# Patient Record
Sex: Female | Born: 1935
Health system: Southern US, Community
[De-identification: ages and names within clinical notes are randomized; demographics above are authoritative.]

## PROBLEM LIST (undated history)

## (undated) DIAGNOSIS — I10 Essential (primary) hypertension: Secondary | ICD-10-CM

## (undated) DIAGNOSIS — J189 Pneumonia, unspecified organism: Secondary | ICD-10-CM

## (undated) DIAGNOSIS — L509 Urticaria, unspecified: Secondary | ICD-10-CM

## (undated) DIAGNOSIS — M1990 Unspecified osteoarthritis, unspecified site: Secondary | ICD-10-CM

## (undated) DIAGNOSIS — Z8669 Personal history of other diseases of the nervous system and sense organs: Secondary | ICD-10-CM

## (undated) DIAGNOSIS — K589 Irritable bowel syndrome without diarrhea: Secondary | ICD-10-CM

## (undated) DIAGNOSIS — Z8719 Personal history of other diseases of the digestive system: Secondary | ICD-10-CM

## (undated) DIAGNOSIS — K5790 Diverticulosis of intestine, part unspecified, without perforation or abscess without bleeding: Secondary | ICD-10-CM

## (undated) DIAGNOSIS — D649 Anemia, unspecified: Secondary | ICD-10-CM

## (undated) DIAGNOSIS — K2270 Barrett's esophagus without dysplasia: Secondary | ICD-10-CM

## (undated) DIAGNOSIS — F419 Anxiety disorder, unspecified: Secondary | ICD-10-CM

## (undated) DIAGNOSIS — K222 Esophageal obstruction: Secondary | ICD-10-CM

## (undated) DIAGNOSIS — K219 Gastro-esophageal reflux disease without esophagitis: Secondary | ICD-10-CM

## (undated) DIAGNOSIS — Z9889 Other specified postprocedural states: Secondary | ICD-10-CM

## (undated) DIAGNOSIS — E785 Hyperlipidemia, unspecified: Secondary | ICD-10-CM

## (undated) DIAGNOSIS — N809 Endometriosis, unspecified: Secondary | ICD-10-CM

## (undated) DIAGNOSIS — K635 Polyp of colon: Secondary | ICD-10-CM

## (undated) HISTORY — DX: Unspecified osteoarthritis, unspecified site: M19.90

## (undated) HISTORY — DX: Hyperlipidemia, unspecified: E78.5

## (undated) HISTORY — PX: CATARACT EXTRACTION W/ INTRAOCULAR LENS IMPLANT: SHX1309

## (undated) HISTORY — DX: Urticaria, unspecified: L50.9

## (undated) HISTORY — PX: TOTAL ABDOMINAL HYSTERECTOMY: SHX209

## (undated) HISTORY — DX: Esophageal obstruction: K22.2

## (undated) HISTORY — PX: TONSILLECTOMY: SUR1361

## (undated) HISTORY — PX: COLONOSCOPY: SHX174

## (undated) HISTORY — DX: Gastro-esophageal reflux disease without esophagitis: K21.9

## (undated) HISTORY — PX: TONSILLECTOMY: SHX5217

## (undated) HISTORY — PX: BREAST BIOPSY: SHX20

## (undated) HISTORY — DX: Irritable bowel syndrome, unspecified: K58.9

## (undated) HISTORY — DX: Endometriosis, unspecified: N80.9

## (undated) HISTORY — PX: PARTIAL HYSTERECTOMY: SHX80

## (undated) HISTORY — PX: KNEE SURGERY: SHX244

## (undated) HISTORY — PX: HEMORROIDECTOMY: SUR656

## (undated) HISTORY — DX: Barrett's esophagus without dysplasia: K22.70

## (undated) HISTORY — PX: ADENOIDECTOMY: SUR15

## (undated) HISTORY — DX: Other specified postprocedural states: Z98.890

## (undated) HISTORY — PX: FOOT SURGERY: SHX648

## (undated) HISTORY — DX: Diverticulosis of intestine, part unspecified, without perforation or abscess without bleeding: K57.90

## (undated) HISTORY — DX: Personal history of other diseases of the digestive system: Z87.19

## (undated) HISTORY — DX: Polyp of colon: K63.5

## (undated) HISTORY — PX: ESOPHAGOGASTRODUODENOSCOPY ENDOSCOPY: SHX5814

## (undated) HISTORY — DX: Essential (primary) hypertension: I10

---
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
## 1983-05-28 DIAGNOSIS — K635 Polyp of colon: Secondary | ICD-10-CM

## 1983-05-28 HISTORY — DX: Polyp of colon: K63.5

## 2000-06-14 ENCOUNTER — Ambulatory Visit (HOSPITAL_COMMUNITY): Admission: RE | Admit: 2000-06-14 | Discharge: 2000-06-14 | Payer: Self-pay | Admitting: Neurological Surgery

## 2000-06-14 ENCOUNTER — Encounter: Payer: Self-pay | Admitting: Neurological Surgery

## 2000-06-24 ENCOUNTER — Encounter: Admission: RE | Admit: 2000-06-24 | Discharge: 2000-07-30 | Payer: Self-pay | Admitting: Neurological Surgery

## 2000-06-30 ENCOUNTER — Other Ambulatory Visit: Admission: RE | Admit: 2000-06-30 | Discharge: 2000-06-30 | Payer: Self-pay | Admitting: Obstetrics and Gynecology

## 2003-03-21 ENCOUNTER — Encounter: Payer: Self-pay | Admitting: Neurological Surgery

## 2003-03-21 ENCOUNTER — Ambulatory Visit (HOSPITAL_COMMUNITY): Admission: RE | Admit: 2003-03-21 | Discharge: 2003-03-21 | Payer: Self-pay | Admitting: Hematology and Oncology

## 2004-01-16 ENCOUNTER — Encounter: Payer: Self-pay | Admitting: Gastroenterology

## 2004-09-06 ENCOUNTER — Encounter: Admission: RE | Admit: 2004-09-06 | Discharge: 2004-09-06 | Payer: Self-pay | Admitting: Neurological Surgery

## 2004-09-27 ENCOUNTER — Encounter: Admission: RE | Admit: 2004-09-27 | Discharge: 2004-09-27 | Payer: Self-pay | Admitting: Neurological Surgery

## 2004-09-28 ENCOUNTER — Encounter: Admission: RE | Admit: 2004-09-28 | Discharge: 2004-09-28 | Payer: Self-pay | Admitting: Neurological Surgery

## 2004-10-09 ENCOUNTER — Ambulatory Visit (HOSPITAL_COMMUNITY): Admission: RE | Admit: 2004-10-09 | Discharge: 2004-10-09 | Payer: Self-pay | Admitting: Neurological Surgery

## 2005-10-11 ENCOUNTER — Emergency Department (HOSPITAL_COMMUNITY): Admission: EM | Admit: 2005-10-11 | Discharge: 2005-10-11 | Payer: Self-pay | Admitting: Emergency Medicine

## 2005-10-11 ENCOUNTER — Encounter (INDEPENDENT_AMBULATORY_CARE_PROVIDER_SITE_OTHER): Payer: Self-pay | Admitting: *Deleted

## 2005-12-24 ENCOUNTER — Ambulatory Visit: Payer: Self-pay | Admitting: Gastroenterology

## 2005-12-27 ENCOUNTER — Ambulatory Visit: Payer: Self-pay | Admitting: Gastroenterology

## 2006-01-06 ENCOUNTER — Encounter: Payer: Self-pay | Admitting: Gastroenterology

## 2006-01-06 ENCOUNTER — Ambulatory Visit: Payer: Self-pay | Admitting: Gastroenterology

## 2006-01-31 ENCOUNTER — Ambulatory Visit: Payer: Self-pay | Admitting: Gastroenterology

## 2007-11-19 DIAGNOSIS — K2970 Gastritis, unspecified, without bleeding: Secondary | ICD-10-CM | POA: Insufficient documentation

## 2007-11-19 DIAGNOSIS — M1710 Unilateral primary osteoarthritis, unspecified knee: Secondary | ICD-10-CM | POA: Insufficient documentation

## 2007-11-19 DIAGNOSIS — K5730 Diverticulosis of large intestine without perforation or abscess without bleeding: Secondary | ICD-10-CM | POA: Insufficient documentation

## 2007-11-19 DIAGNOSIS — E785 Hyperlipidemia, unspecified: Secondary | ICD-10-CM | POA: Insufficient documentation

## 2007-11-19 DIAGNOSIS — K2990 Gastroduodenitis, unspecified, without bleeding: Secondary | ICD-10-CM

## 2007-11-19 DIAGNOSIS — M1990 Unspecified osteoarthritis, unspecified site: Secondary | ICD-10-CM | POA: Insufficient documentation

## 2007-11-19 DIAGNOSIS — K222 Esophageal obstruction: Secondary | ICD-10-CM | POA: Insufficient documentation

## 2007-11-19 DIAGNOSIS — M179 Osteoarthritis of knee, unspecified: Secondary | ICD-10-CM | POA: Insufficient documentation

## 2007-11-19 DIAGNOSIS — I10 Essential (primary) hypertension: Secondary | ICD-10-CM | POA: Insufficient documentation

## 2007-11-20 ENCOUNTER — Ambulatory Visit: Payer: Self-pay | Admitting: Gastroenterology

## 2007-11-20 DIAGNOSIS — R1319 Other dysphagia: Secondary | ICD-10-CM | POA: Insufficient documentation

## 2007-11-20 DIAGNOSIS — K219 Gastro-esophageal reflux disease without esophagitis: Secondary | ICD-10-CM | POA: Insufficient documentation

## 2007-11-25 ENCOUNTER — Ambulatory Visit: Payer: Self-pay | Admitting: Gastroenterology

## 2007-12-28 ENCOUNTER — Telehealth: Payer: Self-pay | Admitting: Gastroenterology

## 2007-12-30 ENCOUNTER — Telehealth: Payer: Self-pay | Admitting: Gastroenterology

## 2008-01-12 ENCOUNTER — Ambulatory Visit: Payer: Self-pay | Admitting: Gastroenterology

## 2008-01-13 ENCOUNTER — Encounter: Payer: Self-pay | Admitting: Gastroenterology

## 2008-01-19 ENCOUNTER — Ambulatory Visit (HOSPITAL_COMMUNITY): Admission: RE | Admit: 2008-01-19 | Discharge: 2008-01-19 | Payer: Self-pay | Admitting: Gastroenterology

## 2008-03-03 ENCOUNTER — Ambulatory Visit (HOSPITAL_COMMUNITY): Admission: RE | Admit: 2008-03-03 | Discharge: 2008-03-03 | Payer: Self-pay | Admitting: Anesthesiology

## 2008-12-06 ENCOUNTER — Encounter (INDEPENDENT_AMBULATORY_CARE_PROVIDER_SITE_OTHER): Payer: Self-pay | Admitting: *Deleted

## 2009-01-10 ENCOUNTER — Ambulatory Visit: Payer: Self-pay | Admitting: Gastroenterology

## 2009-01-10 DIAGNOSIS — K5900 Constipation, unspecified: Secondary | ICD-10-CM | POA: Insufficient documentation

## 2009-01-10 DIAGNOSIS — K2270 Barrett's esophagus without dysplasia: Secondary | ICD-10-CM | POA: Insufficient documentation

## 2009-01-10 LAB — CONVERTED CEMR LAB
ALT: 22 units/L (ref 0–35)
AST: 27 units/L (ref 0–37)
Albumin: 3.7 g/dL (ref 3.5–5.2)
Alkaline Phosphatase: 65 units/L (ref 39–117)
BUN: 15 mg/dL (ref 6–23)
Basophils Absolute: 0 10*3/uL (ref 0.0–0.1)
Basophils Relative: 0.5 % (ref 0.0–3.0)
Bilirubin, Direct: 0.1 mg/dL (ref 0.0–0.3)
CO2: 36 meq/L — ABNORMAL HIGH (ref 19–32)
Calcium: 9.1 mg/dL (ref 8.4–10.5)
Chloride: 103 meq/L (ref 96–112)
Creatinine, Ser: 0.6 mg/dL (ref 0.4–1.2)
Eosinophils Absolute: 0.2 10*3/uL (ref 0.0–0.7)
Eosinophils Relative: 4.8 % (ref 0.0–5.0)
Ferritin: 15.4 ng/mL (ref 10.0–291.0)
Folate: >20 ng/mL
GFR calc non Af Amer: 104.56 mL/min (ref >60)
Glucose, Bld: 84 mg/dL (ref 70–99)
HCT: 37.9 % (ref 36.0–46.0)
Hemoglobin: 13.1 g/dL (ref 12.0–15.0)
Iron: 88 ug/dL (ref 42–145)
Lymphocytes Relative: 36.6 % (ref 12.0–46.0)
Lymphs Abs: 1.8 10*3/uL (ref 0.7–4.0)
MCHC: 34.5 g/dL (ref 30.0–36.0)
MCV: 91 fL (ref 78.0–100.0)
Monocytes Absolute: 0.5 10*3/uL (ref 0.1–1.0)
Monocytes Relative: 10.9 % (ref 3.0–12.0)
Neutro Abs: 2.4 10*3/uL (ref 1.4–7.7)
Neutrophils Relative %: 47.2 % (ref 43.0–77.0)
Platelets: 210 10*3/uL (ref 150.0–400.0)
Potassium: 3.8 meq/L (ref 3.5–5.1)
RBC: 4.16 M/uL (ref 3.87–5.11)
RDW: 12.1 % (ref 11.5–14.6)
Saturation Ratios: 25.4 % (ref 20.0–50.0)
Sed Rate: 34 mm/hr — ABNORMAL HIGH (ref 0–22)
Sodium: 143 meq/L (ref 135–145)
TSH: 1.78 microintl units/mL (ref 0.35–5.50)
Tissue Transglutaminase Ab, IgA: 0.3 units (ref <7)
Total Bilirubin: 0.6 mg/dL (ref 0.3–1.2)
Total Protein: 7.3 g/dL (ref 6.0–8.3)
Transferrin: 247.4 mg/dL (ref 212.0–360.0)
Vitamin B-12: >1500 pg/mL — ABNORMAL HIGH (ref 211–911)
WBC: 4.9 10*3/uL (ref 4.5–10.5)

## 2009-01-23 ENCOUNTER — Ambulatory Visit: Payer: Self-pay | Admitting: Gastroenterology

## 2009-01-26 ENCOUNTER — Telehealth: Payer: Self-pay | Admitting: Gastroenterology

## 2009-02-08 ENCOUNTER — Encounter: Payer: Self-pay | Admitting: Gastroenterology

## 2009-03-01 ENCOUNTER — Encounter: Payer: Self-pay | Admitting: Gastroenterology

## 2009-11-17 ENCOUNTER — Ambulatory Visit (HOSPITAL_COMMUNITY): Admission: RE | Admit: 2009-11-17 | Discharge: 2009-11-17 | Payer: Self-pay | Admitting: Neurological Surgery

## 2010-03-06 ENCOUNTER — Encounter: Payer: Self-pay | Admitting: Gastroenterology

## 2010-04-16 ENCOUNTER — Emergency Department (HOSPITAL_COMMUNITY)
Admission: EM | Admit: 2010-04-16 | Discharge: 2010-04-16 | Payer: Self-pay | Source: Home / Self Care | Admitting: Internal Medicine

## 2010-04-16 ENCOUNTER — Encounter (INDEPENDENT_AMBULATORY_CARE_PROVIDER_SITE_OTHER): Payer: Self-pay | Admitting: *Deleted

## 2010-04-18 ENCOUNTER — Encounter: Payer: Self-pay | Admitting: Gastroenterology

## 2010-05-15 ENCOUNTER — Ambulatory Visit: Payer: Self-pay | Admitting: Gastroenterology

## 2010-05-16 ENCOUNTER — Ambulatory Visit: Payer: Self-pay | Admitting: Gastroenterology

## 2010-05-24 LAB — CONVERTED CEMR LAB
CO2: 33 meq/L — ABNORMAL HIGH (ref 19–32)
Chloride: 96 meq/L (ref 96–112)
Potassium: 4 meq/L (ref 3.5–5.1)
Sodium: 138 meq/L (ref 135–145)

## 2010-05-25 ENCOUNTER — Telehealth: Payer: Self-pay | Admitting: Gastroenterology

## 2010-05-25 DIAGNOSIS — R110 Nausea: Secondary | ICD-10-CM | POA: Insufficient documentation

## 2010-06-01 ENCOUNTER — Encounter (INDEPENDENT_AMBULATORY_CARE_PROVIDER_SITE_OTHER): Payer: Self-pay | Admitting: *Deleted

## 2010-06-04 ENCOUNTER — Ambulatory Visit (HOSPITAL_COMMUNITY)
Admission: RE | Admit: 2010-06-04 | Discharge: 2010-06-04 | Payer: Self-pay | Source: Home / Self Care | Attending: Gastroenterology | Admitting: Gastroenterology

## 2010-06-04 ENCOUNTER — Ambulatory Visit
Admission: RE | Admit: 2010-06-04 | Discharge: 2010-06-04 | Payer: Self-pay | Source: Home / Self Care | Attending: Gastroenterology | Admitting: Gastroenterology

## 2010-06-05 ENCOUNTER — Telehealth: Payer: Self-pay | Admitting: Gastroenterology

## 2010-06-13 ENCOUNTER — Ambulatory Visit
Admission: RE | Admit: 2010-06-13 | Discharge: 2010-06-13 | Payer: Self-pay | Source: Home / Self Care | Attending: Gastroenterology | Admitting: Gastroenterology

## 2010-06-13 ENCOUNTER — Other Ambulatory Visit: Payer: Self-pay | Admitting: Gastroenterology

## 2010-06-13 ENCOUNTER — Encounter: Payer: Self-pay | Admitting: Gastroenterology

## 2010-06-15 ENCOUNTER — Telehealth: Payer: Self-pay | Admitting: Gastroenterology

## 2010-06-16 ENCOUNTER — Encounter: Payer: Self-pay | Admitting: Neurological Surgery

## 2010-06-17 ENCOUNTER — Encounter: Payer: Self-pay | Admitting: Neurological Surgery

## 2010-06-19 ENCOUNTER — Encounter: Payer: Self-pay | Admitting: Gastroenterology

## 2010-06-26 NOTE — Medication Information (Signed)
Summary: Approval for Rx Kapidex/[facility]  Approval for Rx Kapidex/[facility]   Imported By: Esmeralda Links D'jimraou 01/19/2008 11:25:11  _____________________________________________________________________  External Attachment:    Type:   Image     Comment:   External Document

## 2010-06-26 NOTE — Medication Information (Signed)
Summary: P Auth  [facility]  P Auth  [facility]   Imported By: Lester Selma 03/06/2009 10:36:33  _____________________________________________________________________  External Attachment:    Type:   Image     Comment:   External Document

## 2010-06-26 NOTE — Assessment & Plan Note (Signed)
Summary: lower esophagus pain/lk   History of Present Illness Visit Type: follow up Primary GI MD: Sheryn Bison MD Faith Rogue Primary Provider: Demaris Callander Requesting Provider: n/a Chief Complaint: follow-up visit from changing medication from omeprazole to kapidex History of Present Illness:   Cheryl Hinton had endoscopy and dilation performed for possible stricture versus office. Also noted was an" inlet patch" in the upper esophagus. There is no evidence for Barrett's mucosa. After dilation she said she was" worse" but is now getting better on daily kapidex 60 mg. She continues with a globus sensation in throat and intermittent solid food dysphagia.   GI Review of Systems    Reports chest pain, dysphagia with liquids, and  dysphagia with solids.      Denies abdominal pain, acid reflux, belching, bloating, heartburn, loss of appetite, nausea, vomiting, vomiting blood, weight loss, and  weight gain.        Denies anal fissure, black tarry stools, change in bowel habit, constipation, diarrhea, diverticulosis, fecal incontinence, heme positive stool, hemorrhoids, irritable bowel syndrome, jaundice, light color stool, liver problems, rectal bleeding, and  rectal pain.     Prior Medications Reviewed Using: Patient Recall  Updated Prior Medication List: LISINOPRIL-HYDROCHLOROTHIAZIDE 20-25 MG  TABS (LISINOPRIL-HYDROCHLOROTHIAZIDE) one tablet once a day ATENOLOL 25 MG  TABS (ATENOLOL) 1/2 tablet by mouth once daily PREMARIN 0.3 MG  TABS (ESTROGENS CONJUGATED) one by mouth once daily RED YEAST RICE 600 MG  CAPS (RED YEAST RICE EXTRACT) one by mouth once daily GLUCOSAMINE 1500 COMPLEX   CAPS (GLUCOSAMINE-CHONDROIT-VIT C-MN) one by mouth once daily HYDROXYZINE HCL 10 MG  TABS (HYDROXYZINE HCL) one by mouth once daily KAPIDEX 60 MG  CPDR (DEXLANSOPRAZOLE) Take one capsule by mouth 30 minutes before breakfast K-TABS 10 MEQ  TBCR (POTASSIUM CHLORIDE) one tablet once a day  Current Allergies  (reviewed today): ! VIBRAMYCIN ! IODINE  Past Medical History:    Reviewed history from 11/20/2007 and no changes required:       Current Problems:        HYPERLIPIDEMIA (ICD-272.4)       DEGENERATIVE JOINT DISEASE (ICD-715.90)       HYPERTENSION (ICD-401.9)       DIVERTICULOSIS, COLON (ICD-562.10)       ESOPHAGEAL STRICTURE (ICD-530.3)       GASTRITIS (ICD-535.50)         Past Surgical History:    Reviewed history from 11/20/2007 and no changes required:       Appendectomy       Hysterectomy       laparotomy       hemorrhoidectomy       removal of bladder polyps       3 knee surgerys       2 foot surgerys   Family History:    Reviewed history from 11/20/2007 and no changes required:       Family History of Colon Cancer: mother age 62       Family History of Diabetes: mother       Family History of Heart Disease: father  Social History:    Reviewed history from 11/20/2007 and no changes required:       Patient has never smoked.        Alcohol Use - no       Illicit Drug Use - no       Patient gets regular exercise.       Daily Caffeine Use    Review of Systems  The patient  denies allergy/sinus, anemia, anxiety-new, arthritis/joint pain, back pain, blood in urine, breast changes/lumps, change in vision, confusion, cough, coughing up blood, depression-new, fainting, fatigue, fever, headaches-new, hearing problems, heart murmur, heart rhythm changes, itching, menstrual pain, muscle pains/cramps, night sweats, nosebleeds, pregnancy symptoms, shortness of breath, skin rash, sleeping problems, sore throat, swelling of feet/legs, swollen lymph glands, thirst - excessive , urination - excessive , urination changes/pain, urine leakage, vision changes, and voice change.     Vital Signs:  Patient Profile:   75 Years Old Female Height:     63.5 inches Weight:      172.13 pounds BMI:     30.12 Pulse rate:   80 / minute Pulse rhythm:   regular BP sitting:   120 / 60  (left  arm)  Vitals Entered By: Merri Ray CMA (January 12, 2008 10:08 AM)                  Physical Exam  General:     Well developed, well nourished, no acute distress.healthy appearing.      Impression & Recommendations:  Problem # 1:  DYSPHAGIA (DGU-440.34) Assessment: Unchanged I have ordered a high-speed camera exam of her neuromuscular swallowing and also a barium esophagogram to better define etiology of her problems. I suspect that her heterotropic patch of gastric mucosa in her upper esophagus accounts for a lot of her problems. She may need esophageal manometry depending on her barium studies report. In the interim, she is to continue kapidex daily and standard antireflux regime. Orders: Barium Swallow with CINE (BS with CINE)    Patient Instructions: 1)  Copy Sent To:Dr. Duane Lope. 2)  Please Continue current medications. 3)  Please schedule a follow-up appointment as needed.    Prescriptions: KAPIDEX 60 MG  CPDR (DEXLANSOPRAZOLE) Take one capsule by mouth 30 minutes before breakfast  #30 x 11   Entered by:   Harlow Mares CMA   Authorized by:   Mardella Layman MD FACG,FAGA   Signed by:   Harlow Mares CMA on 01/12/2008   Method used:   [facility] ...       [facility]  [address] [phone]*       [address]       [place], [place]  [address]       Ph: [phone] or [phone]       Fax: [phone]   RxID:   [id number]  ]

## 2010-06-26 NOTE — Progress Notes (Signed)
Summary: triage  Phone Note Call from Patient Call back at Home Phone [phone]   Caller: Patient Call For: patterson Reason for Call: Talk to Nurse Summary of Call: pt states that she had egd on 7-1 but she is still having pains in the bottom of her stomach Initial call taken by: Tawni Levy,  December 28, 2007 1:30 PM  Follow-up for Phone Call        Dr. Russella Dar you are DOD  since Egd with Dil pain in lower Esophagus, states that she can not even wear her pants she is staying in house coat and she has alot more pain after she finishes a meal, pt only takes omperazole 40mg  one by mouth once daily. pt questions if she can take something eles for the pain and burning. Advised ehr I would call her back after I speak with DOD. Follow-up by: Harlow Mares CMA,  December 28, 2007 1:49 PM  Additional Follow-up for Phone Call Additional follow up Details #1::        Trial of Kapidex 60mg  qam for 4 weeks in place of omeprazole ROV with Dr. Jarold Motto Additional Follow-up by: Meryl Dare MD Clementeen Graham,  December 28, 2007 2:14 PM    Additional Follow-up for Phone Call Additional follow up Details #2::    samples up front for pt kapidex 60 mg one by mouth once daily. made appt for pt on 01-12-08 9:45am. Follow-up by: Harlow Mares CMA,  December 28, 2007 2:22 PM

## 2010-06-26 NOTE — Assessment & Plan Note (Signed)
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 She is on chronic PPI therapy. She's had progressive solid food dysphagia since having a potassium tablet lodged in her esophagus several months ago. Her last endoscopic exam was done 2 years ago at which time she was dilated after being in the emergency room at the mid impaction. She has a family history of colon Carcinomas and is  due for followup colonoscopy in one years time. She has diverticulosis and has nonspecific abdominal gas and bloating and mild constipation.She denies rectal bleeding,anorexia or weight loss. She continues on potassium replacement 10 mg twice a day.   GI Review of Systems    Reports acid reflux, belching, chest pain, dysphagia with solids, and  heartburn.      Denies loss of appetite, vomiting blood, and  weight loss.      Reports constipation, diverticulosis, and  irritable bowel syndrome.     Denies anal fissure, black tarry stools, change in bowel habit, diarrhea, fecal incontinence, heme positive stool, hemorrhoids, jaundice, light color stool, liver problems, rectal bleeding, and  rectal pain.     Prior Medications Reviewed Using: Patient Recall  Updated Prior Medication List: LISINOPRIL-HYDROCHLOROTHIAZIDE 20-25 MG  TABS (LISINOPRIL-HYDROCHLOROTHIAZIDE) one tablet once a day ATENOLOL 25 MG  TABS (ATENOLOL) 1/2 tablet by mouth once daily PREMARIN 0.3 MG  TABS (ESTROGENS CONJUGATED) one by mouth once daily RED YEAST RICE 600 MG  CAPS (RED YEAST RICE EXTRACT) one by mouth once daily GLUCOSAMINE 1500 COMPLEX   CAPS (GLUCOSAMINE-CHONDROIT-VIT C-MN) one by mouth once daily HYDROXYZINE HCL 10  MG  TABS (HYDROXYZINE HCL) one by mouth once daily OMEPRAZOLE 40 MG  CPDR (OMEPRAZOLE) one tablet one a day K-TABS 10 MEQ  TBCR (POTASSIUM CHLORIDE) one tablet once a day  Current Allergies (reviewed today): ! VIBRAMYCIN ! IODINE  Past Medical History:    Reviewed history and no changes required:       Current Problems:        HYPERLIPIDEMIA (ICD-272.4)       DEGENERATIVE JOINT DISEASE (ICD-715.90)       HYPERTENSION (ICD-401.9)       DIVERTICULOSIS, COLON (ICD-562.10)       ESOPHAGEAL STRICTURE (ICD-530.3)       GASTRITIS (ICD-535.50)         Past Surgical History:    Appendectomy    Hysterectomy    laparotomy    hemorrhoidectomy    removal of bladder polyps    3 knee surgerys    2 foot surgerys   Family History:    Reviewed history and no changes required:       Family History of Colon Cancer: mother age 76       Family History of Diabetes: mother       Family History of Heart Disease: father  Social History:    Reviewed history and no changes required:       Patient has never smoked.        Alcohol Use - no       Illicit Drug Use - no       Patient gets regular exercise.       Daily Caffeine Use  Risk Factors:  Tobacco use:  never Drug use:  no Alcohol use:  no Exercise:  yes    Vital Signs:  Patient Profile:   75 Years Old Female Height:     63.5 inches Weight:      172.38 pounds BMI:     30.17 Pulse rate:   60 / minute Pulse rhythm:   regular BP sitting:   122 / 80  (left arm)  Vitals Entered By: Merri Ray CMA (November 20, 2007 11:21 AM)                  Physical Exam  General:     Well developed, well nourished, no acute distress.healthy appearing.   Head:     Normocephalic and atraumatic. Eyes:     PERRLA, no icterus.exam deferred to patient's ophthalmologist.   Mouth:     No deformity or lesions, dentition normal.She has a blue venous bleb on her lower lip area, otherwise normal exam. Neck:     Supple; no masses or  thyromegaly. Lungs:     Clear throughout to auscultation. Heart:     Regular rate and rhythm; no murmurs, rubs,  or bruits. Abdomen:     Soft, nontender and nondistended. No masses, hepatosplenomegaly or hernias noted. Normal bowel sounds. Msk:     Symmetrical with no gross deformities. Normal posture. Extremities:     No clubbing, cyanosis, edema or deformities noted. Neurologic:     Alert and  oriented x4;  grossly normal neurologically. Skin:     Intact without significant lesions or rashes. Psych:     Alert and cooperative. Normal mood and affect.    Impression & Recommendations:  Problem # 1:  DYSPHAGIA (ZOX-096.04) Assessment: Deteriorated Her is consistent with a peptic stricture resolve because exacerbated recently by chemical injury to esophagus from a potassium tablet which became stuck and calls severe dysphasia and painful swallowing for several weeks. She is taking a PPI therapy regularly and has chronic GERD. I scheduled a repeat endoscopy esophageal dilatation at her convenience. Orders: EGD SAV (EGD SAV)   Problem # 2:  GERD (ICD-530.81) Assessment: Deteriorated dictated omeprazole 40 mg a day lovastatin at the reflux maneuvers. Orders: EGD SAV (EGD SAV)   Problem # 3:  DIVERTICULOSIS, COLON (ICD-562.10) Assessment: Deteriorated high-fiber diet and fiber supplements with liberal p.o. fluid suggested.  Problem # 4:  FAMILY HX COLON CANCER (ICD-V16.0) Assessment: Unchanged followup colonoscopy in one years time as previously scheduled for a surveillance followup program.   Patient Instructions: 1)  Copy Sent To:Dr. Duane Lope. 2)  Diet should be high in fiber (fruits, vegetables, whole grains) but low in residue. Drink at least eight (8) glasses of water a day. 3)  GI reflux and Hiatal Hernia brochure given. 4)  Upper Endoscopy with Dilatation brochure given. 5)   Endoscopy Center Patient Information Guide given to patient.    ]

## 2010-06-26 NOTE — Procedures (Signed)
Summary: EGD   EGD  Procedure date:  11/25/2007  Findings:      Location: [facility]    Patient Name: Cheryl Hinton, Cheryl Hinton MRN:  Procedure Procedures: Panendoscopy (EGD) CPT: 43235.    with esophageal dilation. CPT: G9296129.  Personnel: Endoscopist: Vania Rea. Jarold Motto, MD.  Exam Location: Exam performed in Outpatient Clinic. Outpatient  Patient Consent: Procedure, Alternatives, Risks and Benefits discussed, consent obtained, from patient. Consent was obtained by the RN.  Indications Symptoms: Dysphagia.  History  Current Medications: Patient is not currently taking Coumadin.  Medical/Surgical History: Hypertension,  Pre-Exam Physical: Performed Nov 25, 2007  Cardio-pulmonary exam, Abdominal exam, Extremity exam, Mental status exam WNL.  Comments: Pt. history reviewed/updated, physical exam performed prior to initiation of sedation? yes Exam Exam Info: Maximum depth of insertion Duodenum, intended Duodenum. Patient position: on left side. Duration of exam: 10 minutes. Vocal cords visualized. Gastric retroflexion performed. Images taken. ASA Classification: II. Tolerance: excellent.  Sedation Meds: Patient assessed and found to be appropriate for moderate (conscious) sedation. Sedation was managed by the Endoscopist. Fentanyl 50 mcg. given IV. Versed given IV. Cetacaine Spray 2 sprays given aerosolized.  Monitoring: BP and pulse monitoring done. Oximetry used. Supplemental O2 given at 2 Liters.  Instrument(s): GIF 160. Serial [id number].   Findings - OTHER FINDING: in Proximal Esophagus. Comments: Small "inlet pouch" noted....  - HIATAL HERNIA: Prolapsing, 2 cms. in length. ICD9: Hernia, Hiatal: 553.3. - STRICTURE / STENOSIS: Distal Esophagus.  Constriction: partial. Lumen diameter is 14 mm. ICD9: Esophageal Stricture: 530.3.  - Dilation: Distal Esophagus. for esophageal stricture. Maloney dilator used, Diameter: 52 F, Minimal Resistance, No  Heme present on extraction. 1  total dilators used. Patient tolerance excellent. Outcome: successful.  - Normal: Fundus to Antrum. Not Seen: Tumor. Mucosal abnormality. Foreign body. Polyp. Varices.  - Normal: Duodenal Bulb to Duodenal 2nd Portion. Ulcer. Mucosal abnormality.   Assessment  Diagnoses: 530.3: Esophageal Stricture. GERD.  553.3: Hernia, Hiatal.   Events  Unplanned Intervention: No unplanned interventions were required.  Plans Medication(s): Continue current medications.  Patient Education: Patient given standard instructions for: Reflux. Stenosis / Stricture.  Disposition: After procedure patient sent to recovery. After recovery patient sent home.  Comments: Manometry needed if her dysphagia persists....  cc: Miguel Aschoff M.D.  This report was created from the original endoscopy report, which was reviewed and signed by the above listed endoscopist.

## 2010-06-26 NOTE — Procedures (Signed)
Summary: EGD   EGD  Procedure date:  01/23/2009  Findings:      Location: [facility]   ENDOSCOPY PROCEDURE REPORT  PATIENT:  Cheryl Hinton, Cheryl Hinton  MR#:  [id number] BIRTHDATE:   1936-04-19, 73 yrs. old   GENDER:   female  ENDOSCOPIST:   Vania Rea. Jarold Motto, MD, [facility] Referred by:   PROCEDURE DATE:  01/23/2009 PROCEDURE:  EGD, diagnostic ASA CLASS:   Class II INDICATIONS: GERD   MEDICATIONS:    Versed 3 mg IV TOPICAL ANESTHETIC:   Exactacain Spray  DESCRIPTION OF PROCEDURE:   After the risks benefits and alternatives of the procedure were thoroughly explained, informed consent was obtained.  The LB GIF-H180 [id number] endoscope was introduced through the mouth and advanced to the second portion of the duodenum, without limitations.  The instrument was slowly withdrawn as the mucosa was fully examined. <<PROCEDUREIMAGES>>  <<OLD IMAGES>>  The upper, middle, and distal third of the esophagus were carefully inspected and no abnormalities were noted. The z-line was well seen at the GEJ. The endoscope was pushed into the fundus which was normal including a retroflexed view. The antrum,gastric body, first and second part of the duodenum were unremarkable. NO BARRETT'S OR ESOPHAGITIS.    Retroflexed views revealed no abnormalities.    The scope was then withdrawn from the patient and the procedure completed.  COMPLICATIONS:   None  ENDOSCOPIC IMPRESSION:  1) Normal EGD RECOMMENDATIONS:  1) continue current meds  REPEAT EXAM:   No   _______________________________ Vania Rea. Jarold Motto, MD, [facility]    CC: Duane Lope, MD

## 2010-06-26 NOTE — Procedures (Signed)
Summary: Colonoscopy   Colonoscopy  Procedure date:  01/23/2009  Findings:      Location:  [facility].   COLONOSCOPY PROCEDURE REPORT  PATIENT:  Cheryl Hinton, Cheryl Hinton  MR#:  [id number] BIRTHDATE:   [birth year], 73 yrs. old   GENDER:   female  ENDOSCOPIST:   Vania Rea. Jarold Motto, MD, [place] Referred by:   PROCEDURE DATE:  01/23/2009 PROCEDURE:  Colonoscopy, diagnostic ASA CLASS:   Class II INDICATIONS: Elevated Risk Screening ++ FH OF COLON CA.  MEDICATIONS:    Fentanyl 75 mcg IV, Versed 7 mg IV  DESCRIPTION OF PROCEDURE:   After the risks benefits and alternatives of the procedure were thoroughly explained, informed consent was obtained.  Digital rectal exam was performed and revealed no abnormalities.   The LB CF-H180AL [id number] endoscope was introduced through the anus and advanced to the cecum, which was identified by both the appendix and ileocecal valve, limited by a tortuous and redundant colon.    The quality of the prep was poor, using MoviPrep.  The instrument was then slowly withdrawn as the colon was fully examined. <<PROCEDUREIMAGES>>    <<OLD IMAGES>>  FINDINGS:  No polyps or cancers were seen.  This was otherwise a normal examination of the colon. Adhesions and "tight" sigmoid area.   Retroflexed views in the rectum revealed no abnormalities.    The scope was then withdrawn from the patient and the procedure completed.  COMPLICATIONS:   None  ENDOSCOPIC IMPRESSION:  1) No polyps or cancers  2) Otherwise normal examination  CHRONIC FUNCTIONAL CONSTIPATION. RECOMMENDATIONS:  1) continue current medications  REPEAT EXAM:   No   _______________________________ Vania Rea. Jarold Motto, MD, [facility]  CC: Duane Lope, MD

## 2010-06-26 NOTE — Progress Notes (Signed)
Summary: Refill  Phone Note Call from Patient Call back at Home Phone [phone]   Call For: Dr Jarold Motto Reason for Call: Refill Medication Summary of Call: Ran out of Dexilant-[facility] [address] [facility]. Initial call taken by: Leanor Kail [facility],  January 26, 2009 10:34 AM  Follow-up for Phone Call        rx sent patietn aware Follow-up by: Harlow Mares CMA (AAMA),  January 26, 2009 10:43 AM    Prescriptions: KAPIDEX 60 MG  CPDR (DEXLANSOPRAZOLE) Take one capsule by mouth 30 minutes before breakfast  #30 x 11   Entered by:   Harlow Mares CMA (AAMA)   Authorized by:   Mardella Layman MD FACG,FAGA   Signed by:   Harlow Mares CMA (AAMA) on 01/26/2009   Method used:   [facility]        [facility]  [address] [phone][facility] ([facility]ail[facility]       [address]       [place], [place]  [id number]       Ph: [phone] or [phone]       Fax: [phone]   RxID:   [id number]

## 2010-06-26 NOTE — Progress Notes (Signed)
Summary: meds  Phone Note Call from Patient Call back at Home Phone [phone]   Caller: Patient Call For: PATTERSON Reason for Call: Talk to Nurse Details for Reason: meds Summary of Call: having some discomfort wondering what she can take Initial call taken by: Guadlupe Spanish [facility],  December 30, 2007 12:15 PM  Follow-up for Phone Call        pt never picked up Kapidex 60mg  samples and I advised her to come pick up kapidex samples and call back after she has started taking the samples. Follow-up by: Harlow Mares CMA,  December 30, 2007 1:36 PM

## 2010-06-26 NOTE — Letter (Signed)
Summary: Recall Colonoscopy Letter  [facility]  [address] [place], [place] [address]   Phone: [phone]  Fax: [phone]      December 06, 2008 MRN: [id number]   DEANIA SIGUENZA [address] [place], [place]  [address]   Dear Ms. Lodema Hong,   According to your medical record, it is time for you to schedule a Colonoscopy. The American Cancer Society recommends this procedure as a method to detect early colon cancer. Patients with a family history of colon cancer, or a personal history of colon polyps or inflammatory bowel disease are at increased risk.  This letter has beeen generated based on the recommendations made at the time of your procedure. If you feel that in your particular situation this may no longer apply, please contact our office.  Please call our office at [phone] to schedule this appointment or to update your records at your earliest convenience.  Thank you for cooperating with [place] to provide you with the very best care possible.   Sincerely,  Vania Rea. Coral Spikes, M.D.  [facility] [phone]

## 2010-06-26 NOTE — Medication Information (Signed)
Summary: Dexilant approved/[facility]  Dexilant approved/[facility]   Imported By: Sherian Rein 03/08/2010 14:56:46  _____________________________________________________________________  External Attachment:    Type:   Image     Comment:   External Document

## 2010-06-26 NOTE — Assessment & Plan Note (Signed)
Summary: REC ECL//DIARRHEA...AS.   History of Present Illness Visit Type: Follow-up Visit Primary GI MD: Sheryn Bison MD Faith Rogue Primary Provider: Hessie Diener Ross,MD Requesting Provider: n/a Chief Complaint: Set-up ECL, Barrett's and constipation History of Present Illness:   This Patient is a 75 year old Caucasian female with degenerative arthritis in her back and knees requiring hydrocodone therapy. Associated with this medication his severe chronic constipation and laxative dependency. She has a family history colon cancer in her mother her last colonoscopy 5 years ago. She also has chronic acid reflux and has known Barrett's mucosa at this time is due for endoscopy and colonoscopy followups. She takes daily kapidex is asymptomatic and denies reflux symptoms or dysphagia. She denies melena or hematochezia. Her appetite is good and her weight is stable.   GI Review of Systems      Denies abdominal pain, acid reflux, belching, bloating, chest pain, dysphagia with liquids, dysphagia with solids, heartburn, loss of appetite, nausea, vomiting, vomiting blood, weight loss, and  weight gain.      Reports constipation.     Denies anal fissure, black tarry stools, change in bowel habit, diarrhea, diverticulosis, fecal incontinence, heme positive stool, hemorrhoids, irritable bowel syndrome, jaundice, light color stool, liver problems, rectal bleeding, and  rectal pain.    Current Medications (verified): 1)  Lisinopril-Hydrochlorothiazide 20-25 Mg  Tabs (Lisinopril-Hydrochlorothiazide) .... One Tablet Once A Day 2)  Atenolol 25 Mg  Tabs (Atenolol) .... 1/2 Tablet By Mouth Once Daily 3)  Premarin 0.3 Mg  Tabs (Estrogens Conjugated) .... One By Mouth Once Daily 4)  Red Yeast Rice 600 Mg  Caps (Red Yeast Rice Extract) .... One By Mouth Once Daily 5)  Glucosamine 1500 Complex   Caps (Glucosamine-Chondroit-Vit C-Mn) .... One By Mouth Once Daily 6)  Doxepin Hcl 50 Mg Caps (Doxepin Hcl) .... Take 1  Tablet At Bedtime 7)  Kapidex 60 Mg  Cpdr (Dexlansoprazole) .... Take One Capsule By Mouth 30 Minutes Before Breakfast 8)  K-Tabs 10 Meq  Tbcr (Potassium Chloride) .... One Tablet Once A Day 9)  Hydrocodone-Acetaminophen 10-325 Mg Tabs (Hydrocodone-Acetaminophen) .... Take 1 Tablet Two Times A Day For Pain 10)  Metanx 3-35-2 Mg Tabs (L-Methylfolate-B6-B12) .... As Needed  Allergies (verified): No Known Drug Allergies  Past History:  Past medical, surgical, family and social histories (including risk factors) reviewed for relevance to current acute and chronic problems.  Past Medical History: Reviewed history from 11/20/2007 and no changes required. Current Problems:  HYPERLIPIDEMIA (ICD-272.4) DEGENERATIVE JOINT DISEASE (ICD-715.90) HYPERTENSION (ICD-401.9) DIVERTICULOSIS, COLON (ICD-562.10) ESOPHAGEAL STRICTURE (ICD-530.3) GASTRITIS (ICD-535.50)  Past Surgical History: Reviewed history from 11/20/2007 and no changes required. Appendectomy Hysterectomy laparotomy hemorrhoidectomy removal of bladder polyps 3 knee surgerys 2 foot surgerys  Family History: Reviewed history from 11/20/2007 and no changes required. Family History of Colon Cancer: mother age 26 Family History of Diabetes: mother Family History of Heart Disease: father  Social History: Reviewed history from 11/20/2007 and no changes required. Patient has never smoked.  Alcohol Use - no Illicit Drug Use - no Patient gets regular exercise. Daily Caffeine Use  Review of Systems       The patient complains of allergy/sinus, arthritis/joint pain, back pain, muscle pains/cramps, swelling of feet/legs, and voice change.  The patient denies anemia, anxiety-new, blood in urine, breast changes/lumps, change in vision, confusion, cough, coughing up blood, depression-new, fainting, fatigue, fever, headaches-new, hearing problems, heart murmur, heart rhythm changes, itching, menstrual pain, night sweats, nosebleeds,  pregnancy symptoms, shortness of breath, skin rash, sleeping  problems, sore throat, swollen lymph glands, thirst - excessive , urination - excessive , urine leakage, and vision changes.    Vital Signs:  Patient profile:   75 year old female Height:      63.5 inches Weight:      164 pounds BMI:     28.70 Pulse rate:   72 / minute Pulse rhythm:   regular BP sitting:   110 / 66  (left arm) Cuff size:   regular  Vitals Entered By: June McMurray CMA Duncan Dull) (January 10, 2009 11:02 AM)  Physical Exam  General:  Well developed, well nourished, no acute distress.healthy appearing.   Head:  Normocephalic and atraumatic. Eyes:  PERRLA, no icterus.exam deferred to patient's ophthalmologist.   Neck:  Supple; no masses or thyromegaly. Lungs:  Clear throughout to auscultation. Heart:  Regular rate and rhythm; no murmurs, rubs,  or bruits. Abdomen:  Soft, nontender and nondistended. No masses, hepatosplenomegaly or hernias noted. Normal bowel sounds. Msk:  Symmetrical with no gross deformities. Normal posture.arthritic changes.   Pulses:  Normal pulses noted. Extremities:  No clubbing, cyanosis, edema or deformities noted. Neurologic:  Alert and  oriented x4;  grossly normal neurologically. Cervical Nodes:  No significant cervical adenopathy. Inguinal Nodes:  No significant inguinal adenopathy. Psych:  Alert and cooperative. Normal mood and affect.   Impression & Recommendations:  Problem # 1:  CONSTIPATION (ICD-564.00) Assessment Deteriorated MiraLax 8 ounces twice a day to decrease to once a day as tolerated for constipation Orders: TLB-CBC Platelet - w/Differential (85025-CBCD) TLB-BMP (Basic Metabolic Panel-BMET) (80048-METABOL) TLB-Hepatic/Liver Function Pnl (80076-HEPATIC) TLB-TSH (Thyroid Stimulating Hormone) (84443-TSH) TLB-B12, Serum-Total ONLY ([address]109-U04) TLB-Ferritin (82728-FER) TLB-Folic Acid (Folate) (82746-FOL) TLB-IBC Pnl (Iron/FE;Transferrin)  (83550-IBC) TLB-Sedimentation Rate (ESR) (85652-ESR) T-Sprue Panel (Celiac Disease Aby Eval) (83516x3/[address]) T-Tissue Transglutamase Ab IgA (54098-11914)  Problem # 2:  BARRETTS ESOPHAGUS (ICD-530.85) Assessment: Unchanged continue daily kapidex, reflux maneuvers, an outpatient endoscopy with dysplasia screening Orders: TLB-CBC Platelet - w/Differential (85025-CBCD) TLB-BMP (Basic Metabolic Panel-BMET) (80048-METABOL) TLB-Hepatic/Liver Function Pnl (80076-HEPATIC) TLB-TSH (Thyroid Stimulating Hormone) (84443-TSH) TLB-B12, Serum-Total ONLY (78295-A21) TLB-Ferritin (82728-FER) TLB-Folic Acid (Folate) (82746-FOL) TLB-IBC Pnl (Iron/FE;Transferrin) (83550-IBC) TLB-Sedimentation Rate (ESR) (85652-ESR) T-Sprue Panel (Celiac Disease Aby Eval) (83516x3/[address]) T-Tissue Transglutamase Ab IgA (30865-78469)  Problem # 3:  FAMILY HX COLON CANCER (ICD-V16.0) Assessment: Unchanged Colonoscopy scheduled her convenience.  Problem # 4:  DEGENERATIVE JOINT DISEASE (ICD-715.90) Assessment: Improved Continue medications per Dr. Donovan Kail.  Problem # 5:  HYPERTENSION (ICD-401.9) Blood Pressure today is 110/66. She is to continue her antihypertensive as listed and reviewed in her chart  Patient Instructions: 1)  Copy sent to : Dr. Duane Lope 2)  Please continue current medications.  3)  Avoid foods high in acid content ( tomatoes, citrus juices, spicy foods) . Avoid eating within 3 to 4 hours of lying down or before exercising. Do not over eat; try smaller more frequent meals. Elevate head of bed four inches when sleeping.  4)  Colonoscopy and Flexible Sigmoidoscopy brochure given.  5)  Conscious Sedation brochure given.  6)  Upper Endoscopy brochure given.  7)  Diet should be high in fiber ( fruits, vegetables, whole grains) but low in residue. Drink at least eight (8) glasses of water a day.  8)  MiraLax daily  Appended Document: Orders Update    Clinical Lists  Changes  Medications: Added new medication of MOVIPREP 100 GM  SOLR (PEG-KCL-NACL-NASULF-NA ASC-C) As per prep instructions. - Signed Added new medication of MIRALAX   POWD (POLYETHYLENE GLYCOL 3350) Mix  one scoop in water and drink two times a day decrease if needed Rx of MOVIPREP 100 GM  SOLR (PEG-KCL-NACL-NASULF-NA ASC-C) As per prep instructions.;  #1 x 0;  Signed;  Entered by: Harlow Mares CMA (AAMA);  Authorized by: Mardella Layman MD [facility];  Method [facility]: [facility]  [address] [phone]*, [address], [place], [place], [place]  [address], Ph: [phone] or [phone], Fax: [phone] Orders: Added new Test order of Colon/Endo (Colon/Endo) - Signed    Prescriptions: MOVIPREP 100 GM  SOLR (PEG-KCL-NACL-NASULF-NA ASC-C) As per prep instructions.  #1 x 0   Entered by:   Harlow Mares CMA (AAMA)   Authorized by:   Mardella Layman MD FACG,FAGA   Signed by:   Harlow Mares CMA (AAMA) on 01/10/2009   Method used:   [facility]        [facility]  [address] [phone]* (retail)       [address]       [place], [place]  [address]       Ph: [phone] or [phone]       Fax: [phone]   RxID:   [id number]

## 2010-06-26 NOTE — Procedures (Signed)
Summary: Colon   Colonoscopy  Procedure date:  01/16/2004  Findings:      Location:  [facility].    Procedures Next Due Date:    Colonoscopy: 01/2009  Patient Name: Cheryl Hinton, Cheryl Hinton MRN:  Procedure Procedures: Colonoscopy CPT: [address].  Personnel: Endoscopist: Vania Rea. Jarold Motto, MD.  Exam Location: Exam performed in Outpatient Clinic. Outpatient  Patient Consent: Procedure, Alternatives, Risks and Benefits discussed, consent obtained, from patient. Consent was obtained by the RN.  Indications  Surveillance of: Adenomatous Polyp(s).  Increased Risk Screening: For family history of colorectal neoplasia, in   History  Current Medications: Patient is not currently taking Coumadin.  Pre-Exam Physical: Performed Jan 16, 2004. Cardio-pulmonary exam, Rectal exam, Abdominal exam, Extremity exam, Mental status exam WNL.  Exam Exam: Extent of exam reached: Cecum, extent intended: Cecum.  The cecum was identified by appendiceal orifice and IC valve. Patient position: on left side. Duration of exam: 20 minutes. Colon retroflexion performed. Images taken. ASA Classification: I. Tolerance: excellent.  Monitoring: Pulse and BP monitoring, Oximetry used. Supplemental O2 given. at 2 Liters.  Colon Prep Used Golytely for colon prep. Prep results: fair, adequate exam.  Sedation Meds: Patient assessed and found to be appropriate for moderate (conscious) sedation. Fentanyl 75 mcg. given IV. Versed 7 mg. given IV.  Instrument(s): CF 140L. Serial [id number].  Findings - DIVERTICULOSIS: Transverse Colon to Sigmoid Colon. Not bleeding. ICD9: Diverticulosis, Colon: 562.10.  - NORMAL EXAM: Cecum to Rectum. Not Seen: Polyps. AVM's. Colitis. Tumors. Crohn's. Comments: Very redundant and floppy colon...hard exam.   Assessment Normal examination.  Diagnoses: 562.10: Diverticulosis, Colon.   Events  Unplanned Interventions: No intervention was required.   Plans Medication Plan: Continue current medications.  Patient Education: Patient given standard instructions for: Diverticulosis. Patient instructed to get routine colonoscopy every 5 years.  Disposition: After procedure patient sent to recovery. After recovery patient sent home.  Scheduling/Referral: Follow-Up prn.   This report was created from the original endoscopy report, which was reviewed and signed by the above listed endoscopist.   CC: Al Decant. Janey Greaser, MD

## 2010-06-26 NOTE — Procedures (Signed)
Summary: EGD   EGD  Procedure date:  01/06/2006  Findings:      Location: [facility]    Procedures Next Due Date:    EGD: 12/2007  Patient Name: Cheryl Hinton, Cheryl Hinton MRN:  Procedure Procedures: Panendoscopy (EGD) CPT: 43235.    with biopsy(s)/brushing(s). CPT: D1846139.    with esophageal dilation. CPT: G9296129.  Personnel: Endoscopist: Vania Rea. Jarold Motto, MD.  Exam Location: Exam performed in Outpatient Clinic. Outpatient  Patient Consent: Procedure, Alternatives, Risks and Benefits discussed, consent obtained, from patient. Consent was obtained by the RN.  Indications Symptoms: Dysphagia.  History  Current Medications: Patient is not currently taking Coumadin.  Comments: Hx. of Schatzski's ring dilated 2 years ago... Pre-Exam Physical: Performed Jan 06, 2006  Cardio-pulmonary exam, Abdominal exam, Extremity exam, Mental status exam WNL.  Comments: Pt. history reviewed/updated, physical exam performed prior to initiation of sedation?yes Exam Exam Info: Maximum depth of insertion Duodenum, intended Duodenum. Patient position: on left side. Duration of exam: 10 minutes. Vocal cords visualized. Gastric retroflexion performed. Images taken. ASA Classification: II. Tolerance: excellent.  Sedation Meds: Patient assessed and found to be appropriate for moderate (conscious) sedation. Fentanyl 50 mcg. given IV. Versed 6 mg. given IV. Cetacaine Spray 2 sprays given aerosolized.  Monitoring: BP and pulse monitoring done. Oximetry used. Supplemental O2 given at 2 Liters.  Instrument(s): GIF 160. Serial [id number].   Findings - Normal: Proximal Esophagus to Distal Esophagus. Not Seen: Tumor. Barrett's esophagus. Mucosal abnormality. Foreign body. Varices.  - STRICTURE / STENOSIS: Distal Esophagus.  Lumen diameter is 15 mm. Biopsy of Stricture/Steno  taken. ICD9: Esophageal Stricture: 530.3. Comment: R/O Barrett's mucosa..  - Dilation: Distal Esophagus.  Maloney dilator used, Diameter: 58 F, Minimal Resistance, No Heme present on extraction. 1  total dilators used. Patient tolerance excellent. Outcome: successful.  - MUCOSAL ABNORMALITY: Cardia to Antrum. Nodularity present. Red spots present. Granular mucosa. RUT done, results pending. ICD9: Gastritis, Unspecified: 535.50.  - Normal: Antrum to Duodenal 2nd Portion. Not Seen: Ulcer. Mucosal abnormality. AVM's. Foreign body. Polyp.   Assessment  Diagnoses: 535.50: Gastritis, Unspecified. R/O H.pylori.  530.3: Esophageal Stricture. R/O Barrett's mucosa.   Events  Unplanned Intervention: No unplanned interventions were required.  Plans Medication(s): Await pathology. Continue current medications.  Patient Education: Patient given standard instructions for: Stenosis / Stricture.  Disposition: After procedure patient sent to recovery. After recovery patient sent home.  Scheduling: Await pathology to schedule patient. Follow-up prn.  Comments: F/U per path results.Marland Kitchenand clinical course and response to dilation...  This report was created from the original endoscopy report, which was reviewed and signed by the above listed endoscopist.     CC: Al Decant. Janey Greaser, MD

## 2010-06-28 NOTE — Miscellaneous (Signed)
Summary: Nexium   Clinical Lists Changes  Medications: Changed medication from DEXILANT 60 MG CPDR (DEXLANSOPRAZOLE) take one by mouth once daily...MUST HAVE OFFICE VISIT BEFORE ANY MORE REFILLS to NEXIUM 40 MG CPDR (ESOMEPRAZOLE MAGNESIUM) take one by mouth once daily - Signed Rx of NEXIUM 40 MG CPDR (ESOMEPRAZOLE MAGNESIUM) take one by mouth once daily;  #30 x 12;  Signed;  Entered by: Harlow Mares CMA (AAMA);  Authorized by: Mardella Layman MD [facility];  Method used: [facility]  [address] [phone]*, [address], [place], [place], [place]  [address], Ph: [phone] or [phone], Fax: [phone]    Prescriptions: NEXIUM 40 MG CPDR (ESOMEPRAZOLE MAGNESIUM) take one by mouth once daily  #30 x 12   Entered by:   Harlow Mares CMA (AAMA)   Authorized by:   Mardella Layman MD [facility]   Signed by:   Harlow Mares CMA (AAMA) on 06/13/2010   Method used:   [facility]ronically to        [facility]  [address] [phone]* (retail)       [address]       [place], [place]  [address]       Ph: [phone] or [phone]       Fax: [phone]   RxID:   [id number]

## 2010-06-28 NOTE — Letter (Signed)
Summary: Patient Notice-Endo Biopsy Results  [facility]  [address] [place], [place] [address]   Phone: [phone]  Fax: [phone]        June 19, 2010 MRN: [id number]    Cheryl Hinton [address] [place], [place]  [address]    Dear Cheryl Hinton,  I am pleased to inform you that the biopsies taken during your recent endoscopic examination did not show any evidence of cancer upon pathologic examination.  Additional information/recommendations:  __No further action is needed at this time.  Please follow-up with      your primary care physician for your other healthcare needs.  __ Please call [phone] to schedule a return visit to review      your condition.  x__ Continue with the treatment plan as outlined on the day of your      exam.Biopsies for Helicobacter and celiac disease were negative.  __ You should have a repeat endoscopic examination for this problem              in _ months/years.   Please call us if you are having persistent problems or have questions about your condition that have not been fully answered at this time.  Sincerely,  Mardella Layman MD [facility]  This letter has been electronically signed by your physician.  Appended Document: Patient Notice-Endo Biopsy Results Letter mailed

## 2010-06-28 NOTE — Progress Notes (Signed)
Summary: Triage  Phone Note Call from Patient Call back at Home Phone [phone]   Caller: Patient Call For: Dr. Jarold Motto Reason for Call: Talk to Nurse Summary of Call: Wants to sch'd a EGD. Wants to sch'd directly since she was just in on 05-15-10 Initial call taken by: Karna Christmas,  May 25, 2010 4:49 PM  Follow-up for Phone Call        ..../Spoke with patient who stated she wants to schedule the EGD for nausea. Patient was seen on 05/15/10 and at that time Dr Jarold Motto stated her EGD and Colonoscopy were up to date. When I told patient this she stated now the nausea is back. Patient reported after she eats she feels full and then get nauseated. Informed patient Dr Jarold Motto is off until tomorrow and I will let her know then; patient stated understanding. Graciella Freer RN  May 29, 2010 8:53 AM   Additional Follow-up for Phone Call Additional follow up Details #1::        Schedule ultrasound and endo... Additional Follow-up by: Mardella Layman MD Clementeen Graham,  May 30, 2010 12:55 PM  New Problems: NAUSEA [phone])   Additional Follow-up for Phone Call Additional follow up Details #2::    Dr Jarold Motto is U/S of Abdomen sufficient? Thanks, Graciella Freer, RN 05/31/10, 0815am. Graciella Freer RN  May 31, 2010 8:20 AM    Additional Follow-up for Phone Call Additional follow up Details #3:: Details for Additional Follow-up Action Taken: Tried to call patient to schedule appointments and she is out for a while per her husband.Graciella Freer RN  May 31, 2010 10:58 AM  Additional Follow-up by: Mardella Layman MD Clementeen Graham,  May 31, 2010 12:16 PM  New Problems: NAUSEA [phone])  Appended Document: Triage Spoke with patient and r/s her ABD U/S to 06/04/10 @ 1100am- orders sent. Her Pre Visit is scheduled for 06/04/10 @ 2pm and her EGD is scheduled for 06/13/10 @ 1330pm.

## 2010-06-28 NOTE — Progress Notes (Signed)
Summary: Results  Phone Note Call from Patient Call back at Home Phone [phone]   Caller: Patient Call For: Dr. Jarold Motto Reason for Call: Talk to Nurse Summary of Call: Pt is wanting the results of her ultrasound Initial call taken by: [place] Johnson,  June 05, 2010 1:28 PM  Follow-up for Phone Call        Tried to phone patient but no answer and no answering machine. Will try later. Graciella Freer RN  June 05, 2010 2:23 PM   Spoke with patient to inform her per Dr Jarold Motto, her U/S was normal and he will see her on her f/u on 06/13/10. Patient stated " I must have a stricture then." She is eating light meals and stated she has a hx of strictures. Dr Jarold Motto, [name] and you can sign after reading. Graciella Freer RN  June 06, 2010 8:50 AM   Additional Follow-up for Phone Call Additional follow up Details #1::        OK Additional Follow-up by: Mardella Layman MD Clementeen Graham,  June 06, 2010 10:46 AM

## 2010-06-28 NOTE — Letter (Signed)
Summary: EGD Instructions  [facility]  [address] [place], [place] [address]   Phone: [phone]  Fax: [phone]       Cheryl Hinton    [birth year]    MRN: [id number]       Procedure Day Dorna Bloom: Wednesday ,06-13-10      Arrival Time:  12:30 pm     Procedure Time: 1:30 pm     Location of Procedure:                    _x  _  Endoscopy Center (4th Floor)    PREPARATION FOR ENDOSCOPY   On Wednesday 06-13-10, THE DAY OF THE PROCEDURE:  1.   No solid foods, milk or milk products are allowed after midnight the night before your procedure.  2.   Do not drink anything colored red or purple.  Avoid juices with pulp.  No orange juice.  3.  You may drink clear liquids until 11:30 a.m. , which is 2 hours before your procedure.                                                                                                CLEAR LIQUIDS INCLUDE: Water Jello Ice Popsicles Tea (sugar ok, no milk/cream) Powdered fruit flavored drinks Coffee (sugar ok, no milk/cream) Gatorade Juice: apple, white grape, white cranberry  Lemonade Clear bullion, consomm, broth Carbonated beverages (any kind) Strained chicken noodle soup Hard Candy   MEDICATION INSTRUCTIONS  Unless otherwise instructed, you should take regular prescription medications with a small sip of water as early as possible the morning of your procedure             OTHER INSTRUCTIONS  You will need a responsible adult at least 73 years of age to accompany you and drive you home.   This person must remain in the waiting room during your procedure.  Wear loose fitting clothing that is easily removed.  Leave jewelry and other valuables at home.  However, you may wish to bring a book to read or an iPod/MP3 player to listen to music as you wait for your procedure to start.  Remove all body piercing jewelry and leave at home.  Total time from sign-in until discharge is approximately 2-3  hours.  You should go home directly after your procedure and rest.  You can resume normal activities the day after your procedure.  The day of your procedure you should not:   Drive   Make legal decisions   Operate machinery   Drink alcohol   Return to work  You will receive specific instructions about eating, activities and medications before you leave.    The above instructions have been reviewed and explained to me by   Karl Bales RN  June 04, 2010 2:27 PM    I fully understand and can verbalize these instructions _____________________________ Date _________

## 2010-06-28 NOTE — Procedures (Addendum)
Summary: Upper Endoscopy  Patient: Cheryl Hinton Note: All result statuses are Final unless otherwise noted.  Tests: (1) Upper Endoscopy (EGD)   EGD Upper Endoscopy       DONE     [facility]     [address]     [place], [place]  [address]           ENDOSCOPY PROCEDURE REPORT           PATIENT:  Cheryl Hinton, Cheryl Hinton  MR#:  [id number]     BIRTHDATE:  August 28, 1935, 75 yrs. old  GENDER:  female           ENDOSCOPIST:  Vania Rea. Jarold Motto, MD, [facility]     Referred by:  Catha Gosselin, M.D.           PROCEDURE DATE:  06/13/2010     PROCEDURE:  EGD with biopsy, [id number]     ASA CLASS:  Class II     INDICATIONS:  GERD, dysphagia           MEDICATIONS:   Fentanyl 50 mcg IV, Versed 5 mg IV     TOPICAL ANESTHETIC:  Exactacain Spray           DESCRIPTION OF PROCEDURE:   After the risks benefits and     alternatives of the procedure were thoroughly explained, informed     consent was obtained.  The LB GIF-H180 [id number] endoscope was     introduced through the mouth and advanced to the second portion of     the duodenum, without limitations.  The instrument was slowly     withdrawn as the mucosa was fully examined.     <<PROCEDUREIMAGES>>           A stricture was found at the gastroesophageal junction. dilated     #54f maloney dilator.  The upper, middle, and distal third of the     esophagus were carefully inspected and no abnormalities were     noted. The z-line was well seen at the GEJ. The endoscope was     pushed into the fundus which was normal including a retroflexed     view. The antrum,gastric body, first and second part of the     duodenum were unremarkable. BIOPSIES FOR H.PYLORI DONE.  Normal     duodenal folds were noted. SI BX. DONE.    Retroflexed views     revealed no abnormalities.    The scope was then withdrawn from     the patient and the procedure completed.           COMPLICATIONS:  None           ENDOSCOPIC IMPRESSION:     1) Stricture at the gastroesophageal  junction     2) Normal EGD     3) Normal duodenal folds     GERD,R/O H.PYLORI.     RECOMMENDATIONS:     1) Await biopsy results     NEXIUM DAILY.           REPEAT EXAM:  No           ______________________________     Vania Rea. Jarold Motto, MD, [facility]           CC:  Catha Gosselin, M.D.           n.     eSIGNED:   Vania Rea. Patterson at 06/13/2010 02:25 PM           Bennetta Laos, [id number]  Note: An exclamation mark (!) indicates a result that was not dispersed into the flowsheet. Document Creation Date: 06/13/2010 2:26 PM _______________________________________________________________________  (1) Order result status: Final Collection or observation date-time: 06/13/2010 14:17 Requested date-time:  Receipt date-time:  Reported date-time:  Referring Physician:   Ordering Physician: Sheryn Bison [phone]) Specimen Source:  Source: Launa Grill Order Number: [id number] Lab site:

## 2010-06-28 NOTE — Miscellaneous (Signed)
Summary: [facility] previsit  Clinical Lists Changes  Observations: Added new observation of ALLERGY REV: Done (06/04/2010 13:59)

## 2010-06-28 NOTE — Assessment & Plan Note (Signed)
Summary: ER FOLLOW UP FROM 8M AGO/NAUSEA & DIARRHEA/YF   History of Present Illness Visit Type: Follow-up Visit Primary GI MD: Sheryn Bison MD Faith Rogue Primary Provider: Demaris Callander Requesting Provider: n/a Chief Complaint: [facility], occassional nausea, diarrhea has subsided History of Present Illness:   75 year old Caucasian female who has been a chronic patient of mine for many years but diverticulosis coli, chronic constipation, chronic acid reflux. She recently was in the emergency room on November 21 with acute abdominal pain and nausea and vomiting. Multiple labs and x-rays have been reviewed from that emergency room visit including CT scan of the abdomen and pelvis that was unremarkable. Apparently, she had a reaction to tramadol similar to previous reactions.  She currently is asymptomatic taking Dexilant 60 mg a day, MiraLax daily, and p.r.n. hydrocodone for back pain. Her hospitalization she was hypokalemic, and I cannot find any followup serum potassium level. She does take regular diuretics for her cardiovascular issues.   GI Review of Systems    Reports nausea.      Denies abdominal pain, acid reflux, belching, bloating, chest pain, dysphagia with liquids, dysphagia with solids, heartburn, loss of appetite, vomiting, vomiting blood, weight loss, and  weight gain.        Denies anal fissure, black tarry stools, change in bowel habit, constipation, diarrhea, diverticulosis, fecal incontinence, heme positive stool, hemorrhoids, irritable bowel syndrome, jaundice, light color stool, liver problems, rectal bleeding, and  rectal pain.    Current Medications (verified): 1)  Lisinopril-Hydrochlorothiazide 20-25 Mg  Tabs (Lisinopril-Hydrochlorothiazide) .... One Tablet Once A Day 2)  Atenolol 25 Mg  Tabs (Atenolol) .... 1/2 Tablet By Mouth Once Daily 3)  Premarin 0.3 Mg  Tabs (Estrogens Conjugated) .... One By Mouth Once Daily 4)  Red Yeast Rice 600 Mg  Caps (Red Yeast Rice  Extract) .... One By Mouth Once Daily 5)  Glucosamine 1500 Complex   Caps (Glucosamine-Chondroit-Vit C-Mn) .... One By Mouth Once Daily 6)  Doxepin Hcl 50 Mg Caps (Doxepin Hcl) .... Take 1 Tablet At Bedtime 7)  Dexilant 60 Mg Cpdr (Dexlansoprazole) .... Take One By Mouth Once Daily...must Have Office Visit Before Any More Refills 8)  K-Tabs 10 Meq  Tbcr (Potassium Chloride) .... One Tablet Once A Day 9)  Hydrocodone-Acetaminophen 10-325 Mg Tabs (Hydrocodone-Acetaminophen) .... Take 1 Tablet Two Times A Day For Pain 10)  Miralax   Powd (Polyethylene Glycol 3350) .... Mix One Scoop in Water and Drink Two Times A Day Decrease If Needed 11)  Fish Oil 1000 Mg Caps (Omega-3 Fatty Acids) .... Three Times A Day 12)  Lyrica 50 Mg Caps (Pregabalin) .... Once Daily  Allergies (verified): 1)  ! Nsaids  Past History:  Past medical, surgical, family and social histories (including risk factors) reviewed for relevance to current acute and chronic problems.  Past Medical History: Reviewed history from 11/20/2007 and no changes required. Current Problems:  HYPERLIPIDEMIA (ICD-272.4) DEGENERATIVE JOINT DISEASE (ICD-715.90) HYPERTENSION (ICD-401.9) DIVERTICULOSIS, COLON (ICD-562.10) ESOPHAGEAL STRICTURE (ICD-530.3) GASTRITIS (ICD-535.50)  Past Surgical History: Reviewed history from 11/20/2007 and no changes required. Appendectomy Hysterectomy laparotomy hemorrhoidectomy removal of bladder polyps 3 knee surgerys 2 foot surgerys  Family History: Reviewed history from 11/20/2007 and no changes required. Family History of Colon Cancer: mother age 65 Family History of Diabetes: mother Family History of Heart Disease: father  Social History: Reviewed history from 11/20/2007 and no changes required. Patient has never smoked.  Alcohol Use - no Illicit Drug Use - no Patient gets regular exercise. Daily  Caffeine Use  Review of Systems       The patient complains of arthritis/joint pain and  back pain.  The patient denies allergy/sinus, anemia, anxiety-new, blood in urine, breast changes/lumps, change in vision, confusion, cough, coughing up blood, depression-new, fainting, fatigue, fever, headaches-new, hearing problems, heart murmur, heart rhythm changes, itching, menstrual pain, muscle pains/cramps, night sweats, nosebleeds, pregnancy symptoms, shortness of breath, skin rash, sleeping problems, sore throat, swelling of feet/legs, swollen lymph glands, thirst - excessive, urination - excessive, urination changes/pain, urine leakage, vision changes, and voice change.    Vital Signs:  Patient profile:   75 year old female Height:      63.5 inches Weight:      169 pounds BMI:     29.57 Pulse rate:   60 / minute Pulse rhythm:   regular BP sitting:   134 / 70  (right arm) Cuff size:   regular  Vitals Entered By: June McMurray CMA Duncan Dull) (May 15, 2010 2:17 PM)  Physical Exam  General:  Well developed, well nourished, no acute distress.healthy appearing.   Head:  Normocephalic and atraumatic. Eyes:  exam deferred to patient's ophthalmologist.   Lungs:  Clear throughout to auscultation. Heart:  Regular rate and rhythm; no murmurs, rubs,  or bruits. Abdomen:  Soft, nontender and nondistended. No masses, hepatosplenomegaly or hernias noted. Normal bowel sounds. Extremities:  No clubbing, cyanosis, edema or deformities noted. Neurologic:  Alert and  oriented x4;  grossly normal neurologically. Psych:  Alert and cooperative. Normal mood and affect.   Impression & Recommendations:  Problem # 1:  CONSTIPATION (ICD-564.00) Assessment Improved Continue MiraLax daily as needed. She is up-to-date on her colonoscopy exams. Repeat serum potassium level ordered to exclude hypokalemia. Obviously, a lot of her constipation is related to regular hydrocodone use.  Problem # 2:  BARRETTS ESOPHAGUS (ICD-530.85) Assessment: Unchanged Continue antireflux regime with daily Dexilant 60 mg  before breakfast. She is up-to-date on her endoscopic exams. She also has had previous negative ultrasound of her gallbladder, and denies the current hepatobiliary complaints. Liver function tests also have been normal.  Problem # 3:  DYSPHAGIA (ICD-787.29) Assessment: Improved  Problem # 4:  FAMILY HX COLON CANCER (ICD-V16.0) Assessment: Unchanged Followup colonoscopy as per clinical protocol every 5 years.  Patient Instructions: 1)  Please schedule a follow-up appointment as needed.  2)  We have requested your latest labwork from Dr Catha Gosselin. If we get those back and we think you need more potassium, we will give you a call. 3)  Copy sent to : Duane Lope, MD and Catha Gosselin, MD 4)  The medication list was reviewed and reconciled.  All changed / newly prescribed medications were explained.  A complete medication list was provided to the patient / caregiver. 5)  electrolyte panel suggested 6)  Avoid foods high in acid content ( tomatoes, citrus juices, spicy foods) . Avoid eating within 3 to 4 hours of lying down or before exercising. Do not over eat; try smaller more frequent meals. Elevate head of bed four inches when sleeping.  7)  Diet should be high in fiber ( fruits, vegetables, whole grains) but low in residue. Drink at least eight (8) glasses of water a day.

## 2010-06-28 NOTE — Progress Notes (Signed)
Summary: Biopsy results  Phone Note Call from Patient Call back at Home Phone [phone]   Caller: Patient Call For: Dr. Jarold Motto Reason for Call: Lab or Test Results Summary of Call: Calling about her biopsy results and the Nexium is too expensive wants to continue the Dexilant Initial call taken by: Karna Christmas,  June 15, 2010 12:20 PM  Follow-up for Phone Call        bx results are not back yet. changed ppi to Dexilant. Follow-up by: Harlow Mares CMA (AAMA),  June 15, 2010 1:18 PM    New/Updated Medications: DEXILANT 60 MG CPDR (DEXLANSOPRAZOLE) take one by mouth once daily Prescriptions: DEXILANT 60 MG CPDR (DEXLANSOPRAZOLE) take one by mouth once daily  #30 x 6   Entered by:   Harlow Mares CMA (AAMA)   Authorized by:   Mardella Layman MD [facility]   Signed by:   Harlow Mares CMA (AAMA) on 06/15/2010   Method used:   Electronically to        [facility]  [address] [phone]* (retail)       [address]       [place], [place]  [id number]       Ph: [phone] or [phone]       Fax: [phone]   RxID:   [id number]

## 2010-06-29 NOTE — Medication Information (Signed)
Summary: Approved for Dexilant/[facility]  Approved for Dexilant/[facility]   Imported By: Sherian Rein 03/08/2009 14:39:31  _____________________________________________________________________  External Attachment:    Type:   Image     Comment:   External Document

## 2010-08-07 LAB — COMPREHENSIVE METABOLIC PANEL
ALT: 20 U/L (ref 0–35)
AST: 20 U/L (ref 0–37)
Albumin: 3.6 g/dL (ref 3.5–5.2)
Alkaline Phosphatase: 54 U/L (ref 39–117)
BUN: 6 mg/dL (ref 6–23)
CO2: 26 mEq/L (ref 19–32)
Calcium: 8.6 mg/dL (ref 8.4–10.5)
Chloride: 100 mEq/L (ref 96–112)
Creatinine, Ser: 0.61 mg/dL (ref 0.4–1.2)
GFR calc Af Amer: >60 mL/min (ref >60)
GFR calc non Af Amer: >60 mL/min (ref >60)
Glucose, Bld: 75 mg/dL (ref 70–99)
Potassium: 3 mEq/L — ABNORMAL LOW (ref 3.5–5.1)
Sodium: 135 mEq/L (ref 135–145)
Total Bilirubin: 0.8 mg/dL (ref 0.3–1.2)
Total Protein: 6.7 g/dL (ref 6.0–8.3)

## 2010-08-07 LAB — CBC
HCT: 37.2 % (ref 36.0–46.0)
Hemoglobin: 12.7 g/dL (ref 12.0–15.0)
MCH: 30.8 pg (ref 26.0–34.0)
MCHC: 34.1 g/dL (ref 30.0–36.0)
MCV: 90.3 fL (ref 78.0–100.0)
Platelets: 226 10*3/uL (ref 150–400)
RBC: 4.12 MIL/uL (ref 3.87–5.11)
RDW: 12.6 % (ref 11.5–15.5)
WBC: 6 10*3/uL (ref 4.0–10.5)

## 2010-08-07 LAB — URINALYSIS, ROUTINE W REFLEX MICROSCOPIC
Bilirubin Urine: NEGATIVE
Glucose, UA: NEGATIVE mg/dL
Hgb urine dipstick: NEGATIVE
Ketones, ur: >80 mg/dL — AB
Nitrite: NEGATIVE
Protein, ur: NEGATIVE mg/dL
Specific Gravity, Urine: 1.015 (ref 1.005–1.030)
Urobilinogen, UA: 0.2 mg/dL (ref 0.0–1.0)
pH: 6 (ref 5.0–8.0)

## 2010-08-07 LAB — DIFFERENTIAL
Basophils Absolute: 0.1 10*3/uL (ref 0.0–0.1)
Basophils Relative: 1 % (ref 0–1)
Eosinophils Absolute: 0.1 10*3/uL (ref 0.0–0.7)
Eosinophils Relative: 1 % (ref 0–5)
Lymphocytes Relative: 29 % (ref 12–46)
Lymphs Abs: 1.7 10*3/uL (ref 0.7–4.0)
Monocytes Absolute: 0.7 10*3/uL (ref 0.1–1.0)
Monocytes Relative: 11 % (ref 3–12)
Neutro Abs: 3.5 10*3/uL (ref 1.7–7.7)
Neutrophils Relative %: 58 % (ref 43–77)

## 2010-08-07 LAB — LIPASE, BLOOD: Lipase: 25 U/L (ref 11–59)

## 2010-10-12 NOTE — Assessment & Plan Note (Signed)
[facility]                           GASTROENTEROLOGY OFFICE NOTE   JERUSHA, REISING                    MRN:          [id number]  DATE:12/24/2005                            DOB:          06-Jul-1935    HISTORY OF PRESENT ILLNESS:  Ms. Mckiver is a 75 year old white female who  has recurrent Schatzki's' ring in her distal esophagus, last dilated 5 years  ago.  She now presents with intermittent solitude and progressive dysphagia.  She has no acid reflux symptoms, whatsoever.  She was in [facility]  Emergency Room on Oct 14, 2005, with acute diffuse abdominal pain and had  leukocytosis with a rather marked shift.  She had abdominal and pelvic CT  scan, of course, performed in the emergency room which was unremarkable as  was nuclear medicine whole body scan which was performed for reasons which  are unclear.  She was told that she had two viruses and was discharged  home after intravenous fluids.  She is now asymptomatic except for chronic  abdominal gas, bloating and constipation requiring twice a day fiber  supplementation.  Her last colonoscopy was in August 2005 and showed  diverticulosis but otherwise was unremarkable.  She does have a family  history of colon carcinoma in her mother.   CURRENT MEDICATIONS:  1.  Lispro 25/20 a day.  2.  Potassium replacement.  3.  Atenolol 12.5 mg daily.  4.  Premarin 0.3 mg daily.  5.  A variety of multivitamins and herbal substances and hydroxyzine 10 mg a      day.  6.  She uses p.r.n. Donnatal.   ALLERGIES:  She denies drug allergies.   PHYSICAL EXAMINATION:  GENERAL:  She is a healthy-appearing white female who  appeared her stated age.  I could not appreciate stigmata of chronic liver  disease.  VITAL SIGNS:  She is 5 feet 4 inches and weighs 170 pounds.  Blood pressure  is 118/76.  Pulse was 64 and regular.  CHEST:  Her chest was clear.  HEART:  There were no murmurs, gallops or rubs noted.  She appeared to be in  a regular rhythm.  ABDOMEN:  I did not appreciate hepatosplenomegaly, abdominal masses or  tenderness.  Bowel sounds are normal.  MENTAL STATUS: Clear.  RECTAL:  Exam was deferred.   ASSESSMENT:  1.  Recurrent Schatzki's ring accountable for dysphagia.  2.  Rule out cholelithiasis per recent emergency room visit.  3.  Chronic diverticulosis coli with chronic abdominal gas and bloating and      constipation.  4.  Well controlled essential hypertension.   RECOMMENDATIONS:  1.  Outpatient endoscopy and dilation.  2.  Check up of abdominal ultrasound exam.  3.  Continue other medications as per Dr. Janey Greaser.                                   Vania Rea. Jarold Motto, MD, [facility], [place]   DRP/MedQ  DD:  12/24/2005  DT:  12/24/2005  Job #:  [id number]  cc:   Al Decant. Janey Greaser, MD

## 2010-10-12 NOTE — Procedures (Signed)
[facility]                            ABDOMINAL ULTRASOUND REPORT   ROSS, HEFFERAN                    MRN:          [id number]  DATE:12/30/2005                            DOB:          Apr 02, 1936    SESSION NUMBER:  [id number].   <READING PHYSICIAN/>  Vania Rea. Jarold Motto, MD, [facility], FACP.   PROCEDURE:  Multiplanar abdominal ultrasound imaging was performed in the  upright, supine, right and left lateral decubitus positions.   RESULTS:  Abdominal aorta 2 cm.  The IVC is patent.   The pancreas appears normal throughout the head, body and tail without  evidence of ductal dilatation, pancreatic masses, or peripancreatic  inflammation.   Gallbladder is well distended, wall thickness 2.9 mm, with no  pericholecystic fluid or intraluminal echogenic foci to suggest gallstone  disease.   The common bile duct measures 3.9 mm in maximal diameter without evidence of  intraluminal foci.   The liver appears normal without evidence of parenchymal lesion, ductal  dilatation or vascular abnormality.   Kidneys are normal in appearance.  Right 9.2 cm, left 10.5 cm.   Spleen is normal in size, measuring 9.5 cm without parenchymal lesion.   ASSESSMENT:  This is a normal upper abdominal ultrasound exam without  evidence of cholelithiasis or biliary ductal dilatation.  The pancreas and  liver are well visualized and appear normal.                                   Vania Rea. Jarold Motto, MD, [facility], [place]   DRP/MedQ  DD:  12/30/2005  DT:  12/30/2005  Job #:  [id number]

## 2010-10-24 ENCOUNTER — Telehealth: Payer: Self-pay | Admitting: Gastroenterology

## 2010-10-24 MED ORDER — PANTOPRAZOLE SODIUM 40 MG PO TBEC: 40.0000 mg | DELAYED_RELEASE_TABLET | Freq: Every day | ORAL | Status: DC

## 2010-10-24 NOTE — Telephone Encounter (Signed)
rx sent

## 2011-01-25 ENCOUNTER — Other Ambulatory Visit: Payer: Self-pay | Admitting: Gastroenterology

## 2011-02-25 LAB — CREATININE, SERUM
Creatinine, Ser: 0.62
GFR calc Af Amer: >60
GFR calc non Af Amer: >60

## 2011-04-30 ENCOUNTER — Other Ambulatory Visit: Payer: Self-pay | Admitting: Gastroenterology

## 2011-04-30 NOTE — Telephone Encounter (Signed)
[facility] system down I will call back tomorrow.

## 2011-05-01 MED ORDER — PANTOPRAZOLE SODIUM 40 MG PO TBEC: 40.0000 mg | DELAYED_RELEASE_TABLET | Freq: Every day | ORAL | Status: DC

## 2011-05-01 NOTE — Telephone Encounter (Signed)
Per [facility] protonix is approved, husband aware

## 2012-01-13 ENCOUNTER — Ambulatory Visit (INDEPENDENT_AMBULATORY_CARE_PROVIDER_SITE_OTHER): Payer: Medicare Other | Admitting: Cardiovascular Disease

## 2012-01-13 ENCOUNTER — Encounter: Payer: Self-pay | Admitting: Cardiovascular Disease

## 2012-01-13 VITALS — BP 132/80 | HR 64 | Ht 63.5 in | Wt 176.4 lb

## 2012-01-13 DIAGNOSIS — R0600 Dyspnea, unspecified: Secondary | ICD-10-CM

## 2012-01-13 DIAGNOSIS — R0602 Shortness of breath: Secondary | ICD-10-CM | POA: Insufficient documentation

## 2012-01-13 DIAGNOSIS — R0989 Other specified symptoms and signs involving the circulatory and respiratory systems: Secondary | ICD-10-CM

## 2012-01-13 DIAGNOSIS — R0609 Other forms of dyspnea: Secondary | ICD-10-CM

## 2012-01-13 NOTE — Assessment & Plan Note (Signed)
Cheryl Hinton presents today for further evaluation of her shortness breath. She's been short of breath for the past several months. She denies any sputum production.  She was she sleeps on 2 pillows but it sounds like this is more for her orthopedic upper and not PND. She denies any leg swelling. She denies any cough.  We'll get an echocardiogram for further assessment of her left ventricular function and valve function. We'll also get a Lexiscan myoivew  to rule out ischemia. I will see  her again in 3 months for followup office visit.

## 2012-01-13 NOTE — Patient Instructions (Addendum)
Your physician has requested that you have a lexiscan myoview.  Please follow instruction sheet, as given.  Your physician has requested that you have an echocardiogram. Echocardiography is a painless test that uses sound waves to create images of your heart. It provides your doctor with information about the size and shape of your heart and how well your heart's chambers and valves are working. This procedure takes approximately one hour. There are no restrictions for this procedure.  Your physician recommends that you schedule a follow-up appointment in: 3 months

## 2012-01-13 NOTE — Progress Notes (Signed)
Cheryl Hinton Date of Birth  1935/07/03       [facility]    [place] [address]  [address] [place], [place]  [address]   [place], [place]  [address] [phone]     [phone]   Fax  [phone]    Fax [phone]  Problem List: 1. History of chest pain 2. Hyperlipidemia 3. Hypertension 4. History of vertigo 5. Gastroesophageal reflux disease  History of Present Illness:  Cheryl Hinton is a 76 yo who presents for further evaluation of her dyspnea.  The episodes are not necessarily associated with exertion. Several times throughout the day she does need to take a deep breath.  She also has significant shortness breath with any sort of exertion such as doing her normal chores.  She swims 1 3/4 hours a day 2 days a week.  She sleeps on 2 pillows at night but this is hard for her to lay flat.  She denies any significant leg swelling.     She denies any fevers or chills. She denies any cough or sputum production.  Current Outpatient Prescriptions on File Prior to Visit  Medication Sig Dispense Refill  . atenolol (TENORMIN) 50 MG tablet Take 25 mg by mouth daily.       . Calcium Carbonate-Vit D-Min (CALCIUM 1200 PO) Take by mouth daily.      Marland Kitchen doxepin (SINEQUAN) 50 MG capsule Take 50 mg by mouth at bedtime.       Marland Kitchen estradiol (ESTRACE) 0.5 MG tablet Take 0.5 mg by mouth daily.       Marland Kitchen lisinopril-hydrochlorothiazide (PRINZIDE,ZESTORETIC) 20-25 MG per tablet Take 1 tablet by mouth daily.       . pantoprazole (PROTONIX) 40 MG tablet Take 1 tablet (40 mg total) by mouth daily.  30 tablet  6  . potassium chloride (K-DUR,KLOR-CON) 10 MEQ tablet Take 10 mEq by mouth daily.       . pregabalin (LYRICA) 50 MG capsule Take 50 mg by mouth daily.      . sertraline (ZOLOFT) 100 MG tablet Take 100 mg by mouth daily.         Allergies  Allergen Reactions  . Nsaids     Past Medical History  Diagnosis Date  . Hypertension   .  Hyperlipidemia     Past Surgical History  Procedure Date  . Tonsillectomy   . Appendectomy   . Knee surgery   . Foot surgery   . Breast biopsy   . Hemorroidectomy   . Bladder removal     History  Smoking status  . Former Smoker  Smokeless tobacco  . Not on file    History  Alcohol Use: Not on file    History reviewed. No pertinent family history.  Reviw of Systems:  Reviewed in the HPI.  All other systems are negative.  Physical Exam: Blood pressure 132/80, pulse 64, height 5' 3.5" (1.613 m), weight 176 lb 6.4 oz (80.015 kg), SpO2 96.00%. General: Well developed, well nourished, in no acute distress.  Head: Normocephalic, atraumatic, sclera non-icteric, mucus membranes are moist,   Neck: Supple. Carotids are 2 + without bruits. No JVD  Lungs: Clear bilaterally to auscultation.  Heart: regular rate.  normal  S1 S2. No murmurs, gallops or rubs.  Abdomen: Soft, non-tender, non-distended with normal bowel sounds. No hepatomegaly. No rebound/guarding. No masses.  Msk:  Strength and tone are normal  Extremities: No clubbing or cyanosis. No  edema.  Distal pedal pulses are 2+ and equal bilaterally.  Neuro: Alert and oriented X 3. Moves all extremities spontaneously.  Psych:  Responds to questions appropriately with a normal affect.  ECG: 01/13/2012-normal sinus rhythm at 64 beats a minute. There are no  ST or T wave changes.  Assessment / Plan:

## 2012-01-15 ENCOUNTER — Ambulatory Visit (HOSPITAL_COMMUNITY): Payer: Medicare Other | Attending: Cardiology

## 2012-01-15 DIAGNOSIS — Z87891 Personal history of nicotine dependence: Secondary | ICD-10-CM | POA: Insufficient documentation

## 2012-01-15 DIAGNOSIS — I10 Essential (primary) hypertension: Secondary | ICD-10-CM | POA: Insufficient documentation

## 2012-01-15 DIAGNOSIS — E785 Hyperlipidemia, unspecified: Secondary | ICD-10-CM | POA: Insufficient documentation

## 2012-01-15 DIAGNOSIS — I059 Rheumatic mitral valve disease, unspecified: Secondary | ICD-10-CM | POA: Insufficient documentation

## 2012-01-15 DIAGNOSIS — R0989 Other specified symptoms and signs involving the circulatory and respiratory systems: Secondary | ICD-10-CM | POA: Insufficient documentation

## 2012-01-15 DIAGNOSIS — R0600 Dyspnea, unspecified: Secondary | ICD-10-CM

## 2012-01-15 DIAGNOSIS — R0609 Other forms of dyspnea: Secondary | ICD-10-CM | POA: Insufficient documentation

## 2012-01-15 NOTE — Progress Notes (Signed)
Echocardiogram performed.  

## 2012-01-22 ENCOUNTER — Ambulatory Visit (HOSPITAL_COMMUNITY): Payer: Medicare Other | Attending: Cardiology | Admitting: Radiology

## 2012-01-22 VITALS — BP 136/69 | HR 63 | Ht 63.0 in | Wt 175.0 lb

## 2012-01-22 DIAGNOSIS — R0609 Other forms of dyspnea: Secondary | ICD-10-CM | POA: Insufficient documentation

## 2012-01-22 DIAGNOSIS — R0789 Other chest pain: Secondary | ICD-10-CM | POA: Insufficient documentation

## 2012-01-22 DIAGNOSIS — R55 Syncope and collapse: Secondary | ICD-10-CM | POA: Insufficient documentation

## 2012-01-22 DIAGNOSIS — R079 Chest pain, unspecified: Secondary | ICD-10-CM

## 2012-01-22 DIAGNOSIS — R0602 Shortness of breath: Secondary | ICD-10-CM

## 2012-01-22 DIAGNOSIS — R002 Palpitations: Secondary | ICD-10-CM | POA: Insufficient documentation

## 2012-01-22 DIAGNOSIS — R0600 Dyspnea, unspecified: Secondary | ICD-10-CM

## 2012-01-22 DIAGNOSIS — E785 Hyperlipidemia, unspecified: Secondary | ICD-10-CM

## 2012-01-22 DIAGNOSIS — Z87891 Personal history of nicotine dependence: Secondary | ICD-10-CM | POA: Insufficient documentation

## 2012-01-22 DIAGNOSIS — R0989 Other specified symptoms and signs involving the circulatory and respiratory systems: Secondary | ICD-10-CM | POA: Insufficient documentation

## 2012-01-22 DIAGNOSIS — I10 Essential (primary) hypertension: Secondary | ICD-10-CM | POA: Insufficient documentation

## 2012-01-22 MED ORDER — REGADENOSON 0.4 MG/5ML IV SOLN
0.4000 mg | Freq: Once | INTRAVENOUS | Status: AC
  Administered 2012-01-22: 0.4 mg via INTRAVENOUS

## 2012-01-22 MED ORDER — TECHNETIUM TC 99M TETROFOSMIN IV KIT
33.0000 | PACK | Freq: Once | INTRAVENOUS | Status: AC | PRN
  Administered 2012-01-22: 33 via INTRAVENOUS

## 2012-01-22 MED ORDER — TECHNETIUM TC 99M TETROFOSMIN IV KIT
11.0000 | PACK | Freq: Once | INTRAVENOUS | Status: AC | PRN
  Administered 2012-01-22: 11 via INTRAVENOUS

## 2012-01-22 NOTE — Progress Notes (Signed)
[facility] SITE 3 NUCLEAR MED [address] [place] [address] [phone]  Cardiology Nuclear Med Study  Cheryl Hinton is a 76 y.o. female     MRN : [id number]     DOB: 1935/10/18  Procedure Date: 01/22/2012  Nuclear Med Background Indication for Stress Test:  Evaluation for Ischemia History:  01/15/12 Echo:EF=60% Cardiac Risk Factors: Family History - CAD, History of Smoking, Hypertension and Lipids  Symptoms:  Chest Tightness with Exertion (last date of chest discomfort was yesterday), Diaphoresis, Dizziness, DOE, Fatigue, Nausea, Near Syncope, Palpitations, Rapid HR and SOB   Nuclear Pre-Procedure Caffeine/Decaff Intake:  None NPO After: 12:00am   Lungs:  Clear. O2 Sat: 98% on room air. IV 0.9% NS with Angio Cath:  20g  IV Site: R Antecubital  IV Started by:  Stanton Kidney, EMT-P  Chest Size (in):  40 Cup Size: B  Height: 5\' 3"  (1.6 m)  Weight:  175 lb (79.379 kg)  BMI:  Body mass index is 31.00 kg/(m^2). Tech Comments:  Atenolol held >36 hours    Nuclear Med Study 1 or 2 day study: 1 day  Stress Test Type:  Eugenie Birks  Reading MD: Olga Millers, MD  Order Authorizing Provider:  Kristeen Miss, MD  Resting Radionuclide: Technetium 77m Tetrofosmin  Resting Radionuclide Dose: 11.0 mCi   Stress Radionuclide:  Technetium 49m Tetrofosmin  Stress Radionuclide Dose: 33.0 mCi           Stress Protocol Rest HR: 63 Stress HR: 105  Rest BP: 136/69 Stress BP: 138/75  Exercise Time (min): n/a METS: n/a   Predicted Max HR: 146 bpm % Max HR: 71.92 bpm Rate Pressure Product: 46962   Dose of Adenosine (mg):  n/a Dose of Lexiscan: 0.4 mg  Dose of Atropine (mg): n/a Dose of Dobutamine: n/a mcg/kg/min (at max HR)  Stress Test Technologist: Smiley Houseman, CMA-N  Nuclear Technologist:  Domenic Polite, CNMT     Rest Procedure:  Myocardial perfusion imaging was performed at rest 45 minutes following the intravenous administration of Technetium 59m  Tetrofosmin.  Rest ECG: Nonspecific ST-T wave changes.  Stress Procedure:  The patient received IV Lexiscan 0.4 mg over 15-seconds.  Technetium 69m Tetrofosmin injected at 30-seconds.  There were no significant changes with Lexiscan.  She c/o nausea and back pain and was given IV aminophylline 75 mg with relief.  Quantitative spect images were obtained after a 45 minute delay.  Stress ECG: No significant ST segment change suggestive of ischemia.  QPS Raw Data Images:  Acquisition technically good; normal left ventricular size. Stress Images:  There is decreased uptake in the anterior wall. Rest Images:  There is decreased uptake in the anterior wall. Subtraction (SDS):  No evidence of ischemia. Transient Ischemic Dilatation (Normal <1.22):  1.01 Lung/Heart Ratio (Normal <0.45):  0.37  Quantitative Gated Spect Images QGS EDV:  69 ml QGS ESV:  19 ml  Impression Exercise Capacity:  Lexiscan with no exercise. BP Response:  Normal blood pressure response. Clinical Symptoms:  No chest pain or dyspnea. ECG Impression:  No significant ST segment change suggestive of ischemia. Comparison with Prior Nuclear Study: No previous nuclear study performed  Overall Impression:  Normal stress nuclear study with a small, mild, fixed anterior defect consistent with soft tissue attenuation; no ischemia.  LV Ejection Fraction: 73%.  LV Wall Motion:  NL LV Function; NL Wall Motion   Olga Millers

## 2012-03-02 ENCOUNTER — Other Ambulatory Visit: Payer: Self-pay | Admitting: Gastroenterology

## 2012-03-30 ENCOUNTER — Telehealth: Payer: Self-pay | Admitting: Cardiovascular Disease

## 2012-03-30 NOTE — Telephone Encounter (Signed)
Pt told to wait till app in two weeks, pt does not have dx of bruit, pt agreed to wait till seen.

## 2012-03-30 NOTE — Telephone Encounter (Signed)
Pt would like to have a carotid prior to appt

## 2012-04-14 ENCOUNTER — Ambulatory Visit (INDEPENDENT_AMBULATORY_CARE_PROVIDER_SITE_OTHER): Payer: Medicare Other | Admitting: Cardiovascular Disease

## 2012-04-14 ENCOUNTER — Encounter: Payer: Self-pay | Admitting: Cardiovascular Disease

## 2012-04-14 VITALS — BP 118/76 | HR 74 | Ht 63.0 in | Wt 177.0 lb

## 2012-04-14 DIAGNOSIS — R0602 Shortness of breath: Secondary | ICD-10-CM

## 2012-04-14 NOTE — Progress Notes (Signed)
Cheryl Hinton Date of Birth  05-May-1936       [facility]    [place] [address]  [address] [place], [place]  [address]   [place], [place]  [address] [phone]     [phone]   Fax  [phone]    Fax [phone]  Problem List: 1. History of chest pain 2. Hyperlipidemia 3. Hypertension 4. History of vertigo 5. Gastroesophageal reflux disease   History of Present Illness:  Cheryl Hinton is a 76 yo who presents for further evaluation of her dyspnea.  The episodes are not necessarily associated with exertion. Several times throughout the day she does need to take a deep breath.  She also has significant shortness breath with any sort of exertion such as doing her normal chores.  She swims 1 3/4 hours a day 2 days a week.  She sleeps on 2 pillows at night but this is hard for her to lay flat.  She denies any significant leg swelling.     She denies any fevers or chills. She denies any cough or sputum production.  She had an echo that revealed normal LV function.  A stress myoview revealed no ischemia and normal LV function.  She has started walking at her church. She's not had any problems.   Current Outpatient Prescriptions on File Prior to Visit  Medication Sig Dispense Refill  . ALPRAZolam (XANAX) 0.5 MG tablet Take 0.5 mg by mouth at bedtime as needed.       Marland Kitchen atenolol (TENORMIN) 50 MG tablet Take 25 mg by mouth daily.       . Calcium Carbonate-Vit D-Min (CALCIUM 1200 PO) Take by mouth daily.      . Cholecalciferol (VITAMIN D-3 PO) Take by mouth daily.      Marland Kitchen doxepin (SINEQUAN) 50 MG capsule Take 50 mg by mouth at bedtime.       Marland Kitchen estradiol (ESTRACE) 0.5 MG tablet Take 0.5 mg by mouth daily.       . fish oil-omega-3 fatty acids 1000 MG capsule Take 1 g by mouth daily.      Marland Kitchen HYDROcodone-acetaminophen (NORCO) 10-325 MG per tablet Take 2 tablets by mouth every 4 (four) hours as needed.       Marland Kitchen  lisinopril-hydrochlorothiazide (PRINZIDE,ZESTORETIC) 20-25 MG per tablet Take 1 tablet by mouth daily.       . Multiple Vitamins-Minerals (EYE VITAMINS PO) Take by mouth daily.      . pantoprazole (PROTONIX) 40 MG tablet TAKE 1 TABLET (40 MG TOTAL) BY MOUTH DAILY.  30 tablet  1  . potassium chloride (K-DUR,KLOR-CON) 10 MEQ tablet Take 10 mEq by mouth daily.       . pregabalin (LYRICA) 50 MG capsule Take 50 mg by mouth daily.      . Red Yeast Rice Extract (RED YEAST RICE PO) Take by mouth 3 (three) times daily.      . sertraline (ZOLOFT) 100 MG tablet Take 100 mg by mouth daily.         Allergies  Allergen Reactions  . Nsaids     Past Medical History  Diagnosis Date  . Hypertension   . Hyperlipidemia     Past Surgical History  Procedure Date  . Tonsillectomy   . Appendectomy   . Knee surgery   . Foot surgery   . Breast biopsy   . Hemorroidectomy   . Bladder removal     History  Smoking  status  . Former Smoker  Smokeless tobacco  . Not on file    History  Alcohol Use: Not on file    No family history on file.  Reviw of Systems:  Reviewed in the HPI.  All other systems are negative.  Physical Exam: Blood pressure 118/76, pulse 74, height 5\' 3"  (1.6 m), weight 177 lb (80.287 kg). General: Well developed, well nourished, in no acute distress.  Head: Normocephalic, atraumatic, sclera non-icteric, mucus membranes are moist,   Neck: Supple. Carotids are 2 + without bruits. No JVD  Lungs: Clear bilaterally to auscultation.  Heart: regular rate.  normal  S1 S2. No murmurs, gallops or rubs.  Abdomen: Soft, non-tender, non-distended with normal bowel sounds. No hepatomegaly. No rebound/guarding. No masses.  Msk:  Strength and tone are normal  Extremities: No clubbing or cyanosis. No edema.  Distal pedal pulses are 2+ and equal bilaterally.  Neuro: Alert and oriented X 3. Moves all extremities spontaneously.  Psych:  Responds to questions appropriately with a  normal affect.  ECG: 01/13/2012-normal sinus rhythm at 64 beats a minute. There are no  ST or T wave changes.  Assessment / Plan:

## 2012-04-14 NOTE — Assessment & Plan Note (Signed)
Cheryl Hinton is doing well. Her echocardiogram and her stress Myoview study were both within normal limits. I do not think that her dyspnea is related to a cardiac problem. I have encouraged her to walk on right basis. We will see her on an as-needed basis.

## 2012-04-14 NOTE — Patient Instructions (Addendum)
Your physician recommends that you schedule a follow-up appointment in: as needed basis  

## 2012-06-27 ENCOUNTER — Other Ambulatory Visit: Payer: Self-pay | Admitting: Gastroenterology

## 2012-06-29 NOTE — Telephone Encounter (Signed)
PLEASE MAKE FOLLOW UP APPOINTMENT FOR FURTHER REFILLS

## 2012-08-24 ENCOUNTER — Other Ambulatory Visit: Payer: Self-pay | Admitting: Gastroenterology

## 2012-10-03 ENCOUNTER — Other Ambulatory Visit: Payer: Self-pay | Admitting: Gastroenterology

## 2012-10-05 ENCOUNTER — Other Ambulatory Visit: Payer: Self-pay | Admitting: Gastroenterology

## 2012-12-13 ENCOUNTER — Other Ambulatory Visit: Payer: Self-pay | Admitting: Gastroenterology

## 2012-12-16 ENCOUNTER — Telehealth: Payer: Self-pay | Admitting: Gastroenterology

## 2012-12-16 MED ORDER — PANTOPRAZOLE SODIUM 40 MG PO TBEC: 40.0000 mg | DELAYED_RELEASE_TABLET | Freq: Every day | ORAL | Status: DC

## 2012-12-16 NOTE — Telephone Encounter (Signed)
No voice mail to leave message on, just rang and rang.

## 2013-01-05 ENCOUNTER — Ambulatory Visit (INDEPENDENT_AMBULATORY_CARE_PROVIDER_SITE_OTHER): Payer: Medicare PPO | Admitting: Gastroenterology

## 2013-01-05 ENCOUNTER — Encounter: Payer: Self-pay | Admitting: Gastroenterology

## 2013-01-05 VITALS — BP 128/78 | HR 63 | Ht 63.0 in | Wt 178.2 lb

## 2013-01-05 DIAGNOSIS — Z86010 Personal history of colonic polyps: Secondary | ICD-10-CM

## 2013-01-05 DIAGNOSIS — K219 Gastro-esophageal reflux disease without esophagitis: Secondary | ICD-10-CM

## 2013-01-05 MED ORDER — PANTOPRAZOLE SODIUM 40 MG PO TBEC: 40.0000 mg | DELAYED_RELEASE_TABLET | Freq: Every day | ORAL | Status: DC

## 2013-01-05 NOTE — Progress Notes (Signed)
This is a very pleasant 77 year old Caucasian female with chronic GERD doing well on daily Protonix 40 mg.  She currently is completely asymptomatic and denies upper or lower GI complaints or any hepatobiliary problems.  Her mother had colon cancer at age 19, the patient had a negative colonoscopy 4 years ago.  Previous endoscopies have not shown any evidence of Barrett's mucosa.  She has some 30 years status post hemorrhoid surgery.  Current Medications, Allergies, Past Medical History, Past Surgical History, Family History and Social History were reviewed in [employer] record.  ROS: All systems were reviewed and are negative unless otherwise stated in the HPI.          Physical Exam: Blood pressure 120/78, pulse 63 and regular and weight 178 the BMI of 31.58.  Cannot appreciate stigmata of chronic liver disease.  Her abdomen shows no organomegaly, masses or tenderness.  Bowel sounds are normal.  Mental status is normal.    Assessment and Plan: Chronic GERD doing well on daily PPI therapy without any history of dysphagia.  I've renewed her prescription reviewed antireflux maneuvers.  We will do IFOB stool cards.  She probably should have colonoscopy in 1 year's time although she is reluctant to do so.  She is continue other medications and followup with Dr. Catha Gosselin as previously planned.   Please copy her primary care physician, referring physician, and pertinent subspecialists.

## 2013-01-05 NOTE — Patient Instructions (Signed)
  Your physician has requested that you go to the basement for the following lab work before leaving today: IFOB's  Refill for Protonix was sent to your pharmacy  _____________________________________________                                               We are excited to introduce MyChart, a new best-in-class service that provides you online access to important information in your electronic medical record. We want to make it easier for you to view your health information - all in one secure location - when and where you need it. We expect MyChart will enhance the quality of care and service we provide.  When you register for MyChart, you can:    View your test results.    Request appointments and receive appointment reminders via email.    Request medication renewals.    View your medical history, allergies, medications and immunizations.    Communicate with your physician's office through a password-protected site.    Conveniently print information such as your medication lists.  To find out if MyChart is right for you, please talk to a member of our clinical staff today. We will gladly answer your questions about this free health and wellness tool.  If you are age 50 or older and want a member of your family to have access to your record, you must provide written consent by completing a proxy form available at our office. Please speak to our clinical staff about guidelines regarding accounts for patients younger than age 41.  As you activate your MyChart account and need any technical assistance, please call the MyChart technical support line at [phone]) or email your question to [email or web address]. If you email your question(s), please include your name, a return phone number and the best time to reach you.  If you have non-urgent health-related questions, you can send a message to our office through MyChart at [email or web address]. If you have a  medical emergency, call 911.  Thank you for using MyChart as your new health and wellness resource!   MyChart licensed from [employer],  [id number]. Patents Pending.

## 2014-08-15 DIAGNOSIS — H2513 Age-related nuclear cataract, bilateral: Secondary | ICD-10-CM | POA: Diagnosis not present

## 2014-10-31 DIAGNOSIS — L237 Allergic contact dermatitis due to plants, except food: Secondary | ICD-10-CM | POA: Diagnosis not present

## 2014-11-08 DIAGNOSIS — M4726 Other spondylosis with radiculopathy, lumbar region: Secondary | ICD-10-CM | POA: Diagnosis not present

## 2014-11-08 DIAGNOSIS — Z79891 Long term (current) use of opiate analgesic: Secondary | ICD-10-CM | POA: Diagnosis not present

## 2014-11-08 DIAGNOSIS — G894 Chronic pain syndrome: Secondary | ICD-10-CM | POA: Diagnosis not present

## 2014-11-08 DIAGNOSIS — M170 Bilateral primary osteoarthritis of knee: Secondary | ICD-10-CM | POA: Diagnosis not present

## 2014-12-19 DIAGNOSIS — M4807 Spinal stenosis, lumbosacral region: Secondary | ICD-10-CM | POA: Diagnosis not present

## 2015-01-03 DIAGNOSIS — G894 Chronic pain syndrome: Secondary | ICD-10-CM | POA: Diagnosis not present

## 2015-01-03 DIAGNOSIS — Z79891 Long term (current) use of opiate analgesic: Secondary | ICD-10-CM | POA: Diagnosis not present

## 2015-01-03 DIAGNOSIS — M47812 Spondylosis without myelopathy or radiculopathy, cervical region: Secondary | ICD-10-CM | POA: Diagnosis not present

## 2015-01-03 DIAGNOSIS — M4726 Other spondylosis with radiculopathy, lumbar region: Secondary | ICD-10-CM | POA: Diagnosis not present

## 2015-02-07 DIAGNOSIS — Z1231 Encounter for screening mammogram for malignant neoplasm of breast: Secondary | ICD-10-CM | POA: Diagnosis not present

## 2015-04-04 DIAGNOSIS — Z79891 Long term (current) use of opiate analgesic: Secondary | ICD-10-CM | POA: Diagnosis not present

## 2015-04-04 DIAGNOSIS — M47812 Spondylosis without myelopathy or radiculopathy, cervical region: Secondary | ICD-10-CM | POA: Diagnosis not present

## 2015-04-04 DIAGNOSIS — G894 Chronic pain syndrome: Secondary | ICD-10-CM | POA: Diagnosis not present

## 2015-04-04 DIAGNOSIS — M4726 Other spondylosis with radiculopathy, lumbar region: Secondary | ICD-10-CM | POA: Diagnosis not present

## 2015-12-22 ENCOUNTER — Ambulatory Visit: Payer: Self-pay | Admitting: Podiatry

## 2015-12-29 ENCOUNTER — Ambulatory Visit: Payer: Self-pay | Admitting: Podiatry

## 2016-01-10 ENCOUNTER — Ambulatory Visit (INDEPENDENT_AMBULATORY_CARE_PROVIDER_SITE_OTHER): Payer: Medicare Other | Admitting: Podiatry

## 2016-01-10 VITALS — BP 134/76 | HR 60

## 2016-01-10 DIAGNOSIS — B351 Tinea unguium: Secondary | ICD-10-CM | POA: Diagnosis not present

## 2016-01-10 DIAGNOSIS — M79675 Pain in left toe(s): Secondary | ICD-10-CM | POA: Diagnosis not present

## 2016-01-10 DIAGNOSIS — M79674 Pain in right toe(s): Secondary | ICD-10-CM | POA: Diagnosis not present

## 2016-01-10 DIAGNOSIS — G629 Polyneuropathy, unspecified: Secondary | ICD-10-CM | POA: Diagnosis not present

## 2016-01-10 NOTE — Progress Notes (Signed)
   Subjective:    Patient ID: Abbott PaoShirley J Rindfleisch, female    DOB: 09/22/1935, 80 y.o.   MRN: [id number]  HPI 80 year old Female presents the office today for concerns of thick, painful, elongated toenails that she cannot trim herself. She states the nails are very painful particularly with pressure in shoe gear. Denies any redness or drainage. She also has neuropathy for which she is on Lyrica and vitamin B. No other complaints at this time. Denies any open sores.   Review of Systems  Musculoskeletal: Positive for back pain and gait problem.  All other systems reviewed and are negative.      Objective:   Physical Exam General: AAO x3, NAD  Dermatological: Nails are hypertrophic, dystrophic, brittle, discolored, elongated 10. No surrounding redness or drainage. Tenderness nails 1-5 bilaterally. No open lesions or pre-ulcerative lesions identified at this time.  Vascular: Dorsalis Pedis artery and Posterior Tibial artery pedal pulses are 2/4 bilateral with immedate capillary fill time. Pedal hair growth present. There is no pain with calf compression, swelling, warmth, erythema.   Neruologic: Sensation decreased with Dorann OuSimms Weinstein monofilament.   Musculoskeletal: No gross boney pedal deformities bilateral. No pain, crepitus, or limitation noted with foot and ankle range of motion bilateral. Muscular strength 5/5 in all groups tested bilateral.  Gait: Unassisted, Nonantalgic.      Assessment & Plan:  80 year old female with symptomatic onychomycosis -Treatment options discussed including all alternatives, risks, and complications -Etiology of symptoms were discussed -Nails debrided 10 without complications or bleeding. -Daily foot inspection -Follow-up in 3 months or sooner if any problems arise. In the meantime, encouraged to call the office with any questions, concerns, change in symptoms.   Ovid CurdMatthew Wagoner, DPM

## 2016-04-09 ENCOUNTER — Ambulatory Visit: Payer: Medicare Other | Admitting: Sports Medicine

## 2016-04-27 ENCOUNTER — Emergency Department (HOSPITAL_COMMUNITY)
Admission: EM | Admit: 2016-04-27 | Discharge: 2016-04-28 | Disposition: A | Discharge status: Home / Self Care | Payer: Medicare Other | Attending: Emergency Medicine | Admitting: Emergency Medicine

## 2016-04-27 ENCOUNTER — Emergency Department (HOSPITAL_COMMUNITY): Payer: Medicare Other

## 2016-04-27 ENCOUNTER — Encounter (HOSPITAL_COMMUNITY): Payer: Self-pay | Admitting: Emergency Medicine

## 2016-04-27 DIAGNOSIS — Z79899 Other long term (current) drug therapy: Secondary | ICD-10-CM | POA: Diagnosis not present

## 2016-04-27 DIAGNOSIS — Z87891 Personal history of nicotine dependence: Secondary | ICD-10-CM | POA: Diagnosis not present

## 2016-04-27 DIAGNOSIS — R51 Headache: Secondary | ICD-10-CM | POA: Insufficient documentation

## 2016-04-27 DIAGNOSIS — I160 Hypertensive urgency: Secondary | ICD-10-CM

## 2016-04-27 DIAGNOSIS — R519 Headache, unspecified: Secondary | ICD-10-CM

## 2016-04-27 LAB — URINALYSIS, ROUTINE W REFLEX MICROSCOPIC
Bilirubin Urine: NEGATIVE
Glucose, UA: NEGATIVE mg/dL
Hgb urine dipstick: NEGATIVE
Ketones, ur: 15 mg/dL — AB
Leukocytes, UA: NEGATIVE
Nitrite: NEGATIVE
Protein, ur: NEGATIVE mg/dL
Specific Gravity, Urine: 1.015 (ref 1.005–1.030)
pH: 8 (ref 5.0–8.0)

## 2016-04-27 LAB — URINE MICROSCOPIC-ADD ON
Bacteria, UA: NONE SEEN
RBC / HPF: NONE SEEN RBC/hpf (ref 0–5)
Squamous Epithelial / LPF: NONE SEEN
WBC, UA: NONE SEEN WBC/hpf (ref 0–5)

## 2016-04-27 LAB — CBC WITH DIFFERENTIAL/PLATELET
Basophils Absolute: 0.1 10*3/uL (ref 0.0–0.1)
Basophils Relative: 1 %
Eosinophils Absolute: 0.3 10*3/uL (ref 0.0–0.7)
Eosinophils Relative: 5 %
HCT: 37.7 % (ref 36.0–46.0)
Hemoglobin: 12.5 g/dL (ref 12.0–15.0)
Lymphocytes Relative: 29 %
Lymphs Abs: 1.8 10*3/uL (ref 0.7–4.0)
MCH: 30 pg (ref 26.0–34.0)
MCHC: 33.2 g/dL (ref 30.0–36.0)
MCV: 90.6 fL (ref 78.0–100.0)
Monocytes Absolute: 0.6 10*3/uL (ref 0.1–1.0)
Monocytes Relative: 9 %
Neutro Abs: 3.6 10*3/uL (ref 1.7–7.7)
Neutrophils Relative %: 56 %
Platelets: 252 10*3/uL (ref 150–400)
RBC: 4.16 MIL/uL (ref 3.87–5.11)
RDW: 14.1 % (ref 11.5–15.5)
WBC: 6.3 10*3/uL (ref 4.0–10.5)

## 2016-04-27 LAB — COMPREHENSIVE METABOLIC PANEL
ALT: 21 U/L (ref 14–54)
AST: 26 U/L (ref 15–41)
Albumin: 3.9 g/dL (ref 3.5–5.0)
Alkaline Phosphatase: 77 U/L (ref 38–126)
Anion gap: 10 (ref 5–15)
BUN: 14 mg/dL (ref 6–20)
CO2: 30 mmol/L (ref 22–32)
Calcium: 8.9 mg/dL (ref 8.9–10.3)
Chloride: 100 mmol/L — ABNORMAL LOW (ref 101–111)
Creatinine, Ser: 0.51 mg/dL (ref 0.44–1.00)
GFR calc Af Amer: >60 mL/min (ref >60)
GFR calc non Af Amer: >60 mL/min (ref >60)
Glucose, Bld: 87 mg/dL (ref 65–99)
Potassium: 3.1 mmol/L — ABNORMAL LOW (ref 3.5–5.1)
Sodium: 140 mmol/L (ref 135–145)
Total Bilirubin: 0.8 mg/dL (ref 0.3–1.2)
Total Protein: 7 g/dL (ref 6.5–8.1)

## 2016-04-27 MED ORDER — FENTANYL CITRATE (PF) 100 MCG/2ML IJ SOLN
25.0000 ug | Freq: Once | INTRAMUSCULAR | Status: AC
  Administered 2016-04-27: 25 ug via INTRAVENOUS
  Filled 2016-04-27: qty 2

## 2016-04-27 MED ORDER — ATENOLOL 50 MG PO TABS: 50.0000 mg | ORAL_TABLET | Freq: Every day | ORAL | 0 refills | Status: DC

## 2016-04-27 MED ORDER — HALOPERIDOL LACTATE 5 MG/ML IJ SOLN
1.0000 mg | Freq: Once | INTRAMUSCULAR | Status: AC
  Administered 2016-04-27: 1 mg via INTRAVENOUS
  Filled 2016-04-27: qty 1

## 2016-04-27 MED ORDER — HYDROCODONE-ACETAMINOPHEN 5-325 MG PO TABS
1.0000 | ORAL_TABLET | Freq: Once | ORAL | Status: AC
  Administered 2016-04-27: 1 via ORAL
  Filled 2016-04-27: qty 1

## 2016-04-27 NOTE — Discharge Instructions (Signed)
As discussed, your evaluation today has been largely reassuring.  But, it is important that you monitor your condition carefully, and do not hesitate to return to the ED if you develop new, or concerning changes in your condition. ? ?Otherwise, please follow-up with your physician for appropriate ongoing care. ? ?

## 2016-04-27 NOTE — ED Notes (Signed)
Bed: WA04 Expected date:  Expected time:  Means of arrival:  Comments: 80 yo- fall, urinary frequency, hyperglycemia

## 2016-04-27 NOTE — ED Triage Notes (Signed)
Pt complaint of intermittent frontal headache and light sensitivity for 10 days. Pt hypertensive. Pt complaint with blood pressure medication. Neurological exam unremarkable.

## 2016-04-27 NOTE — ED Provider Notes (Signed)
[facility]-EMERGENCY DEPT Provider Note   CSN: [id number] Arrival date & time: 04/27/16  1711     History   Chief Complaint Chief Complaint  Patient presents with  . Headache    HPI Cheryl Hinton is a 80 y.o. female.  HPI   Patient presents with concern of headache. Headache is diffuse, sore, has been intermittently present and severe over the past few days. Does not seem as though there is a clear precipitant, and the patient has mild relief with home narcotics, taken for back pain. No vision changes, no new weakness in either upper extremity, no confusion, no disorientation, no nausea, no vomiting. Sedation the patient went to her primary care physician due to the headache. She was found to have blood pressure substantially elevated, systolic, 230. Patient received clonidine, was sent here for evaluation.   Past Medical History:  Diagnosis Date  . Arthritis   . Barrett's esophagus   . Colon polyps 1985  . Diverticulosis   . Endometriosis   . GERD (gastroesophageal reflux disease)   . Hyperlipidemia   . Hypertension   . IBS (irritable bowel syndrome)   . Status post dilation of esophageal narrowing     Patient Active Problem List   Diagnosis Date Noted  . Shortness of breath 01/13/2012  . NAUSEA 05/25/2010  . BARRETTS ESOPHAGUS 01/10/2009  . CONSTIPATION 01/10/2009  . GERD 11/20/2007  . DYSPHAGIA 11/20/2007  . HYPERLIPIDEMIA 11/19/2007  . HYPERTENSION 11/19/2007  . ESOPHAGEAL STRICTURE 11/19/2007  . GASTRITIS 11/19/2007  . DIVERTICULOSIS, COLON 11/19/2007  . DEGENERATIVE JOINT DISEASE 11/19/2007    Past Surgical History:  Procedure Laterality Date  . APPENDECTOMY  1956  . BREAST BIOPSY Left   . FOOT SURGERY Bilateral    tumor removed  . HEMORROIDECTOMY    . KNEE SURGERY Bilateral [phone]   x 3  . PARTIAL HYSTERECTOMY    . TONSILLECTOMY    . TOTAL ABDOMINAL HYSTERECTOMY     for endometriosis    OB History    No data available        Home Medications    Prior to Admission medications   Medication Sig Start Date End Date Taking? Authorizing Provider  ALPRAZolam Prudy Feeler) 0.5 MG tablet Take 0.5 mg by mouth at bedtime as needed.  11/27/11   Historical Provider, MD  atenolol (TENORMIN) 25 MG tablet Take 25 mg by mouth daily. 02/29/16   Historical Provider, MD  atenolol (TENORMIN) 50 MG tablet Take 25 mg by mouth daily.  12/03/11   Historical Provider, MD  Calcium Carbonate-Vit D-Min (CALCIUM 1200 PO) Take by mouth daily.    Historical Provider, MD  Cholecalciferol (VITAMIN D-3 PO) Take by mouth daily.    Historical Provider, MD  doxepin (SINEQUAN) 50 MG capsule Take 50 mg by mouth at bedtime.  12/04/11   Historical Provider, MD  estradiol (ESTRACE) 0.5 MG tablet Take 0.5 mg by mouth daily.  12/22/11   Historical Provider, MD  fish oil-omega-3 fatty acids 1000 MG capsule Take 1 g by mouth daily.    Historical Provider, MD  HYDROcodone-acetaminophen (NORCO) 10-325 MG per tablet Take 2 tablets by mouth every 4 (four) hours as needed.  01/08/12   Historical Provider, MD  lisinopril-hydrochlorothiazide (PRINZIDE,ZESTORETIC) 20-25 MG per tablet Take 1 tablet by mouth daily.  12/03/11   Historical Provider, MD  Multiple Vitamins-Minerals (EYE VITAMINS PO) Take by mouth daily.    Historical Provider, MD  pantoprazole (PROTONIX) 40 MG tablet Take 1 tablet (40 mg total)  by mouth daily. 01/05/13   Mardella Laymanavid R Patterson, MD  potassium chloride (K-DUR,KLOR-CON) 10 MEQ tablet Take 10 mEq by mouth daily.  12/03/11   Historical Provider, MD  pregabalin (LYRICA) 50 MG capsule Take 50 mg by mouth daily.    Historical Provider, MD  Red Yeast Rice Extract (RED YEAST RICE PO) Take by mouth 3 (three) times daily.    Historical Provider, MD  sertraline (ZOLOFT) 100 MG tablet Take 100 mg by mouth daily.  12/04/11   Historical Provider, MD    Family History Family History  Problem Relation Age of Onset  . Heart disease Father   . Colon cancer Mother   .  Diabetes Mother     Social History Social History  Substance Use Topics  . Smoking status: Former Smoker    Types: Cigarettes    Quit date: 05/28/1967  . Smokeless tobacco: Never Used  . Alcohol use No     Allergies   Nsaids   Review of Systems Review of Systems  Constitutional:       Per HPI, otherwise negative  HENT:       Per HPI, otherwise negative  Respiratory:       Per HPI, otherwise negative  Cardiovascular:       Per HPI, otherwise negative  Gastrointestinal: Negative for vomiting.  Endocrine:       Negative aside from HPI  Genitourinary:       Neg aside from HPI   Musculoskeletal:       Per HPI, otherwise negative  Skin: Negative.   Neurological: Positive for headaches. Negative for syncope.     Physical Exam Updated Vital Signs BP 178/96 (BP Location: Right Arm)   Pulse 70   Temp 97.7 F (36.5 C) (Oral)   Resp 16   SpO2 94%   Physical Exam  Constitutional: She is oriented to person, place, and time. She appears well-developed and well-nourished. No distress.  HENT:  Head: Normocephalic and atraumatic.  Eyes: Conjunctivae and EOM are normal.  Cardiovascular: Normal rate and regular rhythm.   Pulmonary/Chest: Effort normal and breath sounds normal. No stridor. No respiratory distress.  Abdominal: She exhibits no distension.  Musculoskeletal: She exhibits no edema.  Neurological: She is alert and oriented to person, place, and time. She displays atrophy. She displays no tremor. No cranial nerve deficit or sensory deficit. She exhibits normal muscle tone. She displays no seizure activity. Coordination normal.  Skin: Skin is warm and dry.  Psychiatric: She has a normal mood and affect.  Nursing note and vitals reviewed.    ED Treatments / Results  Labs (all labs ordered are listed, but only abnormal results are displayed) Labs Reviewed  COMPREHENSIVE METABOLIC PANEL - Abnormal; Notable for the following:       Result Value   Potassium 3.1 (*)     Chloride 100 (*)    All other components within normal limits  CBC WITH DIFFERENTIAL/PLATELET  URINALYSIS, ROUTINE W REFLEX MICROSCOPIC (NOT AT [facility])    EKG  EKG Interpretation None       Radiology Ct Head Wo Contrast  Result Date: 04/27/2016 CLINICAL DATA:  Hypertensive urgency. Intermittent frontal headaches and light sensitivity. EXAM: CT HEAD WITHOUT CONTRAST TECHNIQUE: Contiguous axial images were obtained from the base of the skull through the vertex without intravenous contrast. COMPARISON:  None. FINDINGS: Brain: No evidence of parenchymal hemorrhage or extra-axial fluid collection. No mass lesion, mass effect, or midline shift. No CT evidence of acute infarction. Cerebral  volume is age appropriate. No ventriculomegaly. Vascular: No hyperdense vessel or unexpected calcification. Skull: No evidence of calvarial fracture. Sinuses/Orbits: The visualized paranasal sinuses are essentially clear. Other:  The mastoid air cells are unopacified. IMPRESSION: Negative head CT.  No evidence of acute intracranial abnormality. Electronically Signed   By: Delbert PhenixJason A Poff M.D.   On: 04/27/2016 19:19    Procedures Procedures (including critical care time)  Medications Ordered in ED Medications  HYDROcodone-acetaminophen (NORCO/VICODIN) 5-325 MG per tablet 1 tablet (1 tablet Oral Given 04/27/16 1845)     Initial Impression / Assessment and Plan / ED Course  I have reviewed the triage vital signs and the nursing notes.  Pertinent labs & imaging results that were available during my care of the patient were reviewed by me and considered in my medical decision making (see chart for details).  Clinical Course     11:43 PM Now, after multiple medications were provided, patient's blood pressure remains lower than on arrival, headache improved.  We discussed all findings again, reassuring CT scan, labs, vitals. Patient will follow up with primary care, be discharged with increased  antihypertensive medication.  Absent neurologic deficits, distress, and with improved blood pressure, patient propria for outpatient follow-up.  Final Clinical Impressions(s) / ED Diagnoses   Final diagnoses:  Bad headache  Hypertensive urgency     Gerhard Munchobert Lockwood, MD 04/27/16 2344

## 2016-11-04 ENCOUNTER — Telehealth: Payer: Self-pay | Admitting: Gastroenterology

## 2016-11-04 NOTE — Telephone Encounter (Signed)
Dr. Russella DarStark indicated last week that Dr. Jarold MottoPatterson patients can go to any MD without permission.

## 2016-11-04 NOTE — Telephone Encounter (Signed)
Left message for patient to call back and schedule appointment.

## 2016-12-13 ENCOUNTER — Encounter (INDEPENDENT_AMBULATORY_CARE_PROVIDER_SITE_OTHER): Payer: Self-pay

## 2016-12-13 ENCOUNTER — Encounter: Payer: Self-pay | Admitting: Gastroenterology

## 2016-12-13 ENCOUNTER — Ambulatory Visit (INDEPENDENT_AMBULATORY_CARE_PROVIDER_SITE_OTHER): Payer: Medicare Other | Admitting: Gastroenterology

## 2016-12-13 VITALS — BP 100/74 | HR 72 | Ht 63.0 in | Wt 158.1 lb

## 2016-12-13 DIAGNOSIS — K219 Gastro-esophageal reflux disease without esophagitis: Secondary | ICD-10-CM

## 2016-12-13 DIAGNOSIS — Z8719 Personal history of other diseases of the digestive system: Secondary | ICD-10-CM

## 2016-12-13 DIAGNOSIS — R1310 Dysphagia, unspecified: Secondary | ICD-10-CM

## 2016-12-13 MED ORDER — ESOMEPRAZOLE MAGNESIUM 20 MG PO CPDR: 20.0000 mg | DELAYED_RELEASE_CAPSULE | Freq: Every morning | ORAL | Status: DC

## 2016-12-13 NOTE — Progress Notes (Signed)
History of Present Illness: This is a 81 year old female for the evaluation of difficulty swallowing. She was previously followed by Dr. Jarold MottoPatterson. She has a history of esophageal strictures with dilations performed in the past. She relates difficulties intermittently swallowing solid foods for the past several months. She relates one episode when she was eating crackers and she had dysphagia and then several days of odynophagia that followed. EGD in 2007 showed Barrett's esophagus on biopsies of an esophageal stricture however Barrett's mucosa was not described on this EGD or her other EGDs. She notes frequent morning nausea and reflux symptoms and states she stopped her PPI several months ago due to reports of dementia and renal failure. She is quite concerned she has Barrett's esophagus although endoscopic evidence of Barrett's was not noted on any endoscopy. Barrett's was noted by the pathologist on biopsy of an esophageal stricture in 2007. Barium esophagram in 2009 below. Denies weight loss, abdominal pain, constipation, diarrhea, change in stool caliber, melena, hematochezia, vomiting, chest pain.  EGD: EGJ stricture, dilated Colonoscopy 12/2008: normal   Barium esophagram 12/2007 IMPRESSION: Esophageal dysmotility of a moderate degree in the lower half of the esophagus. Small right lateral pharyngeal diverticulum   Allergies  Allergen Reactions  . Nsaids    Outpatient Medications Prior to Visit  Medication Sig Dispense Refill  . ALPRAZolam (XANAX) 0.5 MG tablet Take 0.5 mg by mouth at bedtime as needed.     Marland Kitchen. atenolol (TENORMIN) 50 MG tablet Take 1 tablet (50 mg total) by mouth daily. 30 tablet 0  . Calcium Carbonate-Vit D-Min (CALCIUM 1200 PO) Take by mouth daily.    . cyclobenzaprine (FLEXERIL) 10 MG tablet Take 10 mg by mouth daily as needed for muscle spasms.    . fish oil-omega-3 fatty acids 1000 MG capsule Take 1 g by mouth daily.    Marland Kitchen. lisinopril-hydrochlorothiazide  (PRINZIDE,ZESTORETIC) 20-25 MG per tablet Take 1 tablet by mouth daily.     . Multiple Vitamins-Minerals (EYE VITAMINS PO) Take by mouth daily.    . OXYCODONE-ACETAMINOPHEN PO Take 1 tablet by mouth every 4 (four) hours as needed (pain).    . potassium chloride (K-DUR,KLOR-CON) 10 MEQ tablet Take 10 mEq by mouth daily.     . pregabalin (LYRICA) 50 MG capsule Take 50 mg by mouth daily.    . Red Yeast Rice Extract (RED YEAST RICE PO) Take by mouth 3 (three) times daily.    . sertraline (ZOLOFT) 100 MG tablet Take 100 mg by mouth daily.     . pantoprazole (PROTONIX) 40 MG tablet Take 1 tablet (40 mg total) by mouth daily. (Patient not taking: Reported on 12/13/2016) 30 tablet 11   No facility-administered medications prior to visit.    Past Medical History:  Diagnosis Date  . Arthritis   . Barrett's esophagus   . Colon polyps 1985  . Diverticulosis   . Endometriosis   . Esophageal stricture   . GERD (gastroesophageal reflux disease)   . Hyperlipidemia   . Hypertension   . IBS (irritable bowel syndrome)   . Status post dilation of esophageal narrowing    Past Surgical History:  Procedure Laterality Date  . APPENDECTOMY  1956  . BREAST BIOPSY Left   . FOOT SURGERY Bilateral    tumor removed  . HEMORROIDECTOMY    . KNEE SURGERY Bilateral [phone],1994   x 3  . PARTIAL HYSTERECTOMY    . TONSILLECTOMY    . TOTAL ABDOMINAL HYSTERECTOMY  for endometriosis   Social History   Social History  . Marital status: Widowed    Spouse name: N/A  . Number of children: 1  . Years of education: N/A   Occupational History  . RETIRED Retired   Social History Main Topics  . Smoking status: Former Smoker    Types: Cigarettes    Quit date: 05/28/1967  . Smokeless tobacco: Never Used  . Alcohol use No  . Drug use: No  . Sexual activity: Not Asked   Other Topics Concern  . None   Social History Narrative  . None   Family History  Problem Relation Age of Onset  . Heart disease  Father   . Heart failure Sister   . Colon cancer Mother   . Diabetes Mother        Review of Systems: Pertinent positive and negative review of systems were noted in the above HPI section. All other review of systems were otherwise negative.   Physical Exam: General: Well developed, well nourished, no acute distress Head: Normocephalic and atraumatic Eyes:  sclerae anicteric, EOMI Ears: Normal auditory acuity Mouth: No deformity or lesions Neck: Supple, no masses or thyromegaly Lungs: Clear throughout to auscultation Heart: Regular rate and rhythm; no murmurs, rubs or bruits Abdomen: Soft, non tender and non distended. No masses, hepatosplenomegaly or hernias noted. Normal Bowel sounds Musculoskeletal: Symmetrical with no gross deformities  Skin: No lesions on visible extremities Pulses:  Normal pulses noted Extremities: No clubbing, cyanosis, edema or deformities noted Neurological: Alert oriented x 4, grossly nonfocal Cervical Nodes:  No significant cervical adenopathy Inguinal Nodes: No significant inguinal adenopathy Psychological:  Alert and cooperative. Normal mood and affect  Assessment and Recommendations:  1. Dysphagia. GERD. Suspected recurrent stricture. Barrett's mucosa noted on biopsy of esophageal stricture in 2007. Small pharyngeal diverticulum noted in 2009. Patient is advised to begin Nexium OTC 20 mg every morning. She is advised to follow antireflux measures. Schedule barium esophagram. Schedule EGD with possible dilation. The risks (including bleeding, perforation, infection, missed lesions, medication reactions and possible hospitalization or surgery if complications occur), benefits, and alternatives to endoscopy with possible biopsy and possible dilation were discussed with the patient and they consent to proceed.

## 2016-12-13 NOTE — Patient Instructions (Signed)
You have been scheduled for a Bar[facility] Esophogram [facility] (1st floor of the hospital) on 12-25-16 at 9:30am. Please arrive 15 minutes prior to your appointment for registration. Make certain not to have anything to eat or drink 3 hours prior to your test. If you need to reschedule for any reason, please contact radiology at [phone] to do so. __________________________________________________________________ A barium swallow is an examination that concentrates on views of the esophagus. This tends to be a double contrast exam (barium and two liquids which, when combined, create a gas to distend the wall of the oesophagus) or single contrast (non-ionic iodine based). The study is usually tailored to your symptoms so a good history is essential. Attention is paid during the study to the form, structure and configuration of the esophagus, looking for functional disorders (such as aspiration, dysphagia, achalasia, motility and reflux) EXAMINATION You may be asked to change into a gown, depending on the type of swallow being performed. A radiologist and radiographer will perform the procedure. The radiologist will advise you of the type of contrast selected for your procedure and direct you during the exam. You will be asked to stand, sit or lie in several different positions and to hold a small amount of fluid in your mouth before being asked to swallow while the imaging is performed .In some instances you may be asked to swallow barium coated marshmallows to assess the motility of a solid food bolus. The exam can be recorded as a digital or video fluoroscopy procedure. POST PROCEDURE It will take 1-2 days for the barium to pass through your system. To facilitate this, it is important, unless otherwise directed, to increase your fluids for the next 24-48hrs and to resume your normal diet.  This test typically takes about 30 minutes to  perform. ________________________________________________________________________   Bonita QuinYou have been scheduled for an endoscopy. Please follow written instructions given to you at your visit today. If you use inhalers (even only as needed), please bring them with you on the day of your procedure. Your physician has requested that you go to www.startemmi.com and enter the access code given to you at your visit today. This web site gives a general overview about your procedure. However, you should still follow specific instructions given to you by our office regarding your preparation for the procedure.  Start taking the Nexium over the counter 20 mg every morning.  Patient advised to avoid spicy, acidic, citrus, chocolate, mints, fruit and fruit juices.  Limit the intake of caffeine, alcohol and Soda.  Don't exercise too soon after eating.  Don't lie down within 3-4 hours of eating.  Elevate the head of your bed.   Normal BMI (Body Mass Index- based on height and weight) is between 23 and 30. Your BMI today is Body mass index is 28.01 kg/m. Marland Kitchen. Please consider follow up  regarding your BMI with your Primary Care Provider.  Thank you for choosing me and [facility].  Venita LickMalcolm T. Pleas KochStark, Jr., MD., Clementeen GrahamFACG

## 2016-12-17 ENCOUNTER — Ambulatory Visit (AMBULATORY_SURGERY_CENTER): Payer: Medicare Other | Admitting: Gastroenterology

## 2016-12-17 ENCOUNTER — Encounter: Payer: Self-pay | Admitting: Gastroenterology

## 2016-12-17 VITALS — BP 159/74 | HR 57 | Temp 97.7°F | Resp 13 | Ht 63.0 in | Wt 158.0 lb

## 2016-12-17 DIAGNOSIS — R1310 Dysphagia, unspecified: Secondary | ICD-10-CM

## 2016-12-17 DIAGNOSIS — K219 Gastro-esophageal reflux disease without esophagitis: Secondary | ICD-10-CM

## 2016-12-17 MED ORDER — SODIUM CHLORIDE 0.9 % IV SOLN: 500.0000 mL | INTRAVENOUS | Status: DC

## 2016-12-17 NOTE — Progress Notes (Signed)
Report given to PACU, vss 

## 2016-12-17 NOTE — Op Note (Signed)
[facility] Patient Name: Cheryl Hinton Procedure Date: 12/17/2016 9:49 AM MRN: [id number] Endoscopist: Meryl Dare , MD Age: 81 Referring MD:  Date of Birth: 05-01-1936 Gender: Female Account #: [id number] Procedure:                Upper GI endoscopy Indications:              Dysphagia, Gastro-esophageal reflux disease Medicines:                Monitored Anesthesia Care Procedure:                Pre-Anesthesia Assessment:                           - Prior to the procedure, a History and Physical                            was performed, and patient medications and                            allergies were reviewed. The patient's tolerance of                            previous anesthesia was also reviewed. The risks                            and benefits of the procedure and the sedation                            options and risks were discussed with the patient.                            All questions were answered, and informed consent                            was obtained. Prior Anticoagulants: The patient has                            taken no previous anticoagulant or antiplatelet                            agents. ASA Grade Assessment: II - A patient with                            mild systemic disease. After reviewing the risks                            and benefits, the patient was deemed in                            satisfactory condition to undergo the procedure.                           After obtaining informed consent, the endoscope was  passed under direct vision. Throughout the                            procedure, the patient's blood pressure, pulse, and                            oxygen saturations were monitored continuously. The                            Endoscope was introduced through the mouth, and                            advanced to the second part of duodenum. The upper                            GI  endoscopy was accomplished without difficulty.                            The patient tolerated the procedure well. Scope In: Scope Out: Findings:                 No endoscopic abnormality was evident in the                            esophagus to explain the patient's complaint of                            dysphagia. It was decided, however, to proceed with                            dilation of the entire esophagus. A guidewire was                            placed and the scope was withdrawn. Dilation was                            performed with a Savary dilator with no resistance                            at 15 mm. No heme.                           The exam of the esophagus was otherwise normal.                           A few localized, small non-bleeding erosions were                            found in the gastric antrum. There were no stigmata                            of recent bleeding. Biopsies were taken with a cold  forceps for histology.                           Diffuse mildly erythematous mucosa without bleeding                            was found in the gastric body. Biopsies were taken                            with a cold forceps for histology.                           The exam of the stomach was otherwise normal.                           The duodenal bulb and second portion of the                            duodenum were normal. Complications:            No immediate complications. Estimated Blood Loss:     Estimated blood loss was minimal. Impression:               - No endoscopic esophageal abnormality to explain                            patient's dysphagia. Esophagus dilated. Dilated.                           - Non-bleeding erosive gastropathy. Biopsied.                           - Erythematous mucosa in the gastric body. Biopsied.                           - Normal duodenal bulb and second portion of the                             duodenum. Recommendation:           - Clear liquid diet for 2 hours, then advance as                            tolerated to soft diet today. Resume prior diet                            tomorrow.                           - Follow antireflux measures.                           - Continue present medications including Nexium OTC                            20 mg po qam long term.                           -  Await pathology results.                           - Written discharge instructions were provided to                            the patient.                           - The signs and symptoms of potential delayed                            complications were discussed with the patient.                           - Patient has a contact number available for                            emergencies. The signs and symptoms of potential                            delayed complications were discussed with the                            patient. Return to normal activities tomorrow.                            Written discharge instructions were provided to the                            patient. Meryl Dare, MD 12/17/2016 10:11:41 AM This report has been signed electronically.

## 2016-12-17 NOTE — Progress Notes (Signed)
Called to room to assist during endoscopic procedure.  Patient ID and intended procedure confirmed with present staff. Received instructions for my participation in the procedure from the performing physician.  

## 2016-12-17 NOTE — Patient Instructions (Signed)
**  Handouts given on Gastritis and Post Esophageal Dilation Diet**   YOU HAD AN ENDOSCOPIC PROCEDURE TODAY: Refer to the procedure report and other information in the discharge instructions given to you for any specific questions about what was found during the examination. If this information does not answer your questions, please call [place] office at [phone] to clarify.   YOU SHOULD EXPECT: Some feelings of bloating in the abdomen. Passage of more gas than usual. Walking can help get rid of the air that was put into your GI tract during the procedure and reduce the bloating. If you had a lower endoscopy (such as a colonoscopy or flexible sigmoidoscopy) you may notice spotting of blood in your stool or on the toilet paper. Some abdominal soreness may be present for a day or two, also.  DIET: Your first meal following the procedure should be a light meal and then it is ok to progress to your normal diet. A half-sandwich or bowl of soup is an example of a good first meal. Heavy or fried foods are harder to digest and may make you feel nauseous or bloated. Drink plenty of fluids but you should avoid alcoholic beverages for 24 hours. If you had a esophageal dilation, please see attached instructions for diet.    ACTIVITY: Your care partner should take you home directly after the procedure. You should plan to take it easy, moving slowly for the rest of the day. You can resume normal activity the day after the procedure however YOU SHOULD NOT DRIVE, use power tools, machinery or perform tasks that involve climbing or major physical exertion for 24 hours (because of the sedation medicines used during the test).   SYMPTOMS TO REPORT IMMEDIATELY: A gastroenterologist can be reached at any hour. Please call [phone]  for any of the following symptoms:    Following upper endoscopy (EGD, EUS, ERCP, esophageal dilation) Vomiting of blood or coffee ground material  New, significant abdominal pain  New,  significant chest pain or pain under the shoulder blades  Painful or persistently difficult swallowing  New shortness of breath  Black, tarry-looking or red, bloody stools  FOLLOW UP:  If any biopsies were taken you will be contacted by phone or by letter within the next 1-3 weeks. Call [phone]  if you have not heard about the biopsies in 3 weeks.  Please also call with any specific questions about appointments or follow up tests.

## 2016-12-18 ENCOUNTER — Telehealth: Payer: Self-pay

## 2016-12-18 NOTE — Telephone Encounter (Signed)
  Follow up Call-  Call back number 12/17/2016  Post procedure Call Back phone  # [phone]  Permission to leave phone message Yes  Some recent data might be hidden     Patient questions:  Do you have a fever, pain , or abdominal swelling? No. Pain Score  0 *  Have you tolerated food without any problems? Yes.    Have you been able to return to your normal activities? Yes.    Do you have any questions about your discharge instructions: Diet   No. Medications  No. Follow up visit  No.  Do you have questions or concerns about your Care? No.  Actions: * If pain score is 4 or above: No action needed, pain <4.

## 2016-12-23 ENCOUNTER — Encounter: Payer: Self-pay | Admitting: Gastroenterology

## 2016-12-25 ENCOUNTER — Ambulatory Visit (HOSPITAL_COMMUNITY)
Admission: RE | Admit: 2016-12-25 | Discharge: 2016-12-25 | Disposition: A | Discharge status: Home / Self Care | Payer: Medicare Other | Source: Ambulatory Visit | Attending: Gastroenterology | Admitting: Gastroenterology

## 2016-12-25 DIAGNOSIS — R1310 Dysphagia, unspecified: Secondary | ICD-10-CM | POA: Diagnosis not present

## 2016-12-25 DIAGNOSIS — Q387 Congenital pharyngeal pouch: Secondary | ICD-10-CM | POA: Diagnosis not present

## 2017-01-13 ENCOUNTER — Ambulatory Visit (HOSPITAL_COMMUNITY)
Admission: RE | Admit: 2017-01-13 | Discharge: 2017-01-13 | Disposition: A | Discharge status: Home / Self Care | Payer: Medicare Other | Source: Ambulatory Visit | Attending: Gastroenterology | Admitting: Gastroenterology

## 2017-01-13 ENCOUNTER — Encounter (HOSPITAL_COMMUNITY)
Admission: RE | Disposition: A | Discharge status: Home / Self Care | Payer: Self-pay | Source: Ambulatory Visit | Attending: Gastroenterology

## 2017-01-13 ENCOUNTER — Encounter (HOSPITAL_COMMUNITY): Payer: Self-pay

## 2017-01-13 DIAGNOSIS — R1310 Dysphagia, unspecified: Secondary | ICD-10-CM | POA: Diagnosis not present

## 2017-01-13 HISTORY — PX: ESOPHAGEAL MANOMETRY: SHX5429

## 2017-01-13 SURGERY — MANOMETRY, ESOPHAGUS

## 2017-01-13 MED ORDER — LIDOCAINE VISCOUS 2 % MT SOLN
OROMUCOSAL | Status: AC
  Filled 2017-01-13: qty 15

## 2017-01-13 SURGICAL SUPPLY — 2 items
FACESHIELD LNG OPTICON STERILE (SAFETY) IMPLANT
GLOVE BIO SURGEON STRL SZ8 (GLOVE) ×4 IMPLANT

## 2017-01-13 NOTE — Progress Notes (Signed)
Esophageal Manometry done per protocol.  Patient tolerated well/without complication.  Dr. Lavon Paganini to be notified today.  Omelia Blackwater, RN

## 2017-01-14 ENCOUNTER — Encounter (HOSPITAL_COMMUNITY): Payer: Self-pay | Admitting: Gastroenterology

## 2017-01-15 ENCOUNTER — Telehealth: Payer: Self-pay

## 2017-01-15 NOTE — Telephone Encounter (Signed)
-----   Message from Meryl Dare, MD sent at 01/15/2017  1:57 PM EDT ----- Dr. Lavon Paganini read Cheryl Hinton esophageal manometry. She has hyper contractile esophagus. Not sure if narcotics and Lyrica are playing any role but can have some effect on esophageal contractility. It may help if she can be tapered off those meds.

## 2017-01-15 NOTE — Telephone Encounter (Signed)
Patient notified of the results and recommendations. She does not think that she can taper off of either of them or coming off Lyrica or Pain meds will be possible.

## 2017-06-27 ENCOUNTER — Other Ambulatory Visit: Payer: Self-pay | Admitting: Family Medicine

## 2017-06-27 DIAGNOSIS — I779 Disorder of arteries and arterioles, unspecified: Secondary | ICD-10-CM

## 2017-07-03 ENCOUNTER — Ambulatory Visit
Admission: RE | Admit: 2017-07-03 | Discharge: 2017-07-03 | Disposition: A | Discharge status: Home / Self Care | Payer: Medicare Other | Source: Ambulatory Visit | Attending: Family Medicine | Admitting: Family Medicine

## 2017-07-03 DIAGNOSIS — I779 Disorder of arteries and arterioles, unspecified: Secondary | ICD-10-CM

## 2017-10-02 ENCOUNTER — Ambulatory Visit: Payer: Medicare Other | Admitting: Internal Medicine

## 2017-10-02 ENCOUNTER — Encounter: Payer: Self-pay | Admitting: Internal Medicine

## 2017-10-02 VITALS — BP 130/78 | HR 74 | Ht 63.5 in | Wt 167.8 lb

## 2017-10-02 DIAGNOSIS — R05 Cough: Secondary | ICD-10-CM

## 2017-10-02 DIAGNOSIS — R058 Other specified cough: Secondary | ICD-10-CM | POA: Insufficient documentation

## 2017-10-02 DIAGNOSIS — I10 Essential (primary) hypertension: Secondary | ICD-10-CM

## 2017-10-02 MED ORDER — PANTOPRAZOLE SODIUM 40 MG PO TBEC: 40.0000 mg | DELAYED_RELEASE_TABLET | Freq: Every day | ORAL | 2 refills | Status: DC

## 2017-10-02 MED ORDER — FAMOTIDINE 20 MG PO TABS: ORAL_TABLET | ORAL | 11 refills | Status: DC

## 2017-10-02 MED ORDER — TELMISARTAN-HCTZ 80-25 MG PO TABS: 1.0000 | ORAL_TABLET | Freq: Every day | ORAL | 2 refills | Status: DC

## 2017-10-02 MED ORDER — AMOXICILLIN-POT CLAVULANATE 875-125 MG PO TABS: 1.0000 | ORAL_TABLET | Freq: Two times a day (BID) | ORAL | 0 refills | Status: AC

## 2017-10-02 NOTE — Progress Notes (Signed)
Subjective:     Patient ID: Cheryl Hinton, female   DOB: [birth year],    MRN: [id number]  HPI  5 yow medical transcriptionist quit smoking in 1968 with tendency to severe sinus/lower respiratory infections dating back to childhood and extending thru adulthood and up several times a year and in between no chronic resp problems or need for inhalers and since 2000 a little better in terms of frequency but sill severe 'bronchitis" at least once a year and much worse Jan 2019 cough/ throat drainage with yellow mucus worse first thing in am so referred to pulmonary clinic 10/02/2017 by Dr   Catha Gosselin     10/02/2017 1st [facility] office visit/ [facility]   Chief Complaint  Patient presents with  . Pulmonary Consult    Referred by Dr. Catha Gosselin. Pt c/o cough since Jan 2019.  Cough is usually non prod and bothers her at night when she lies down. She is clearing her throat often and voice gets hoarse.    cough is worse at hs despite propping up on sev pillows  and freq awakening since Jan 2019 but actual mucus production is less   first thing in am but intermittently slty purulent / feels like she's choking at times esp noct assoc with subjective wheeze and sob at rest  Wit hoareness and sorethroat and no better with saba/ zmax/ tessalon  Has seen Russella Dar for dysphagia >> dx es dysfunction (see Dg Es 12/25/16) but not currently on gerd rx   Cannot tolerate prednisone   No obvious day to day or daytime variability or assoc  mucus plugs or hemoptysis or cp or chest tightness, subjective wheeze or overt sinus or hb symptoms. No unusual exposure hx or h/o childhood pna/ asthma (though note freq "sinus and bronchitis" hx as child) or knowledge of premature birth.  Also denies any obvious fluctuation of symptoms with weather or environmental changes or other aggravating or alleviating factors except as outlined above   Current Allergies, Complete Past Medical History, Past Surgical History, Family  History, and Social History were reviewed in [employer] record.  ROS  The following are not active complaints unless bolded Hoarseness, sore throat, dysphagia, dental problems, itching, sneezing,  nasal congestion or discharge of excess mucus or purulent secretions, ear ache,   fever, chills, sweats, unintended wt loss or wt gain, classically pleuritic or exertional cp,  orthopnea pnd or leg swelling, presyncope, palpitations, abdominal pain, anorexia, nausea, vomiting, diarrhea  or change in bowel habits or change in bladder habits, change in stools or change in urine, dysuria, hematuria,  rash, arthralgias, visual complaints, headache, numbness, weakness or ataxia or problems with walking or coordination,  change in mood/affect or memory.        Current Meds  Medication Sig  . ALPRAZolam (XANAX) 0.5 MG tablet Take 0.5 mg by mouth at bedtime as needed.   Marland Kitchen atenolol (TENORMIN) 50 MG tablet Take 1 tablet (50 mg total) by mouth daily.  . Calcium Carbonate-Vit D-Min (CALCIUM 1200 PO) Take by mouth daily.  . cyclobenzaprine (FLEXERIL) 10 MG tablet Take 10 mg by mouth daily as needed for muscle spasms.  . Dexlansoprazole (DEXILANT PO) Take 1 tablet by mouth daily as needed. Pt unsure of strength  . fish oil-omega-3 fatty acids 1000 MG capsule Take 1 g by mouth daily.  Marland Kitchen lisinopril-hydrochlorothiazide (PRINZIDE,ZESTORETIC) 20-25 MG per tablet Take 1 tablet by mouth daily.   . Multiple Vitamins-Minerals (EYE VITAMINS PO) Take by mouth  daily.  . oxyCODONE-acetaminophen (PERCOCET) 10-325 MG tablet TAKE 1 TABLET BY MOUTH EVERY FOUR HOURS AS NEEDED FOR PAIN  . potassium chloride (K-DUR,KLOR-CON) 10 MEQ tablet Take 10 mEq by mouth daily.   . pregabalin (LYRICA) 50 MG capsule Take 50 mg by mouth daily.  . Red Yeast Rice Extract (RED YEAST RICE PO) Take by mouth 3 (three) times daily.  . sertraline (ZOLOFT) 100 MG tablet Take 100 mg by mouth daily.      Review of Systems      Objective:   Physical Exam    hoarse amb wf freq throat clearing  Wt Readings from Last 3 Encounters:  10/02/17 167 lb 12.8 oz (76.1 kg)  12/17/16 158 lb (71.7 kg)  12/13/16 158 lb 2 oz (71.7 kg)     Vital signs reviewed - Note on arrival 02 sats  100% on RA       HEENT: nl dentition, turbinates bilaterally, and oropharynx. Nl external ear canals without cough reflex   NECK :  without JVD/Nodes/TM/ nl carotid upstrokes bilaterally   LUNGS: no acc muscle use,  Nl contour chest which is clear to A and P bilaterally without cough on insp or exp maneuvers   CV:  RRR  no s3 or murmur or increase in P2, and no edema   ABD:  soft and nontender with nl inspiratory excursion in the supine position. No bruits or organomegaly appreciated, bowel sounds nl  MS:  Nl gait/ ext warm without deformities, calf tenderness, cyanosis or clubbing No obvious joint restrictions   SKIN: warm and dry without lesions    NEURO:  alert, approp, nl sensorium with  no motor or cerebellar deficits apparent.      Assessment:

## 2017-10-02 NOTE — Patient Instructions (Addendum)
Stop lisinopril and start micardis 80-25 mg one daily - call me if any problem controlling your blood pressure   Augmentin 875 mg take one pill twice daily  X 10 days - take at breakfast and supper with large glass of water.  It would help reduce the usual side effects (diarrhea and yeast infections) if you ate cultured yogurt at lunch.   For cough >  Delsym 2 tsp every 12 hours as needed   Pantoprazole (protonix) 40 mg   Take  30-60 min before first meal of the day and Pepcid (famotidine)  20 mg one @  bedtime until return to office - this is the best way to tell whether stomach acid is contributing to your problem.    GERD (REFLUX)  is an extremely common cause of respiratory symptoms just like yours , many times with no obvious heartburn at all.    It can be treated with medication, but also with lifestyle changes including elevation of the head of your bed (ideally with 6 inch  bed blocks),  Smoking cessation, avoidance of late meals, excessive alcohol, and avoid fatty foods, chocolate, peppermint, colas, red wine, and acidic juices such as orange juice.  NO MINT OR MENTHOL PRODUCTS SO NO COUGH DROPS   USE SUGARLESS CANDY INSTEAD (Jolley ranchers or Stover's or Life Savers) or even ice chips will also do - the key is to swallow to prevent all throat clearing. NO OIL BASED VITAMINS - use powdered substitutes.   If not better, call Cheryl Hinton at [phone] for CT Sinus   Please schedule a follow up office visit in 4 weeks, sooner if needed

## 2017-10-03 ENCOUNTER — Encounter: Payer: Self-pay | Admitting: Internal Medicine

## 2017-10-03 NOTE — Assessment & Plan Note (Addendum)
Trial off acei 10/02/2017 and on max gerd rx and 10 days augmentin    The most common causes of chronic cough in immunocompetent adults include the following: upper airway cough syndrome (UACS), previously referred to as postnasal drip syndrome (PNDS), which is caused by variety of rhinosinus conditions; (2) asthma; (3) GERD; (4) chronic bronchitis from cigarette smoking or other inhaled environmental irritants; (5) nonasthmatic eosinophilic bronchitis; and (6) bronchiectasis.   These conditions, singly or in combination, have accounted for up to 94% of the causes of chronic cough in prospective studies.   Other conditions have constituted no >6% of the causes in prospective studies These have included bronchogenic carcinoma, chronic interstitial pneumonia, sarcoidosis, left ventricular failure, ACEI-induced cough, and aspiration from a condition associated with pharyngeal dysfunction.    Chronic cough is often simultaneously caused by more than one condition. A single cause has been found from 38 to 82% of the time, multiple causes from 18 to 62%. Multiply caused cough has been the result of three diseases up to 42% of the time.       Most likely this is Upper airway cough syndrome (previously labeled PNDS),  is so named because it's frequently impossible to sort out how much is  CR/sinusitis with freq throat clearing (which can be related to primary GERD)   vs  causing  secondary (" extra esophageal")  GERD from wide swings in gastric pressure that occur with throat clearing, often  promoting self use of mint and menthol lozenges that reduce the lower esophageal sphincter tone and exacerbate the problem further in a cyclical fashion.   These are the same pts (now being labeled as having "irritable larynx syndrome" by some cough centers) who not infrequently have a history of having failed to tolerate ace inhibitors,  dry powder inhalers or biphosphonates or report having atypical/extraesophageal  reflux symptoms that don't respond to standard doses of PPI  and are easily confused as having aecopd or asthma flares by even experienced allergists/ pulmonologists (myself included).   rec start with trial off acei/ on max gerd rx and 10 days of augmentin then sinus ct if not improving    Total time devoted to counseling  > 50 % of initial 60 min office visit:  review case with pt/ discussion of options/alternatives/ personally creating written customized instructions  in presence of pt  then going over those specific  Instructions directly with the pt including how to use all of the meds but in particular covering each new medication in detail and the difference between the maintenance= "automatic" meds and the prns using an action plan format for the latter (If this problem/symptom => do that organization reading Left to right).  Please see AVS from this visit for a full list of these instructions which I personally wrote for this pt and  are unique to this visit.

## 2017-10-03 NOTE — Assessment & Plan Note (Signed)
In the best review of chronic cough to date ( NEJM 2016 375 [phone]) ,  ACEi are now felt to cause cough in up to  20% of pts which is a 4 fold increase from previous reports and does not include the variety of non-specific complaints we see in pulmonary clinic in pts on ACEi but previously attributed to another dx like  Copd/asthma and  include PNDS, throat and chest congestion, "bronchitis", unexplained dyspnea and noct "strangling" sensations, and hoarseness, but also  atypical /refractory GERD symptoms like dysphagia and "bad heartburn"   The only way I know  to prove this is not an "ACEi Case" is a trial off ACEi x a minimum of 6 weeks then regroup.   Try micardis 80-25 one daily

## 2017-10-31 ENCOUNTER — Ambulatory Visit: Payer: Medicare Other | Admitting: Internal Medicine

## 2017-10-31 ENCOUNTER — Encounter: Payer: Self-pay | Admitting: Internal Medicine

## 2017-10-31 VITALS — BP 130/78 | HR 65 | Ht 63.5 in | Wt 164.6 lb

## 2017-10-31 DIAGNOSIS — R058 Other specified cough: Secondary | ICD-10-CM

## 2017-10-31 DIAGNOSIS — R05 Cough: Secondary | ICD-10-CM | POA: Diagnosis not present

## 2017-10-31 DIAGNOSIS — I10 Essential (primary) hypertension: Secondary | ICD-10-CM | POA: Diagnosis not present

## 2017-10-31 NOTE — Patient Instructions (Signed)
If you are satisfied with your treatment plan,  let your doctor know and he/she can either refill your medications or you can return here when your prescription runs out.     If in any way you are not 100% satisfied,  please tell us.  If 100% better, tell your friends!  Pulmonary follow up is as needed   

## 2017-10-31 NOTE — Progress Notes (Signed)
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 with tendency to severe sinus/lower respiratory infections dating back to childhood and extending thru adulthood and up several times a year and in between no chronic resp problems or need for inhalers and since 2000 a little better in terms of frequency but sill severe 'bronchitis" at least once a year and much worse Jan 2019 cough/ throat drainage with yellow mucus worse first thing in am so referred to pulmonary clinic 10/02/2017 by Dr   Catha GosselinKevin Little    History of Present Illness  10/02/2017 1st [facility] office visit/ [facility]   Chief Complaint  Patient presents with  . Pulmonary Consult    Referred by Dr. Catha GosselinKevin Little. Pt c/o cough since Jan 2019.  Cough is usually non prod and bothers her at night when she lies down. She is clearing her throat often and voice gets hoarse.    cough is worse at hs despite propping up on sev pillows  and freq awakening since Jan 2019 but actual mucus production is less   first thing in am but intermittently slty purulent / feels like she's choking at times esp noct assoc with subjective wheeze and sob at rest  With hoareness and sorethroat and no better with saba/ zmax/ tessalon Has seen Russella DarStark for dysphagia >> dx es dysfunction (see [facility] 12/25/16) but not currently on gerd rx  Cannot tolerate prednisone rec Stop lisinopril and start micardis 80-25 mg one daily - call me if any problem controlling your blood pressure  Augmentin 875 mg take one pill twice daily  X 10 days - take at breakfast and supper with large glass of water.  It would help reduce the usual side effects (diarrhea and yeast infections) if you ate cultured yogurt at lunch.  For cough >  Delsym 2 tsp every 12 hours as needed  Pantoprazole (protonix) 40 mg   Take  30-60 min before first meal of the day and Pepcid (famotidine)   20 mg one @  bedtime until return to office - this is the best way to tell whether stomach acid is contributing to your problem.   GERD diet        10/31/2017  f/u ov/[facility] re:  uacs improved Chief Complaint  Patient presents with  . Follow-up    Cough has resolved. Her breathing is doing well.   Dyspnea:  Only when [place] to answer the phone/ steps x years  Cough:none Sleep: min noct wheeze whereas prev was waking her up    Still sense food sticking about half way down chest > has f/u planned outside of [place] as not happy with last encounter    No obvious day to day or daytime variability or assoc excess/ purulent sputum or mucus plugs or hemoptysis or cp or chest tightness, subjective wheeze or overt sinus or hb symptoms. No unusual exposure hx or h/o childhood pna/ asthma or knowledge of premature birth.  Sleeping  Better now  without nocturnal  or early am exacerbation  of respiratory  c/o's or need for noct saba. Also denies any obvious fluctuation of symptoms with weather or environmental changes or other aggravating or alleviating factors except as outlined above   Current Allergies, Complete Past Medical History, Past Surgical History, Family History, and Social History were reviewed in [facility] electronic medical  record.  ROS  The following are not active complaints unless bolded Hoarseness, sore throat, dysphagia, dental problems, itching, sneezing,  nasal congestion or discharge of excess mucus or purulent secretions, ear ache,   fever, chills, sweats, unintended wt loss or wt gain, classically pleuritic or exertional cp,  orthopnea pnd or arm/hand swelling  or leg swelling, presyncope, palpitations, abdominal pain, anorexia, nausea, vomiting, diarrhea  or change in bowel habits or change in bladder habits, change in stools or change in urine, dysuria, hematuria,  rash, arthralgias, visual complaints, headache, numbness, weakness or ataxia or problems with walking or  coordination,  change in mood or  memory.        Current Meds  Medication Sig  . ALPRAZolam (XANAX) 0.5 MG tablet Take 0.5 mg by mouth at bedtime as needed.   Marland Kitchen atenolol (TENORMIN) 50 MG tablet Take 1 tablet (50 mg total) by mouth daily.  . Calcium Carbonate-Vit D-Min (CALCIUM 1200 PO) Take by mouth daily.  . cyclobenzaprine (FLEXERIL) 10 MG tablet Take 10 mg by mouth daily as needed for muscle spasms.  . famotidine (PEPCID) 20 MG tablet One at bedtime  . Multiple Vitamins-Minerals (EYE VITAMINS PO) Take by mouth daily.  Marland Kitchen oxyCODONE-acetaminophen (PERCOCET) 10-325 MG tablet TAKE 1 TABLET BY MOUTH EVERY FOUR HOURS AS NEEDED FOR PAIN  . pantoprazole (PROTONIX) 40 MG tablet Take 1 tablet (40 mg total) by mouth daily. Take 30-60 min before first meal of the day  . potassium chloride (K-DUR,KLOR-CON) 10 MEQ tablet Take 10 mEq by mouth daily.   . pregabalin (LYRICA) 50 MG capsule Take 50 mg by mouth daily.  . Red Yeast Rice Extract (RED YEAST RICE PO) Take by mouth 3 (three) times daily.  . sertraline (ZOLOFT) 100 MG tablet Take 100 mg by mouth daily.   Marland Kitchen   telmisartan-hydrochlorothiazide (MICARDIS HCT) 80-25 MG tablet Take 1 tablet by mouth daily.            Objective:   Physical Exam  slt hoarse amb wf nad   10/31/2017          164   10/02/17 167 lb 12.8 oz (76.1 kg)  12/17/16 158 lb (71.7 kg)  12/13/16 158 lb 2 oz (71.7 kg)    . HEENT: nl dentition, turbinates bilaterally, and oropharynx. Nl external ear canals without cough reflex   NECK :  without JVD/Nodes/TM/ nl carotid upstrokes bilaterally   LUNGS: no acc muscle use,  Nl contour chest which is clear to A and P bilaterally without cough on insp or exp maneuvers   CV:  RRR  no s3 or murmur or increase in P2, and no edema   ABD:  soft and nontender with nl inspiratory excursion in the supine position. No bruits or organomegaly appreciated, bowel sounds nl  MS:  Nl gait/ ext warm without deformities, calf tenderness,  cyanosis or clubbing No obvious joint restrictions   SKIN: warm and dry without lesions    NEURO:  alert, approp, nl sensorium with  no motor or cerebellar deficits apparent.           Assessment:

## 2017-11-02 ENCOUNTER — Other Ambulatory Visit: Payer: Self-pay | Admitting: Internal Medicine

## 2017-11-04 ENCOUNTER — Encounter: Payer: Self-pay | Admitting: Internal Medicine

## 2017-11-04 NOTE — Assessment & Plan Note (Signed)
Trial off acei 10/02/2017 and on max gerd rx and 10 days augmentin > resolved as of 10/31/2017   She has f/u for dypshagia already planned and in meantime should continue max rx for gerd and avoid acei indefinitely

## 2017-11-04 NOTE — Assessment & Plan Note (Addendum)
Trial off acei 10/02/2017 for cough> resolved 10/31/2017   Although even in retrospect it may not be clear the ACEi contributed to the pt's symptoms,  Pt improved off them and adding them back at this point or in the future would risk confusion in interpretation of non-specific respiratory symptoms to which this patient is prone  ie  Better not to muddy the waters here.   Adequate control on present rx, reviewed in detail with pt > no change in rx needed ,Follow up per Primary Care planned    Each maintenance medication was reviewed in detail including most importantly the difference between maintenance and as needed and under what circumstances the prns are to be used.  Please see AVS for specific  Instructions which are unique to this visit and I personally typed out  which were reviewed in detail in writing with the patient and a copy provided.

## 2017-11-10 ENCOUNTER — Other Ambulatory Visit: Payer: Self-pay | Admitting: Internal Medicine

## 2018-02-09 ENCOUNTER — Encounter (HOSPITAL_COMMUNITY): Payer: Self-pay | Admitting: Emergency Medicine

## 2018-02-09 ENCOUNTER — Emergency Department (HOSPITAL_COMMUNITY)
Admission: EM | Admit: 2018-02-09 | Discharge: 2018-02-09 | Disposition: A | Discharge status: Home / Self Care | Payer: Medicare Other | Attending: Emergency Medicine | Admitting: Emergency Medicine

## 2018-02-09 DIAGNOSIS — Z5321 Procedure and treatment not carried out due to patient leaving prior to being seen by health care provider: Secondary | ICD-10-CM | POA: Insufficient documentation

## 2018-02-09 DIAGNOSIS — F22 Delusional disorders: Secondary | ICD-10-CM | POA: Insufficient documentation

## 2018-02-09 NOTE — ED Notes (Signed)
No answer from lobby  

## 2018-02-09 NOTE — ED Notes (Signed)
Called with no answer from lobby  °
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
## 2018-02-09 NOTE — ED Notes (Signed)
Bed: [facility] Expected date:  Expected time:  Means of arrival:  Comments: 

## 2019-06-06 ENCOUNTER — Emergency Department (HOSPITAL_COMMUNITY): Payer: Medicare PPO

## 2019-06-06 ENCOUNTER — Other Ambulatory Visit: Payer: Self-pay

## 2019-06-06 ENCOUNTER — Emergency Department (HOSPITAL_COMMUNITY)
Admission: EM | Admit: 2019-06-06 | Discharge: 2019-06-06 | Disposition: A | Discharge status: Home / Self Care | Payer: Medicare PPO | Attending: Emergency Medicine | Admitting: Emergency Medicine

## 2019-06-06 DIAGNOSIS — Z87891 Personal history of nicotine dependence: Secondary | ICD-10-CM | POA: Diagnosis not present

## 2019-06-06 DIAGNOSIS — Z79899 Other long term (current) drug therapy: Secondary | ICD-10-CM | POA: Insufficient documentation

## 2019-06-06 DIAGNOSIS — I10 Essential (primary) hypertension: Secondary | ICD-10-CM

## 2019-06-06 DIAGNOSIS — R42 Dizziness and giddiness: Secondary | ICD-10-CM | POA: Diagnosis present

## 2019-06-06 LAB — CBC
HCT: 37.9 % (ref 36.0–46.0)
Hemoglobin: 12.4 g/dL (ref 12.0–15.0)
MCH: 31 pg (ref 26.0–34.0)
MCHC: 32.7 g/dL (ref 30.0–36.0)
MCV: 94.8 fL (ref 80.0–100.0)
Platelets: 230 10*3/uL (ref 150–400)
RBC: 4 MIL/uL (ref 3.87–5.11)
RDW: 13.3 % (ref 11.5–15.5)
WBC: 5.7 10*3/uL (ref 4.0–10.5)
nRBC: 0 % (ref 0.0–0.2)

## 2019-06-06 LAB — URINALYSIS, ROUTINE W REFLEX MICROSCOPIC
Bilirubin Urine: NEGATIVE
Glucose, UA: NEGATIVE mg/dL
Hgb urine dipstick: NEGATIVE
Ketones, ur: 20 mg/dL — AB
Leukocytes,Ua: NEGATIVE
Nitrite: NEGATIVE
Protein, ur: NEGATIVE mg/dL
Specific Gravity, Urine: 1.008 (ref 1.005–1.030)
pH: 8 (ref 5.0–8.0)

## 2019-06-06 LAB — BASIC METABOLIC PANEL
Anion gap: 7 (ref 5–15)
BUN: 6 mg/dL — ABNORMAL LOW (ref 8–23)
CO2: 35 mmol/L — ABNORMAL HIGH (ref 22–32)
Calcium: 9.2 mg/dL (ref 8.9–10.3)
Chloride: 100 mmol/L (ref 98–111)
Creatinine, Ser: 0.54 mg/dL (ref 0.44–1.00)
GFR calc Af Amer: >60 mL/min (ref >60)
GFR calc non Af Amer: >60 mL/min (ref >60)
Glucose, Bld: 106 mg/dL — ABNORMAL HIGH (ref 70–99)
Potassium: 3.1 mmol/L — ABNORMAL LOW (ref 3.5–5.1)
Sodium: 142 mmol/L (ref 135–145)

## 2019-06-06 LAB — CBG MONITORING, ED: Glucose-Capillary: 88 mg/dL (ref 70–99)

## 2019-06-06 MED ORDER — ATENOLOL 50 MG PO TABS: 50.0000 mg | ORAL_TABLET | Freq: Every day | ORAL | Status: DC

## 2019-06-06 MED ORDER — SERTRALINE HCL 100 MG PO TABS
100.0000 mg | ORAL_TABLET | Freq: Every day | ORAL | Status: DC
  Filled 2019-06-06: qty 1

## 2019-06-06 MED ORDER — AMLODIPINE BESYLATE 5 MG PO TABS: 5.0000 mg | ORAL_TABLET | Freq: Every day | ORAL | 0 refills | Status: DC

## 2019-06-06 MED ORDER — POTASSIUM CHLORIDE CRYS ER 20 MEQ PO TBCR
40.0000 meq | EXTENDED_RELEASE_TABLET | Freq: Once | ORAL | Status: AC
  Administered 2019-06-06: 40 meq via ORAL
  Filled 2019-06-06: qty 2

## 2019-06-06 MED ORDER — OXYCODONE-ACETAMINOPHEN 5-325 MG PO TABS
1.0000 | ORAL_TABLET | Freq: Once | ORAL | Status: AC
  Administered 2019-06-06: 1 via ORAL
  Filled 2019-06-06: qty 1

## 2019-06-06 MED ORDER — PANTOPRAZOLE SODIUM 40 MG PO TBEC: 40.0000 mg | DELAYED_RELEASE_TABLET | Freq: Every day | ORAL | Status: DC

## 2019-06-06 MED ORDER — HYDROCHLOROTHIAZIDE 25 MG PO TABS: 25.0000 mg | ORAL_TABLET | Freq: Every day | ORAL | Status: DC

## 2019-06-06 MED ORDER — IRBESARTAN 300 MG PO TABS: 300.0000 mg | ORAL_TABLET | Freq: Every day | ORAL | Status: DC

## 2019-06-06 MED ORDER — FAMOTIDINE 20 MG PO TABS: 20.0000 mg | ORAL_TABLET | Freq: Every day | ORAL | Status: DC

## 2019-06-06 MED ORDER — PREGABALIN 25 MG PO CAPS: 50.0000 mg | ORAL_CAPSULE | Freq: Every day | ORAL | Status: DC

## 2019-06-06 MED ORDER — TELMISARTAN-HCTZ 80-25 MG PO TABS: 1.0000 | ORAL_TABLET | Freq: Every day | ORAL | Status: DC

## 2019-06-06 MED ORDER — ALPRAZOLAM 0.25 MG PO TABS: 0.5000 mg | ORAL_TABLET | Freq: Every evening | ORAL | Status: DC | PRN

## 2019-06-06 MED ORDER — SODIUM CHLORIDE 0.9% FLUSH
3.0000 mL | Freq: Once | INTRAVENOUS | Status: AC
  Administered 2019-06-06: 19:00:00 3 mL via INTRAVENOUS

## 2019-06-06 NOTE — ED Notes (Signed)
Pt returned from CT °

## 2019-06-06 NOTE — Discharge Instructions (Signed)
Testing today was overall reassuring.  Please follow-up with your primary care provider regarding your blood pressure, as this may require better control. Be sure to stay well-hydrated by drinking plenty of water. Return to the emergency department for any confusion, neurologic deficits, passing out, chest pain, shortness of breath, abdominal pain, or any other other major concerns.
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
## 2019-06-06 NOTE — ED Provider Notes (Signed)
[facility] EMERGENCY DEPARTMENT Provider Note   CSN: [id number] Arrival date & time: 06/06/19  1729     History Chief Complaint  Patient presents with   Headache    Cheryl Hinton is a 84 y.o. female.  HPI     Cheryl Hinton is a 84 y.o. female, with a history of GERD, hyperlipidemia, HTN, presenting to the ED with headache for last 2 to 3 days.  Pain is throbbing, across the forehead, mostly constant.  She states she has had similar headaches in the past when her blood pressure is high.  Headache accompanied by lightheadedness and "wooziness."  Intermittent, bilateral lower extremity edema over the last couple days, improved overnight.  She notes 6 episodes of urination last night, which is more than usual for her. Last night, she noted some fuzziness to her vision bilaterally.  This prompted her to go to her doctor's office today.  While there, her blood pressure was noted to be 220/120.  She was given 0.2 mg clonidine and her symptoms resolved. She has no complaints upon arrival in the ED. She voices compliance with her medications.  Denies fever/chills, N/V/D, chest pain, shortness of breath, abdominal pain, dysuria, hematuria, back/flank pain, syncope, cough, orthopnea, neurologic deficits, or any other complaints.  Past Medical History:  Diagnosis Date   Arthritis    Barrett's esophagus    Colon polyps 1985   Diverticulosis    Endometriosis    Esophageal stricture    GERD (gastroesophageal reflux disease)    Hyperlipidemia    Hypertension    IBS (irritable bowel syndrome)    Status post dilation of esophageal narrowing     Patient Active Problem List   Diagnosis Date Noted   Upper airway cough syndrome 10/02/2017   Shortness of breath 01/13/2012   NAUSEA 05/25/2010   BARRETTS ESOPHAGUS 01/10/2009   CONSTIPATION 01/10/2009   GERD 11/20/2007   DYSPHAGIA 11/20/2007   HYPERLIPIDEMIA 11/19/2007   Essential hypertension  11/19/2007   ESOPHAGEAL STRICTURE 11/19/2007   GASTRITIS 11/19/2007   DIVERTICULOSIS, COLON 11/19/2007   DEGENERATIVE JOINT DISEASE 11/19/2007    Past Surgical History:  Procedure Laterality Date   APPENDECTOMY  1956   BREAST BIOPSY Left    ESOPHAGEAL MANOMETRY N/A 01/13/2017   Procedure: ESOPHAGEAL MANOMETRY (EM);  Surgeon: Napoleon Form, MD;  Location: [facility];  Service: Endoscopy;  Laterality: N/A;   FOOT SURGERY Bilateral    tumor removed   HEMORROIDECTOMY     KNEE SURGERY Bilateral [phone]   x 3   PARTIAL HYSTERECTOMY     TONSILLECTOMY     TOTAL ABDOMINAL HYSTERECTOMY     for endometriosis     OB History   No obstetric history on file.     Family History  Problem Relation Age of Onset   Heart disease Father    Heart failure Sister    Colon cancer Mother    Diabetes Mother     Social History   Tobacco Use   Smoking status: Former Smoker    Packs/day: 0.50    Years: 10.00    Pack years: 5.00    Types: Cigarettes    Quit date: 05/27/1966    Years since quitting: 53.0   Smokeless tobacco: Never Used  Substance Use Topics   Alcohol use: No   Drug use: No    Home Medications Prior to Admission medications   Medication Sig Start Date End Date Taking? Authorizing Provider  ALPRAZolam Prudy Feeler) 0.5 MG  tablet Take 0.5 mg by mouth at bedtime as needed.  11/27/11   [provider]  amLODipine (NORVASC) 5 MG tablet Take 1 tablet (5 mg total) by mouth daily. 06/06/19   Alvira Monday, MD  atenolol (TENORMIN) 50 MG tablet Take 1 tablet (50 mg total) by mouth daily. 04/27/16   Gerhard Munch, MD  Calcium Carbonate-Vit D-Min (CALCIUM 1200 PO) Take by mouth daily.    [provider]  cyclobenzaprine (FLEXERIL) 10 MG tablet Take 10 mg by mouth daily as needed for muscle spasms.    [provider]  famotidine (PEPCID) 20 MG tablet One at bedtime 10/02/17   Nyoka Cowden, MD  Multiple Vitamins-Minerals (EYE  VITAMINS PO) Take by mouth daily.    [provider]  oxyCODONE-acetaminophen (PERCOCET) 10-325 MG tablet TAKE 1 TABLET BY MOUTH EVERY FOUR HOURS AS NEEDED FOR PAIN 09/06/16   [provider]  pantoprazole (PROTONIX) 40 MG tablet TAKE 1 TABLET BY MOUTH ONCE DAILY TAKE  30  TO  60  MINUTES  BEFORE  FIRST  MEAL  OF  THE  DAY 11/10/17   Nyoka Cowden, MD  potassium chloride (K-DUR,KLOR-CON) 10 MEQ tablet Take 10 mEq by mouth daily.  12/03/11   [provider]  pregabalin (LYRICA) 50 MG capsule Take 50 mg by mouth daily.    [provider]  Red Yeast Rice Extract (RED YEAST RICE PO) Take by mouth 3 (three) times daily.    [provider]  sertraline (ZOLOFT) 100 MG tablet Take 100 mg by mouth daily.  12/04/11   [provider]  telmisartan-hydrochlorothiazide (MICARDIS HCT) 80-25 MG tablet TAKE 1 TABLET BY MOUTH ONCE DAILY 11/03/17   Nyoka Cowden, MD    Allergies    Nsaids  Review of Systems   Review of Systems  Constitutional: Negative for chills, diaphoresis and fever.  Respiratory: Negative for cough and shortness of breath.   Cardiovascular: Positive for leg swelling. Negative for chest pain.  Gastrointestinal: Negative for abdominal pain, diarrhea, nausea and vomiting.  Genitourinary: Positive for frequency. Negative for dysuria and hematuria.  Musculoskeletal: Negative for back pain and neck pain.  Neurological: Positive for light-headedness and headaches. Negative for syncope, weakness and numbness.  Psychiatric/Behavioral: Negative for confusion.  All other systems reviewed and are negative.   Physical Exam Updated Vital Signs BP (!) 168/72 (BP Location: Right Arm)    Pulse 67    Temp 98.2 F (36.8 C) (Oral)    Resp 19    SpO2 96%   Physical Exam Vitals and nursing note reviewed.  Constitutional:      General: She is not in acute distress.    Appearance: She is well-developed. She is not diaphoretic.  HENT:     Head:  Normocephalic and atraumatic.     Mouth/Throat:     Mouth: Mucous membranes are moist.     Pharynx: Oropharynx is clear.  Eyes:     Extraocular Movements: Extraocular movements intact.     Conjunctiva/sclera: Conjunctivae normal.     Pupils: Pupils are equal, round, and reactive to light.  Cardiovascular:     Rate and Rhythm: Normal rate and regular rhythm.     Pulses: Normal pulses.          Radial pulses are 2+ on the right side and 2+ on the left side.       Posterior tibial pulses are 2+ on the right side and 2+ on the left side.  Heart sounds: Normal heart sounds.     Comments: Tactile temperature in the extremities appropriate and equal bilaterally. Pulmonary:     Effort: Pulmonary effort is normal. No respiratory distress.     Breath sounds: Normal breath sounds.  Abdominal:     Palpations: Abdomen is soft.     Tenderness: There is no abdominal tenderness. There is no guarding.  Musculoskeletal:     Cervical back: Neck supple.     Right lower leg: No edema.     Left lower leg: No edema.  Lymphadenopathy:     Cervical: No cervical adenopathy.  Skin:    General: Skin is warm and dry.  Neurological:     Mental Status: She is alert and oriented to person, place, and time.     Comments: No noted acute cognitive deficit. Sensation grossly intact to light touch in the extremities.   Grip strengths equal bilaterally.   Strength 5/5 in all extremities.  Coordination intact.  Cranial nerves III-XII grossly intact.  Handles oral secretions without noted difficulty.  No noted phonation or speech deficit. No facial droop.   Psychiatric:        Mood and Affect: Mood and affect normal.        Speech: Speech normal.        Behavior: Behavior normal.     ED Results / Procedures / Treatments   Labs (all labs ordered are listed, but only abnormal results are displayed) Labs Reviewed  BASIC METABOLIC PANEL - Abnormal; Notable for the following components:      Result Value    Potassium 3.1 (*)    CO2 35 (*)    Glucose, Bld 106 (*)    BUN 6 (*)    All other components within normal limits  URINALYSIS, ROUTINE W REFLEX MICROSCOPIC - Abnormal; Notable for the following components:   Ketones, ur 20 (*)    All other components within normal limits  CBC  CBG MONITORING, ED    EKG EKG Interpretation  Date/Time:  Sunday June 06 2019 18:12:05 EST Ventricular Rate:  79 PR Interval:  178 QRS Duration: 80 QT Interval:  418 QTC Calculation: 479 R Axis:   -4 Text Interpretation: Normal sinus rhythm Nonspecific ST abnormality Abnormal ECG No significant change since last tracing Confirmed by Alvira Monday ([id number]) on 06/06/2019 7:25:45 PM   Radiology CT Head Wo Contrast  Result Date: 06/06/2019 CLINICAL DATA:  Dizziness EXAM: CT HEAD WITHOUT CONTRAST TECHNIQUE: Contiguous axial images were obtained from the base of the skull through the vertex without intravenous contrast. COMPARISON:  04/27/2016 FINDINGS: Brain: No acute intracranial abnormality. Specifically, no hemorrhage, hydrocephalus, mass lesion, acute infarction, or significant intracranial injury. Vascular: No hyperdense vessel or unexpected calcification. Skull: No acute calvarial abnormality. Sinuses/Orbits: Visualized paranasal sinuses and mastoids clear. Orbital soft tissues unremarkable. Other: None IMPRESSION: Normal study. Electronically Signed   By: Charlett Nose M.D.   On: 06/06/2019 20:24   DG Chest Portable 1 View  Result Date: 06/06/2019 CLINICAL DATA:  Lower extremity edema EXAM: PORTABLE CHEST 1 VIEW COMPARISON:  08/14/2017 FINDINGS: The heart size and mediastinal contours are within normal limits. No new consolidation or edema. No pleural effusion or pneumothorax. The visualized skeletal structures are unremarkable. IMPRESSION: No acute process in the chest. Electronically Signed   By: Guadlupe Spanish M.D.   On: 06/06/2019 20:58    Procedures Procedures (including critical care  time)  Medications Ordered in ED Medications  oxyCODONE-acetaminophen (PERCOCET/ROXICET) 5-325 MG per tablet 1  tablet (has no administration in time range)  sodium chloride flush (NS) 0.9 % injection 3 mL (3 mLs Intravenous Push 06/06/19 1908)  potassium chloride SA (KLOR-CON) CR tablet 40 mEq (40 mEq Oral Given 06/06/19 2019)    ED Course  I have reviewed the triage vital signs and the nursing notes.  Pertinent labs & imaging results that were available during my care of the patient were reviewed by me and considered in my medical decision making (see chart for details).    MDM Rules/Calculators/A&P                      Patient presents with headache and hypertension.  Symptoms resolved prior to my interaction with the patient and did not recur.  Blood pressure responded favorably to medication given prior to ED arrival. Mild hypokalemia was noted and addressed. No acute abnormalities on imaging studies. Ketonuria may be due to patient's recent poor oral intake. Recommend close PCP follow-up.  Ambulated prior to discharge. The patient was given instructions for home care as well as return precautions. Patient voices understanding of these instructions, accepts the plan, and is comfortable with discharge.  Findings and plan of care discussed with Gareth Morgan, MD. Dr. Billy Fischer personally evaluated and examined this patient.  Vitals:   06/06/19 1732 06/06/19 1935  BP: (!) 159/78 (!) 168/72  Pulse: 74 67  Resp: 15 19  Temp: 98.2 F (36.8 C)   TempSrc: Oral   SpO2: 96% 96%     Final Clinical Impression(s) / ED Diagnoses Final diagnoses:  Hypertension, unspecified type    Rx / DC Orders ED Discharge Orders         Ordered    amLODipine (NORVASC) 5 MG tablet  Daily     06/06/19 2102           Layla Maw 06/06/19 2117    Gareth Morgan, MD 06/09/19 [phone]

## 2019-06-09 ENCOUNTER — Encounter: Payer: Self-pay | Admitting: Cardiovascular Disease

## 2019-06-09 NOTE — Progress Notes (Signed)
Cardiology Office Note:    Date:  06/10/2019   ID:  Cheryl Hinton, DOB [birth year], MRN [id number]  PCP:  Hulan Fess, MD  Cardiologist:   Electrophysiologist:  None   Referring MD: Hulan Fess, MD   Chief Complaint  Patient presents with  . Hypertension  . Hyperlipidemia    History of Present Illness:    Jan. 14, 2021 Cheryl Hinton is a 84 y.o. female with a hx of hypertension and hyperlipidemia.   I saw her years ago for HTN.  She has been on telmesartan  She was recently in the hospital with a headache and was found to have hypertension.  CT scan of the brain was negative . Potassium was low.  She was given amlodipine and extra potassium supplement for that day only  and was told to follow-up with [place].    We are asked to see her today by Dr. Hulan Fess for further evaluation and management of her hypertension.  Has been eating more salt.   Organic popcorn recently .  Does not eat meat for the most part. Grilled chicken on occasion .   I saw her back in August, 2013 for shortness of breath.  I put her on Lisinopril years ago and she did very well.  Echocardiogram at that time was essentially normal.  Ejection fraction was 55 to 60%.  Myoview study was negative for ischemia and showed normal left ventricular systolic function.    Past Medical History:  Diagnosis Date  . Arthritis   . Barrett's esophagus   . Colon polyps 1985  . Diverticulosis   . Endometriosis   . Esophageal stricture   . GERD (gastroesophageal reflux disease)   . Hyperlipidemia   . Hypertension   . IBS (irritable bowel syndrome)   . Status post dilation of esophageal narrowing     Past Surgical History:  Procedure Laterality Date  . APPENDECTOMY  1956  . BREAST BIOPSY Left   . ESOPHAGEAL MANOMETRY N/A 01/13/2017   Procedure: ESOPHAGEAL MANOMETRY (EM);  Surgeon: Mauri Pole, MD;  Location: [facility];  Service: Endoscopy;  Laterality: N/A;  . FOOT SURGERY Bilateral     tumor removed  . HEMORROIDECTOMY    . KNEE SURGERY Bilateral [phone]   x 3  . PARTIAL HYSTERECTOMY    . TONSILLECTOMY    . TOTAL ABDOMINAL HYSTERECTOMY     for endometriosis    Current Medications: Current Meds  Medication Sig  . ALPRAZolam (XANAX) 0.5 MG tablet Take 0.5 mg by mouth at bedtime as needed.   Marland Kitchen amLODipine (NORVASC) 5 MG tablet Take 1 tablet (5 mg total) by mouth daily.  Marland Kitchen atenolol (TENORMIN) 50 MG tablet Take 1 tablet (50 mg total) by mouth daily.  . Calcium Carbonate-Vit D-Min (CALCIUM 1200 PO) Take by mouth daily.  . cyclobenzaprine (FLEXERIL) 10 MG tablet Take 10 mg by mouth daily as needed for muscle spasms.  Marland Kitchen erythromycin ophthalmic ointment Place 1 application into the left eye as needed.  . famotidine (PEPCID) 20 MG tablet One at bedtime  . hydrOXYzine (ATARAX/VISTARIL) 50 MG tablet Take 50 mg by mouth at bedtime.  . Multiple Vitamins-Minerals (EYE VITAMINS PO) Take by mouth daily.  . naloxone (NARCAN) 4 MG/0.1ML LIQD nasal spray kit Place 4 mg into the nose once. As needed  . oxyCODONE-acetaminophen (PERCOCET) 10-325 MG tablet TAKE 1 TABLET BY MOUTH EVERY FOUR HOURS AS NEEDED FOR PAIN  . pantoprazole (PROTONIX) 40 MG tablet TAKE 1 TABLET BY  MOUTH ONCE DAILY TAKE  30  TO  60  MINUTES  BEFORE  FIRST  MEAL  OF  THE  DAY  . potassium chloride (K-DUR,KLOR-CON) 10 MEQ tablet Take 10 mEq by mouth daily.   . pregabalin (LYRICA) 75 MG capsule Take 75 mg by mouth daily.  . Red Yeast Rice Extract (RED YEAST RICE PO) Take by mouth 3 (three) times daily.  . sertraline (ZOLOFT) 50 MG tablet Take 50 mg by mouth daily.  Marland Kitchen telmisartan-hydrochlorothiazide (MICARDIS HCT) 80-25 MG tablet TAKE 1 TABLET BY MOUTH ONCE DAILY  . timolol (TIMOPTIC) 0.5 % ophthalmic solution Place 1 drop into the left eye every morning.  . valACYclovir (VALTREX) 500 MG tablet Take 500 mg by mouth daily.  . [DISCONTINUED] pregabalin (LYRICA) 50 MG capsule Take 50 mg by mouth daily.  .  [DISCONTINUED] sertraline (ZOLOFT) 100 MG tablet Take 100 mg by mouth daily.      Allergies:   Nsaids   Social History   Socioeconomic History  . Marital status: Widowed    Spouse name: Not on file  . Number of children: 1  . Years of education: Not on file  . Highest education level: Not on file  Occupational History  . Occupation: RETIRED    Employer: RETIRED  Tobacco Use  . Smoking status: Former Smoker    Packs/day: 0.50    Years: 10.00    Pack years: 5.00    Types: Cigarettes    Quit date: 05/27/1966    Years since quitting: 53.0  . Smokeless tobacco: Never Used  Substance and Sexual Activity  . Alcohol use: No  . Drug use: No  . Sexual activity: Not on file  Other Topics Concern  . Not on file  Social History Narrative  . Not on file   Social Determinants of Health   Financial Resource Strain:   . Difficulty of Paying Living Expenses: Not on file  Food Insecurity:   . Worried About Charity fundraiser in the Last Year: Not on file  . Ran Out of Food in the Last Year: Not on file  Transportation Needs:   . Lack of Transportation (Medical): Not on file  . Lack of Transportation (Non-Medical): Not on file  Physical Activity:   . Days of Exercise per Week: Not on file  . Minutes of Exercise per Session: Not on file  Stress:   . Feeling of Stress : Not on file  Social Connections:   . Frequency of Communication with Friends and Family: Not on file  . Frequency of Social Gatherings with Friends and Family: Not on file  . Attends Religious Services: Not on file  . Active Member of Clubs or Organizations: Not on file  . Attends Archivist Meetings: Not on file  . Marital Status: Not on file     Family History: The patient's family history includes Colon cancer in her mother; Diabetes in her mother; Heart disease in her father; Heart failure in her sister.  ROS:   Please see the history of present illness.     All other systems reviewed and are  negative.  EKGs/Labs/Other Studies Reviewed:    The following studies were reviewed today:  EKG: June 07, 2019: Normal sinus rhythm at 79 beats a minute.  Nonspecific ST and T wave changes.  Recent Labs: 06/06/2019: BUN 6; Creatinine, Ser 0.54; Hemoglobin 12.4; Platelets 230; Potassium 3.1; Sodium 142  Recent Lipid Panel No results found for: CHOL, TRIG, HDL,  CHOLHDL, VLDL, LDLCALC, LDLDIRECT  Physical Exam:    VS:  BP 124/68   Pulse 68   Ht 5' 3" (1.6 m)   Wt 151 lb 12.8 oz (68.9 kg)   SpO2 95%   BMI 26.89 kg/m     Wt Readings from Last 3 Encounters:  06/10/19 151 lb 12.8 oz (68.9 kg)  10/31/17 164 lb 9.6 oz (74.7 kg)  10/02/17 167 lb 12.8 oz (76.1 kg)     GEN:  Well nourished, well developed in no acute distress HEENT: Normal NECK: No JVD; No carotid bruits LYMPHATICS: No lymphadenopathy CARDIAC: RRR, no murmurs, rubs, gallops RESPIRATORY:  Clear to auscultation without rales, wheezing or rhonchi  ABDOMEN: Soft, non-tender, non-distended MUSCULOSKELETAL:  No edema; No deformity  SKIN: Warm and dry NEUROLOGIC:  Alert and oriented x 3 PSYCHIATRIC:  Normal affect   ASSESSMENT:    No diagnosis found. PLAN:    In order of problems listed above:  1. Essential HTN:   Has been eating more salt.  She has been eating organic popcorn every other night.  She recently presented to the emergency room with swelling and hypertension was found to be markedly hypertensive.  She was also found to be hypokalemic.  She was given a one-time potassium supplement and was prescribed amlodipine.   Medication Adjustments/Labs and Tests Ordered: Current medicines are reviewed at length with the patient today.  Concerns regarding medicines are outlined above.  No orders of the defined types were placed in this encounter.  No orders of the defined types were placed in this encounter.   There are no Patient Instructions on file for this visit.   Signed, Mertie Moores, MD  06/10/2019  3:40 PM    [place]

## 2019-06-10 ENCOUNTER — Encounter: Payer: Self-pay | Admitting: Cardiovascular Disease

## 2019-06-10 ENCOUNTER — Other Ambulatory Visit: Payer: Self-pay

## 2019-06-10 ENCOUNTER — Ambulatory Visit (INDEPENDENT_AMBULATORY_CARE_PROVIDER_SITE_OTHER): Payer: Medicare PPO | Admitting: Cardiovascular Disease

## 2019-06-10 VITALS — BP 124/68 | HR 68 | Ht 63.0 in | Wt 151.8 lb

## 2019-06-10 DIAGNOSIS — I10 Essential (primary) hypertension: Secondary | ICD-10-CM | POA: Diagnosis not present

## 2019-06-10 DIAGNOSIS — R0602 Shortness of breath: Secondary | ICD-10-CM

## 2019-06-10 MED ORDER — POTASSIUM CHLORIDE ER 10 MEQ PO TBCR: 10.0000 meq | EXTENDED_RELEASE_TABLET | Freq: Two times a day (BID) | ORAL | 3 refills | Status: DC

## 2019-06-10 MED ORDER — AMLODIPINE BESYLATE 5 MG PO TABS: 5.0000 mg | ORAL_TABLET | Freq: Every day | ORAL | 3 refills | Status: DC

## 2019-06-10 NOTE — Patient Instructions (Addendum)
Medication Instructions:  Your physician has recommended you make the following change in your medication:  INCREASE KDur (potassium chloride) to 10 mEq twice daily -  You may take these at the same time or at 2 different times each day  *If you need a refill on your cardiac medications before your next appointment, please call your pharmacy*  Lab Work: Your physician recommends that you return for lab work on Tuesday February 2. You do not have to fast for this appointment.  If you have labs (blood work) drawn today and your tests are completely normal, you will receive your results only by: Marland Kitchen MyChart Message (if you have MyChart) OR . A paper copy in the mail If you have any lab test that is abnormal or we need to change your treatment, we will call you to review the results.  Testing/Procedures: None Ordered   Follow-Up: At [facility], you and your health needs are our priority.  As part of our continuing mission to provide you with exceptional heart care, we have created designated Provider Care Teams.  These Care Teams include your primary Cardiologist (physician) and Advanced Practice Providers (APPs -  Physician Assistants and Nurse Practitioners) who all work together to provide you with the care you need, when you need it.  Your next appointment:   2 month(s)  The format for your next appointment:   In Person on March 25 at 3:40 pm  Provider:   You may see Dr. Elease Hashimoto or one of the following Advanced Practice Providers on your designated Care Team:    Tereso Newcomer, PA-C  [place]in [place], [place]  [place], [place]

## 2019-06-29 ENCOUNTER — Other Ambulatory Visit: Payer: Medicare PPO

## 2019-06-29 ENCOUNTER — Other Ambulatory Visit: Payer: Self-pay

## 2019-06-29 ENCOUNTER — Ambulatory Visit: Payer: Medicare PPO | Admitting: Nurse Practitioner

## 2019-06-29 DIAGNOSIS — M7989 Other specified soft tissue disorders: Secondary | ICD-10-CM | POA: Insufficient documentation

## 2019-06-29 DIAGNOSIS — I10 Essential (primary) hypertension: Secondary | ICD-10-CM

## 2019-06-29 DIAGNOSIS — R0602 Shortness of breath: Secondary | ICD-10-CM

## 2019-06-29 NOTE — Progress Notes (Signed)
1.) Reason for visit: patient presents with concerns about bilateral leg swelling onset 2-3 weeks ago after starting amlodipine for high BP; states her BP remains elevated at [facility] where she lives  2.) Name of MD requesting visit: Patient walked in  3.) H&P: Pt seen on 06/10/19 for the first time by Dr. Elease Hashimoto. She had recently been to the ED for HTN and was found to have serum K+ level of 3.1. She was advised to start amlodipine 5 mg by ED provider. She states onset of bilateral leg swelling coincides with starting amlodipine.   4.) ROS related to problem: BP here today is 140/70 mmHg.   5.) Assessment and plan per MD: Per Dr. Elease Hashimoto, d/c amlodipine. We will review bmet results and call tomorrow with advice regarding medication change. If K+ remains low, may consider starting spironolactone. If K+ level is normal on supplement, may consider stopping atenolol and starting Bystolic for better BP control. Patient left the office in NAD and is aware our office will call her tomorrow with advice regarding medications.

## 2019-06-29 NOTE — Patient Instructions (Addendum)
Medication Instructions:  Your physician has recommended you make the following change in your medication:  STOP Amlodipine  *If you need a refill on your cardiac medications before your next appointment, please call your pharmacy*  Lab Work: Done Today - we will call you with results and plan for medications If you have labs (blood work) drawn today and your tests are completely normal, you will receive your results only by: Marland Kitchen MyChart Message (if you have MyChart) OR . A paper copy in the mail If you have any lab test that is abnormal or we need to change your treatment, we will call you to review the results.   Testing/Procedures: None Ordered   Follow-Up: At [facility], you and your health needs are our priority.  As part of our continuing mission to provide you with exceptional heart care, we have created designated Provider Care Teams.  These Care Teams include your primary Cardiologist (physician) and Advanced Practice Providers (APPs -  Physician Assistants and Nurse Practitioners) who all work together to provide you with the care you need, when you need it.  Your next appointment:   2 month(s)  The format for your next appointment:   In Person  Provider:   Kristeen Miss, MD   **Call for sooner appointment if BP/leg swelling continues after medication changes

## 2019-06-30 LAB — BASIC METABOLIC PANEL
BUN/Creatinine Ratio: 14 (ref 12–28)
BUN: 9 mg/dL (ref 8–27)
CO2: 29 mmol/L (ref 20–29)
Calcium: 9.8 mg/dL (ref 8.7–10.3)
Chloride: 99 mmol/L (ref 96–106)
Creatinine, Ser: 0.65 mg/dL (ref 0.57–1.00)
GFR calc Af Amer: 96 mL/min/{1.73_m2} (ref >59)
GFR calc non Af Amer: 83 mL/min/{1.73_m2} (ref >59)
Glucose: 85 mg/dL (ref 65–99)
Potassium: 4.1 mmol/L (ref 3.5–5.2)
Sodium: 142 mmol/L (ref 134–144)

## 2019-06-30 NOTE — Progress Notes (Signed)
Her potassium level is now good. BP remains elevated DC atenolol Start Bystolic 5 mg a day  Follow up with an APP in several weeks for BP management

## 2019-06-30 NOTE — Progress Notes (Signed)
Attempted to contact pt.  Phone rang several times with no answer and no VM.  Will try again later. °

## 2019-07-06 ENCOUNTER — Telehealth: Payer: Self-pay | Admitting: Nurse Practitioner

## 2019-07-06 NOTE — Telephone Encounter (Signed)
Spoke with patient and advised her we have been trying to reach her for a week. I finally reached her by calling [facility]. She apologized and I have corrected the home phone number listed in her chart. Reviewed medication change per Dr. Elease Hashimoto. She states she has stopped amlodipine but leg swelling persists. We discussed having her elevate legs, try compression stockings, and decrease intake of high sodium foods. She states she is trying to walk more and be more conscientious of the foods she is eating. She agrees to call back if leg swelling does not improve in a few weeks. She agrees to d/c atenolol and start bystolic 5 mg daily for better BP control. She has an appointment on 3/25 for follow-up with Dr. Elease Hashimoto and is aware to call back with questions or concerns prior to that date. She thanked me for the appointment.

## 2019-07-06 NOTE — Telephone Encounter (Signed)
-----   Message from Vesta Mixer, MD sent at 06/30/2019 10:10 AM EST ----- Potassium levels are good.  We will DC atenolol and start bystolic 5 mg a day to try to achieve better BP control .

## 2019-07-19 ENCOUNTER — Telehealth: Payer: Self-pay | Admitting: Cardiovascular Disease

## 2019-07-19 MED ORDER — NEBIVOLOL HCL 5 MG PO TABS: 5.0000 mg | ORAL_TABLET | Freq: Every day | ORAL | 11 refills | Status: DC

## 2019-07-19 NOTE — Telephone Encounter (Signed)
Patient states that Dr. Elease Hashimoto prescribed her a medication for her BP. She does not know the name of it but states she has gone to each of her 3 pharmacies and neither one had a prescription for her. Please advise.

## 2019-07-19 NOTE — Telephone Encounter (Signed)
Called patient and apologized for not sending her Rx for bystolic. I advised that I had tried to reach her many times prior to finally making contact and at that time I overlooked sending the Rx. I verified the pharmacy and answered her questions about leg swelling. She is aware of her appointment with Dr. Elease Hashimoto on 3/25. She thanked me for the call.

## 2019-08-19 ENCOUNTER — Other Ambulatory Visit: Payer: Self-pay

## 2019-08-19 ENCOUNTER — Encounter: Payer: Self-pay | Admitting: Cardiovascular Disease

## 2019-08-19 ENCOUNTER — Ambulatory Visit: Payer: Medicare PPO | Admitting: Cardiovascular Disease

## 2019-08-19 VITALS — BP 112/62 | HR 64 | Ht 63.5 in | Wt 153.0 lb

## 2019-08-19 DIAGNOSIS — M7989 Other specified soft tissue disorders: Secondary | ICD-10-CM | POA: Diagnosis not present

## 2019-08-19 DIAGNOSIS — E782 Mixed hyperlipidemia: Secondary | ICD-10-CM | POA: Diagnosis not present

## 2019-08-19 DIAGNOSIS — I10 Essential (primary) hypertension: Secondary | ICD-10-CM | POA: Diagnosis not present

## 2019-08-19 MED ORDER — ROSUVASTATIN CALCIUM 10 MG PO TABS: 10.0000 mg | ORAL_TABLET | Freq: Every day | ORAL | 3 refills | Status: DC

## 2019-08-19 NOTE — Progress Notes (Signed)
Cardiology Office Note:    Date:  08/19/2019   ID:  Cheryl Hinton, DOB 04-30-1936, MRN [id number]  PCP:  Hulan Fess, MD  Cardiologist: Nahser  Electrophysiologist:  None   Referring MD: Hulan Fess, MD   Chief Complaint  Patient presents with  . Hypertension    History of Present Illness:    Jan. 14, 2021 Cheryl Hinton is a 84 y.o. female with a hx of hypertension and hyperlipidemia.   I saw her years ago for HTN.  She has been on telmesartan  She was recently in the hospital with a headache and was found to have hypertension.  CT scan of the brain was negative . Potassium was low.  She was given amlodipine and extra potassium supplement for that day only  and was told to follow-up with [place].    We are asked to see her today by Dr. Hulan Fess for further evaluation and management of her hypertension.  Has been eating more salt.   Organic popcorn recently .  Does not eat meat for the most part. Grilled chicken on occasion .   I saw her back in August, 2013 for shortness of breath.  I put her on Lisinopril years ago and she did very well.  Echocardiogram at that time was essentially normal.  Ejection fraction was 55 to 60%.  Myoview study was negative for ischemia and showed normal left ventricular systolic function.  August 19, 2019: Cheryl Hinton is seen today for a follow-up visit regarding her hypertension and leg edema. Her last echocardiogram in 2013 reveals normal left ventricular systolic function.  She did have left ventricular hypertrophy.  Has greatly reduced her salt intake and her BP and leg edema have resolved.  Was eating lots of organic popcorn and using lots of table salt   Sees "stars" when she turns her head to the side Carotid duplex in 2019 showed mild plaque.   She has had hyperlipidemia .   Has tried multiple statins .   Has tried niacin,lipitor,   Total cholesterol is 248.  The HDL is 49.  The LDL is 181.  She is willing to try rosuvastatin. We  discussed the fact that she has tried multiple statins in the past and did not tolerate them.  She also has tried niacin and could not tolerate that.  With her elevated LDL I would have a very low threshold to refer her to the lipid clinic for consideration for PCSK9 inhibitor if she does not tolerate the rosuvastatin.  Past Medical History:  Diagnosis Date  . Arthritis   . Barrett's esophagus   . Colon polyps 1985  . Diverticulosis   . Endometriosis   . Esophageal stricture   . GERD (gastroesophageal reflux disease)   . Hyperlipidemia   . Hypertension   . IBS (irritable bowel syndrome)   . Status post dilation of esophageal narrowing     Past Surgical History:  Procedure Laterality Date  . APPENDECTOMY  1956  . BREAST BIOPSY Left   . ESOPHAGEAL MANOMETRY N/A 01/13/2017   Procedure: ESOPHAGEAL MANOMETRY (EM);  Surgeon: Mauri Pole, MD;  Location: [facility];  Service: Endoscopy;  Laterality: N/A;  . FOOT SURGERY Bilateral    tumor removed  . HEMORROIDECTOMY    . KNEE SURGERY Bilateral [phone]   x 3  . PARTIAL HYSTERECTOMY    . TONSILLECTOMY    . TOTAL ABDOMINAL HYSTERECTOMY     for endometriosis    Current Medications: Current Meds  Medication Sig  . ALPRAZolam (XANAX) 0.5 MG tablet Take 0.5 mg by mouth at bedtime as needed.   . Calcium Carbonate-Vit D-Min (CALCIUM 1200 PO) Take by mouth daily.  . cyclobenzaprine (FLEXERIL) 10 MG tablet Take 10 mg by mouth daily as needed for muscle spasms.  . hydrOXYzine (ATARAX/VISTARIL) 50 MG tablet Take 50 mg by mouth at bedtime.  . Multiple Vitamins-Minerals (EYE VITAMINS PO) Take by mouth daily.  . naloxone (NARCAN) 4 MG/0.1ML LIQD nasal spray kit Place 4 mg into the nose once. As needed  . nebivolol (BYSTOLIC) 5 MG tablet Take 1 tablet (5 mg total) by mouth daily.  Marland Kitchen oxyCODONE-acetaminophen (PERCOCET) 10-325 MG tablet TAKE 1 TABLET BY MOUTH EVERY FOUR HOURS AS NEEDED FOR PAIN  . pantoprazole (PROTONIX) 40 MG tablet  Take 40 mg by mouth daily.  Marland Kitchen POTASSIUM CHLORIDE PO Take 5 mEq by mouth 2 (two) times daily.  . pregabalin (LYRICA) 75 MG capsule Take 75 mg by mouth daily.  . Red Yeast Rice Extract (RED YEAST RICE PO) Take by mouth 3 (three) times daily.  . sertraline (ZOLOFT) 50 MG tablet Take 50 mg by mouth daily.  Marland Kitchen telmisartan-hydrochlorothiazide (MICARDIS HCT) 80-25 MG tablet TAKE 1 TABLET BY MOUTH ONCE DAILY  . timolol (TIMOPTIC) 0.5 % ophthalmic solution Place 1 drop into the left eye every morning.  . valACYclovir (VALTREX) 500 MG tablet Take 500 mg by mouth daily.  . [DISCONTINUED] potassium chloride (KLOR-CON) 10 MEQ tablet Take 1 tablet (10 mEq total) by mouth 2 (two) times daily.     Allergies:   Nsaids   Social History   Socioeconomic History  . Marital status: Widowed    Spouse name: Not on file  . Number of children: 1  . Years of education: Not on file  . Highest education level: Not on file  Occupational History  . Occupation: RETIRED    Employer: RETIRED  Tobacco Use  . Smoking status: Former Smoker    Packs/day: 0.50    Years: 10.00    Pack years: 5.00    Types: Cigarettes    Quit date: 05/27/1966    Years since quitting: 53.2  . Smokeless tobacco: Never Used  Substance and Sexual Activity  . Alcohol use: No  . Drug use: No  . Sexual activity: Not on file  Other Topics Concern  . Not on file  Social History Narrative  . Not on file   Social Determinants of Health   Financial Resource Strain:   . Difficulty of Paying Living Expenses:   Food Insecurity:   . Worried About Charity fundraiser in the Last Year:   . Arboriculturist in the Last Year:   Transportation Needs:   . Film/video editor (Medical):   Marland Kitchen Lack of Transportation (Non-Medical):   Physical Activity:   . Days of Exercise per Week:   . Minutes of Exercise per Session:   Stress:   . Feeling of Stress :   Social Connections:   . Frequency of Communication with Friends and Family:   . Frequency  of Social Gatherings with Friends and Family:   . Attends Religious Services:   . Active Member of Clubs or Organizations:   . Attends Archivist Meetings:   Marland Kitchen Marital Status:      Family History: The patient's family history includes Colon cancer in her mother; Diabetes in her mother; Heart disease in her father; Heart failure in her sister.  ROS:  Please see the history of present illness.     All other systems reviewed and are negative.  EKGs/Labs/Other Studies Reviewed:    The following studies were reviewed today:  EKG: June 07, 2019: Normal sinus rhythm at 79 beats a minute.  Nonspecific ST and T wave changes.  Recent Labs: 06/06/2019: Hemoglobin 12.4; Platelets 230 06/29/2019: BUN 9; Creatinine, Ser 0.65; Potassium 4.1; Sodium 142  Recent Lipid Panel No results found for: CHOL, TRIG, HDL, CHOLHDL, VLDL, LDLCALC, LDLDIRECT  Physical Exam:    Physical Exam: Blood pressure 112/62, pulse 64, height 5' 3.5" (1.613 m), weight 153 lb (69.4 kg), SpO2 97 %.  GEN:  Well nourished, well developed in no acute distress HEENT: Normal NECK: No JVD; No carotid bruits LYMPHATICS: No lymphadenopathy CARDIAC: RRR , no murmurs, rubs, gallops RESPIRATORY:  Clear to auscultation without rales, wheezing or rhonchi  ABDOMEN: Soft, non-tender, non-distended MUSCULOSKELETAL:   No edema .  SKIN: Warm and dry NEUROLOGIC:  Alert and oriented x 3   ASSESSMENT:    1. Leg swelling   2. Essential hypertension   3. Mixed hyperlipidemia    PLAN:    In order of problems listed above:  1. Essential HTN:   BP is much better since she stopped using so much salt.  She feels much better and her leg legs are no longer edematous.  We will continue with her current medications.  2.  Hyperlipidemia: Her LDL is 181.  She is tried several statins including atorvastatin.  She thinks she might of tried Mevacor or Pravachol.  She does not remember ever trying Crestor.  We will start her on  rosuvastatin 10 mg a day.  We will check fasting lipids, liver enzymes, basic metabolic profile in 3 months.  With her elevated LDL and the evidence of mild carotid plaque, I would have a low threshold to refer her to the lipid clinic for consideration for PCSK9 inhibitor if she does not tolerate the rosuvastatin.  3.   Leg edema :  Has resolved with decreased salt intake .    Medication Adjustments/Labs and Tests Ordered: Current medicines are reviewed at length with the patient today.  Concerns regarding medicines are outlined above.  Orders Placed This Encounter  Procedures  . Lipid Profile  . Basic Metabolic Panel (BMET)  . Hepatic function panel   Meds ordered this encounter  Medications  . rosuvastatin (CRESTOR) 10 MG tablet    Sig: Take 1 tablet (10 mg total) by mouth daily.    Dispense:  90 tablet    Refill:  3    Patient Instructions  Medication Instructions:  Your physician has recommended you make the following change in your medication:  START Rosuvastatin (Crestor) 10 mg once daily  *If you need a refill on your cardiac medications before your next appointment, please call your pharmacy*   Lab Work: Your physician recommends that you return for lab work in: 3 months on June 29. You may come in anytime after 7:30 am. You will need to FAST for this appointment - nothing to eat or drink after midnight the night before except water.   If you have labs (blood work) drawn today and your tests are completely normal, you will receive your results only by: Marland Kitchen MyChart Message (if you have MyChart) OR . A paper copy in the mail If you have any lab test that is abnormal or we need to change your treatment, we will call you to review the results.  Testing/Procedures: None Ordered    Follow-Up: At [facility], you and your health needs are our priority.  As part of our continuing mission to provide you with exceptional heart care, we have created designated Provider  Care Teams.  These Care Teams include your primary Cardiologist (physician) and Advanced Practice Providers (APPs -  Physician Assistants and Nurse Practitioners) who all work together to provide you with the care you need, when you need it.  We recommend signing up for the patient portal called "MyChart".  Sign up information is provided on this After Visit Summary.  MyChart is used to connect with patients for Virtual Visits (Telemedicine).  Patients are able to view lab/test results, encounter notes, upcoming appointments, etc.  Non-urgent messages can be sent to your provider as well.   To learn more about what you can do with MyChart, go to [email or web address].    Your next appointment:   6 month(s)   The format for your next appointment:   Either In Person or Virtual  Provider:   Richardson Dopp, PA-C or Robbie Lis, PA-C       Signed, Mertie Moores, MD  08/19/2019 4:12 PM    [place]

## 2019-08-19 NOTE — Patient Instructions (Addendum)
Medication Instructions:  Your physician has recommended you make the following change in your medication:  START Rosuvastatin (Crestor) 10 mg once daily  *If you need a refill on your cardiac medications before your next appointment, please call your pharmacy*   Lab Work: Your physician recommends that you return for lab work in: 3 months on June 29. You may come in anytime after 7:30 am. You will need to FAST for this appointment - nothing to eat or drink after midnight the night before except water.   If you have labs (blood work) drawn today and your tests are completely normal, you will receive your results only by: Marland Kitchen MyChart Message (if you have MyChart) OR . A paper copy in the mail If you have any lab test that is abnormal or we need to change your treatment, we will call you to review the results.   Testing/Procedures: None Ordered    Follow-Up: At [facility], you and your health needs are our priority.  As part of our continuing mission to provide you with exceptional heart care, we have created designated Provider Care Teams.  These Care Teams include your primary Cardiologist (physician) and Advanced Practice Providers (APPs -  Physician Assistants and Nurse Practitioners) who all work together to provide you with the care you need, when you need it.  We recommend signing up for the patient portal called "MyChart".  Sign up information is provided on this After Visit Summary.  MyChart is used to connect with patients for Virtual Visits (Telemedicine).  Patients are able to view lab/test results, encounter notes, upcoming appointments, etc.  Non-urgent messages can be sent to your provider as well.   To learn more about what you can do with MyChart, go to [email or web address].    Your next appointment:   6 month(s)   The format for your next appointment:   Either In Person or Virtual  Provider:   Tereso Newcomer, PA-C or Chelsea Aus, PA-C

## 2019-09-07 DIAGNOSIS — Z79891 Long term (current) use of opiate analgesic: Secondary | ICD-10-CM | POA: Diagnosis not present

## 2019-09-07 DIAGNOSIS — M47812 Spondylosis without myelopathy or radiculopathy, cervical region: Secondary | ICD-10-CM | POA: Diagnosis not present

## 2019-09-07 DIAGNOSIS — G894 Chronic pain syndrome: Secondary | ICD-10-CM | POA: Diagnosis not present

## 2019-09-07 DIAGNOSIS — M4726 Other spondylosis with radiculopathy, lumbar region: Secondary | ICD-10-CM | POA: Diagnosis not present

## 2019-10-19 DIAGNOSIS — R1310 Dysphagia, unspecified: Secondary | ICD-10-CM | POA: Diagnosis not present

## 2019-10-19 DIAGNOSIS — Z800 Family history of malignant neoplasm of digestive organs: Secondary | ICD-10-CM | POA: Diagnosis not present

## 2019-10-19 DIAGNOSIS — K219 Gastro-esophageal reflux disease without esophagitis: Secondary | ICD-10-CM | POA: Diagnosis not present

## 2019-10-19 DIAGNOSIS — R110 Nausea: Secondary | ICD-10-CM | POA: Diagnosis not present

## 2019-10-20 ENCOUNTER — Other Ambulatory Visit: Payer: Self-pay

## 2019-10-20 MED ORDER — NEBIVOLOL HCL 5 MG PO TABS: 5.0000 mg | ORAL_TABLET | Freq: Every day | ORAL | 2 refills | Status: DC

## 2019-10-20 NOTE — Telephone Encounter (Signed)
Pt's medication was sent to pt's pharmacy as requested. Confirmation received.  °

## 2019-11-02 DIAGNOSIS — Z79891 Long term (current) use of opiate analgesic: Secondary | ICD-10-CM | POA: Diagnosis not present

## 2019-11-02 DIAGNOSIS — G894 Chronic pain syndrome: Secondary | ICD-10-CM | POA: Diagnosis not present

## 2019-11-02 DIAGNOSIS — M4726 Other spondylosis with radiculopathy, lumbar region: Secondary | ICD-10-CM | POA: Diagnosis not present

## 2019-11-02 DIAGNOSIS — M47812 Spondylosis without myelopathy or radiculopathy, cervical region: Secondary | ICD-10-CM | POA: Diagnosis not present

## 2019-11-15 DIAGNOSIS — M47812 Spondylosis without myelopathy or radiculopathy, cervical region: Secondary | ICD-10-CM | POA: Diagnosis not present

## 2019-11-15 DIAGNOSIS — Z79891 Long term (current) use of opiate analgesic: Secondary | ICD-10-CM | POA: Diagnosis not present

## 2019-11-15 DIAGNOSIS — G894 Chronic pain syndrome: Secondary | ICD-10-CM | POA: Diagnosis not present

## 2019-11-15 DIAGNOSIS — M4726 Other spondylosis with radiculopathy, lumbar region: Secondary | ICD-10-CM | POA: Diagnosis not present

## 2019-11-23 ENCOUNTER — Other Ambulatory Visit: Payer: Medicare PPO

## 2019-11-24 ENCOUNTER — Other Ambulatory Visit: Payer: Medicare PPO | Admitting: *Deleted

## 2019-11-24 ENCOUNTER — Other Ambulatory Visit: Payer: Self-pay

## 2019-11-24 DIAGNOSIS — E782 Mixed hyperlipidemia: Secondary | ICD-10-CM

## 2019-11-24 DIAGNOSIS — I10 Essential (primary) hypertension: Secondary | ICD-10-CM | POA: Diagnosis not present

## 2019-11-25 LAB — HEPATIC FUNCTION PANEL
ALT: 15 IU/L (ref 0–32)
AST: 25 IU/L (ref 0–40)
Albumin: 4 g/dL (ref 3.6–4.6)
Alkaline Phosphatase: 93 IU/L (ref 48–121)
Bilirubin Total: 0.4 mg/dL (ref 0.0–1.2)
Bilirubin, Direct: 0.13 mg/dL (ref 0.00–0.40)
Total Protein: 6.7 g/dL (ref 6.0–8.5)

## 2019-11-25 LAB — BASIC METABOLIC PANEL
BUN/Creatinine Ratio: 26 (ref 12–28)
BUN: 16 mg/dL (ref 8–27)
CO2: 31 mmol/L — ABNORMAL HIGH (ref 20–29)
Calcium: 9.1 mg/dL (ref 8.7–10.3)
Chloride: 98 mmol/L (ref 96–106)
Creatinine, Ser: 0.62 mg/dL (ref 0.57–1.00)
GFR calc Af Amer: 97 mL/min/{1.73_m2} (ref >59)
GFR calc non Af Amer: 84 mL/min/{1.73_m2} (ref >59)
Glucose: 85 mg/dL (ref 65–99)
Potassium: 4.1 mmol/L (ref 3.5–5.2)
Sodium: 142 mmol/L (ref 134–144)

## 2019-11-25 LAB — LIPID PANEL
Chol/HDL Ratio: 4.9 ratio — ABNORMAL HIGH (ref 0.0–4.4)
Cholesterol, Total: 236 mg/dL — ABNORMAL HIGH (ref 100–199)
HDL: 48 mg/dL (ref >39)
LDL Chol Calc (NIH): 168 mg/dL — ABNORMAL HIGH (ref 0–99)
Triglycerides: 110 mg/dL (ref 0–149)
VLDL Cholesterol Cal: 20 mg/dL (ref 5–40)

## 2019-12-01 ENCOUNTER — Telehealth: Payer: Self-pay | Admitting: Cardiovascular Disease

## 2019-12-01 DIAGNOSIS — E782 Mixed hyperlipidemia: Secondary | ICD-10-CM

## 2019-12-01 NOTE — Telephone Encounter (Signed)
I spoke with patient and reviewed 6/30 lab results with her.  She stopped Rosuvastatin after 2 weeks due to side effect of muscle aches.  She would like to proceed with lipid clinic referral.  Appointment scheduled in lipid clinic for July 19,2021 at 3:30.

## 2019-12-01 NOTE — Telephone Encounter (Signed)
Patient is calling for her lab results.  

## 2019-12-13 ENCOUNTER — Ambulatory Visit (INDEPENDENT_AMBULATORY_CARE_PROVIDER_SITE_OTHER): Payer: Medicare PPO | Admitting: Pharmacist

## 2019-12-13 ENCOUNTER — Other Ambulatory Visit: Payer: Self-pay

## 2019-12-13 DIAGNOSIS — E782 Mixed hyperlipidemia: Secondary | ICD-10-CM

## 2019-12-13 NOTE — Patient Instructions (Signed)
It was a pleasure to meet you!  I will submit a prior authorization for Repatha. I will call you once its approved.  Call us at [phone] with any questions or concerns

## 2019-12-13 NOTE — Progress Notes (Signed)
Patient ID: Cheryl Hinton                 DOB: 1935-08-27                    MRN: [id number]     HPI: Cheryl Hinton is a 84 y.o. female patient referred to lipid clinic by Dr. Acie Fredrickson. PMH is significant for HTN, HLD, IBS and GERD. She has a history of being unable to tolerate statins.  Patient presents today to lipid clinic to discuss alternative medication therapy. She has tried atorvastatin, rosuvastatin and niacin. Unable to get out of bed with all of them due to muscle aches. She is on red yeast rice currently. Was taking 3 tablets, now up to 4.   Repatha is preferred- tier 2 40$ for 30 days 80 for 90 days  Current Medications: red yeast rice Intolerances: rosuvastatin 56m daily, atorvastatin, niacin (muscle aches with all) Risk Factors: HTN, family history of CAD LDL goal: <100  Diet: avoid fried foods, watches food labels, [facility] so food options is limited, no soda, lots of water  Exercise: swims once a week  Family History: The patient's family history includes Colon cancer in her mother; Diabetes in her mother; Heart disease in her father; Heart failure in her sister.  Social History: former smoker, no alcohol  Labs:11/24/2019: TC 236, TG 110, HDL 48, LDL 168 (red yeast rice)  Past Medical History:  Diagnosis Date  . Arthritis   . Barrett's esophagus   . Colon polyps 1985  . Diverticulosis   . Endometriosis   . Esophageal stricture   . GERD (gastroesophageal reflux disease)   . Hyperlipidemia   . Hypertension   . IBS (irritable bowel syndrome)   . Status post dilation of esophageal narrowing     Current Outpatient Medications on File Prior to Visit  Medication Sig Dispense Refill  . ALPRAZolam (XANAX) 0.5 MG tablet Take 0.5 mg by mouth at bedtime as needed.     . Calcium Carbonate-Vit D-Min (CALCIUM 1200 PO) Take by mouth daily.    . cyclobenzaprine (FLEXERIL) 10 MG tablet Take 10 mg by mouth daily as needed for muscle spasms.    .  hydrOXYzine (ATARAX/VISTARIL) 50 MG tablet Take 50 mg by mouth at bedtime.    . Multiple Vitamins-Minerals (EYE VITAMINS PO) Take by mouth daily.    . naloxone (NARCAN) 4 MG/0.1ML LIQD nasal spray kit Place 4 mg into the nose once. As needed    . nebivolol (BYSTOLIC) 5 MG tablet Take 1 tablet (5 mg total) by mouth daily. 90 tablet 2  . oxyCODONE-acetaminophen (PERCOCET) 10-325 MG tablet TAKE 1 TABLET BY MOUTH EVERY FOUR HOURS AS NEEDED FOR PAIN  0  . pantoprazole (PROTONIX) 40 MG tablet Take 40 mg by mouth daily.    .Marland KitchenPOTASSIUM CHLORIDE PO Take 5 mEq by mouth 2 (two) times daily.    . pregabalin (LYRICA) 75 MG capsule Take 75 mg by mouth daily.    . Red Yeast Rice Extract (RED YEAST RICE PO) Take by mouth 3 (three) times daily.    . rosuvastatin (CRESTOR) 10 MG tablet Take 1 tablet (10 mg total) by mouth daily. 90 tablet 3  . sertraline (ZOLOFT) 50 MG tablet Take 50 mg by mouth daily.    .Marland Kitchentelmisartan-hydrochlorothiazide (MICARDIS HCT) 80-25 MG tablet TAKE 1 TABLET BY MOUTH ONCE DAILY 90 tablet 0  . timolol (TIMOPTIC) 0.5 % ophthalmic solution Place 1  drop into the left eye every morning.    . valACYclovir (VALTREX) 500 MG tablet Take 500 mg by mouth daily.     No current facility-administered medications on file prior to visit.    Allergies  Allergen Reactions  . Nsaids     Assessment/Plan:  1. Hyperlipidemia - LDL is above goal of <100 for primary prevention. Discussed PCSK9i, injection technique, side effects and cost. Patient is agreeable to try Repatha. Will submit prior authorization for Repatha and call patient once approved. Patient is ok with the cost of $80 per 90 days. Advised patient that if that become unaffordable for her, we could look into a [employer] grant for her.   Thank you,  Ramond Dial, Pharm.D, BCPS, CPP [facility]  [address] [place], [place] [address]  Phone: [phone]; Fax: [phone]

## 2019-12-14 ENCOUNTER — Telehealth: Payer: Self-pay | Admitting: Pharmacist

## 2019-12-14 ENCOUNTER — Other Ambulatory Visit: Payer: Self-pay | Admitting: Pharmacist

## 2019-12-14 DIAGNOSIS — E782 Mixed hyperlipidemia: Secondary | ICD-10-CM

## 2019-12-14 MED ORDER — REPATHA SURECLICK 140 MG/ML ~~LOC~~ SOAJ: 1.0000 "pen " | SUBCUTANEOUS | 3 refills | Status: DC

## 2019-12-14 NOTE — Telephone Encounter (Signed)
Prior authorization for Repatha approved through 06/11/2020. Cost is $80/90 days- confirmed with pharmacy. Pharmacy will have to order. Called patient and made her aware. Follow up labs scheduled.

## 2019-12-29 ENCOUNTER — Other Ambulatory Visit: Payer: Self-pay

## 2020-01-13 DIAGNOSIS — G894 Chronic pain syndrome: Secondary | ICD-10-CM | POA: Diagnosis not present

## 2020-01-13 DIAGNOSIS — M4726 Other spondylosis with radiculopathy, lumbar region: Secondary | ICD-10-CM | POA: Diagnosis not present

## 2020-01-13 DIAGNOSIS — M47812 Spondylosis without myelopathy or radiculopathy, cervical region: Secondary | ICD-10-CM | POA: Diagnosis not present

## 2020-01-13 DIAGNOSIS — Z79891 Long term (current) use of opiate analgesic: Secondary | ICD-10-CM | POA: Diagnosis not present

## 2020-02-03 DIAGNOSIS — M79671 Pain in right foot: Secondary | ICD-10-CM | POA: Diagnosis not present

## 2020-02-03 DIAGNOSIS — M25561 Pain in right knee: Secondary | ICD-10-CM | POA: Diagnosis not present

## 2020-02-14 DIAGNOSIS — Z1211 Encounter for screening for malignant neoplasm of colon: Secondary | ICD-10-CM | POA: Diagnosis not present

## 2020-02-14 DIAGNOSIS — Z0000 Encounter for general adult medical examination without abnormal findings: Secondary | ICD-10-CM | POA: Diagnosis not present

## 2020-03-07 ENCOUNTER — Other Ambulatory Visit: Payer: Self-pay

## 2020-03-07 ENCOUNTER — Other Ambulatory Visit: Payer: Medicare PPO | Admitting: *Deleted

## 2020-03-07 DIAGNOSIS — M4726 Other spondylosis with radiculopathy, lumbar region: Secondary | ICD-10-CM | POA: Diagnosis not present

## 2020-03-07 DIAGNOSIS — E782 Mixed hyperlipidemia: Secondary | ICD-10-CM

## 2020-03-07 DIAGNOSIS — G894 Chronic pain syndrome: Secondary | ICD-10-CM | POA: Diagnosis not present

## 2020-03-07 DIAGNOSIS — Z1231 Encounter for screening mammogram for malignant neoplasm of breast: Secondary | ICD-10-CM | POA: Diagnosis not present

## 2020-03-07 DIAGNOSIS — M47812 Spondylosis without myelopathy or radiculopathy, cervical region: Secondary | ICD-10-CM | POA: Diagnosis not present

## 2020-03-07 DIAGNOSIS — Z79891 Long term (current) use of opiate analgesic: Secondary | ICD-10-CM | POA: Diagnosis not present

## 2020-03-07 LAB — HEPATIC FUNCTION PANEL
ALT: 16 IU/L (ref 0–32)
AST: 19 IU/L (ref 0–40)
Albumin: 4.1 g/dL (ref 3.6–4.6)
Alkaline Phosphatase: 96 IU/L (ref 44–121)
Bilirubin Total: 0.3 mg/dL (ref 0.0–1.2)
Bilirubin, Direct: 0.12 mg/dL (ref 0.00–0.40)
Total Protein: 6.5 g/dL (ref 6.0–8.5)

## 2020-03-07 LAB — LIPID PANEL
Chol/HDL Ratio: 2.9 ratio (ref 0.0–4.4)
Cholesterol, Total: 165 mg/dL (ref 100–199)
HDL: 57 mg/dL (ref >39)
LDL Chol Calc (NIH): 91 mg/dL (ref 0–99)
Triglycerides: 92 mg/dL (ref 0–149)
VLDL Cholesterol Cal: 17 mg/dL (ref 5–40)

## 2020-03-09 ENCOUNTER — Telehealth: Payer: Self-pay | Admitting: Pharmacist

## 2020-03-09 DIAGNOSIS — I10 Essential (primary) hypertension: Secondary | ICD-10-CM

## 2020-03-10 MED ORDER — POTASSIUM CHLORIDE ER 10 MEQ PO TBCR: 10.0000 meq | EXTENDED_RELEASE_TABLET | Freq: Two times a day (BID) | ORAL | 3 refills | Status: DC

## 2020-03-10 NOTE — Telephone Encounter (Signed)
Patient returned call, transferred to [facility]

## 2020-03-10 NOTE — Telephone Encounter (Signed)
Reviewed labs with patient. LDL much better. Unfortunately patient has had aching with Repatha. Feels much better off of it. Pt is willing to try Praluent. Will do the PA for praluent. Pt also states that the pharmacy filled KCL 10 MEQ once a day for her (Rx written by Dr. Clarene Duke). Per pt she has been taking 10 MEQ BID as directed by Dr. Elease Hashimoto. She would like a new Rx sent in.  Med list says KCL 5 MEQ and is a historical med. Last Rx sent in was 06/10/19. I will clarify with Dr. Elease Hashimoto what dose she should be on. Last K was 4.1 on 11/24/19.

## 2020-03-10 NOTE — Telephone Encounter (Signed)
Agree with tirial of new PCSK9 inhibitor - as suggested by Malena Peer , RPH  Since she is already taking Kdur 10 mew BID and potassium level is normal, lets continue Kdur 10 meq BID.  Check bmp in 2-3 weeks.

## 2020-03-10 NOTE — Telephone Encounter (Signed)
Rx for 10 MEQ BID sent to pharmacy. I have called it into the prescriber line.  Tried to call pt to set up repeat labs, but phone rang and then clicked off. Will try again later

## 2020-03-13 MED ORDER — PRALUENT 75 MG/ML ~~LOC~~ SOAJ: 1.0000 "pen " | SUBCUTANEOUS | 11 refills | Status: DC

## 2020-03-13 NOTE — Telephone Encounter (Signed)
Will continue to attempt to get in contact with patient. Rx for KCL sent- she needs to be set up with BMP in 2 weeks Praluent Rx sent- she needs repeat lipids set up in 2-3 months

## 2020-03-13 NOTE — Addendum Note (Signed)
Addended by: Malena Peer D on: 03/13/2020 01:38 PM   Modules accepted: Orders

## 2020-03-13 NOTE — Telephone Encounter (Signed)
Patient called back. Set up for lab work in 2 weeks and then lipids in 2 months.

## 2020-03-13 NOTE — Telephone Encounter (Addendum)
PA for Praluent approved through 09/06/20 Keep trying to call pt but home number rings, picks up and then clicks Cell phone goes right to voicemail and no VM set up

## 2020-03-20 DIAGNOSIS — M4726 Other spondylosis with radiculopathy, lumbar region: Secondary | ICD-10-CM | POA: Diagnosis not present

## 2020-03-27 ENCOUNTER — Other Ambulatory Visit: Payer: Self-pay | Admitting: Physician Assistant

## 2020-03-27 ENCOUNTER — Other Ambulatory Visit: Payer: Medicare PPO | Admitting: *Deleted

## 2020-03-27 ENCOUNTER — Other Ambulatory Visit: Payer: Self-pay

## 2020-03-27 DIAGNOSIS — R110 Nausea: Secondary | ICD-10-CM | POA: Diagnosis not present

## 2020-03-27 DIAGNOSIS — I10 Essential (primary) hypertension: Secondary | ICD-10-CM | POA: Diagnosis not present

## 2020-03-27 DIAGNOSIS — R1310 Dysphagia, unspecified: Secondary | ICD-10-CM | POA: Diagnosis not present

## 2020-03-27 DIAGNOSIS — K219 Gastro-esophageal reflux disease without esophagitis: Secondary | ICD-10-CM | POA: Diagnosis not present

## 2020-03-27 DIAGNOSIS — R1319 Other dysphagia: Secondary | ICD-10-CM

## 2020-03-28 LAB — BASIC METABOLIC PANEL
BUN/Creatinine Ratio: 20 (ref 12–28)
BUN: 11 mg/dL (ref 8–27)
CO2: 32 mmol/L — ABNORMAL HIGH (ref 20–29)
Calcium: 9.5 mg/dL (ref 8.7–10.3)
Chloride: 99 mmol/L (ref 96–106)
Creatinine, Ser: 0.55 mg/dL — ABNORMAL LOW (ref 0.57–1.00)
GFR calc Af Amer: 101 mL/min/{1.73_m2} (ref >59)
GFR calc non Af Amer: 88 mL/min/{1.73_m2} (ref >59)
Glucose: 92 mg/dL (ref 65–99)
Potassium: 3.9 mmol/L (ref 3.5–5.2)
Sodium: 142 mmol/L (ref 134–144)

## 2020-03-29 ENCOUNTER — Inpatient Hospital Stay: Admission: RE | Admit: 2020-03-29 | Payer: Medicare PPO | Source: Ambulatory Visit

## 2020-04-04 DIAGNOSIS — Z79891 Long term (current) use of opiate analgesic: Secondary | ICD-10-CM | POA: Diagnosis not present

## 2020-04-04 DIAGNOSIS — G894 Chronic pain syndrome: Secondary | ICD-10-CM | POA: Diagnosis not present

## 2020-04-04 DIAGNOSIS — M47812 Spondylosis without myelopathy or radiculopathy, cervical region: Secondary | ICD-10-CM | POA: Diagnosis not present

## 2020-04-04 DIAGNOSIS — M4726 Other spondylosis with radiculopathy, lumbar region: Secondary | ICD-10-CM | POA: Diagnosis not present

## 2020-04-12 ENCOUNTER — Ambulatory Visit
Admission: RE | Admit: 2020-04-12 | Discharge: 2020-04-12 | Disposition: A | Discharge status: Home / Self Care | Payer: Medicare PPO | Source: Ambulatory Visit | Attending: Physician Assistant | Admitting: Physician Assistant

## 2020-04-12 ENCOUNTER — Telehealth: Payer: Self-pay

## 2020-04-12 DIAGNOSIS — K449 Diaphragmatic hernia without obstruction or gangrene: Secondary | ICD-10-CM | POA: Diagnosis not present

## 2020-04-12 DIAGNOSIS — R1319 Other dysphagia: Secondary | ICD-10-CM

## 2020-04-12 DIAGNOSIS — K224 Dyskinesia of esophagus: Secondary | ICD-10-CM | POA: Diagnosis not present

## 2020-04-12 NOTE — Telephone Encounter (Signed)
Patient states she was returning a call - that she had several missed calls from our office. I was in chart looking for who called - did not see any notes anywhere.

## 2020-04-16 NOTE — Progress Notes (Signed)
Virtual Visit via Telephone Note   This visit type was conducted due to national recommendations for restrictions regarding the COVID-19 Pandemic (e.g. social distancing) in an effort to limit this patient's exposure and mitigate transmission in our community.  Due to her co-morbid illnesses, this patient is at least at moderate risk for complications without adequate follow up.  This format is felt to be most appropriate for this patient at this time.  The patient did not have access to video technology/had technical difficulties with video requiring transitioning to audio format only (telephone).  All issues noted in this document were discussed and addressed.  No physical exam could be performed with this format.  Please refer to the patient's chart for her  consent to telehealth for [facility].    Date:  04/17/2020   ID:  Cheryl Hinton, DOB 1935-05-31, MRN [id number] The patient was identified using 2 identifiers.  Patient Location: Home Provider Location: Home Office  PCP:  Hulan Fess, MD  Cardiologist:  Mertie Moores, MD  Electrophysiologist:  None   Evaluation Performed:  Follow-Up Visit  Chief Complaint:  6 months follow up   History of Present Illness:    Cheryl Hinton is a 84 y.o. female with hx of HTN, HLD and esophageal stricture s/p dilation seen for follow up.   Hx of statin intolerance. Followed by lipid clinic.   Had itching with Repatha.  Currently on Praluent.  Seen today for follow up.  Patient reported severe muscle ache all over her body since started taking Praluent.  Reported "4 times worse than pills".  She is also dealing with dysphagia.  Had a barium swallow study last week.  Planning for esophageal stricture dilatation in the near future.  She is very active at home.  She does swimming twice per week without issue.  No chest pain, shortness of breath, palpitation, orthopnea, PND, syncope or lower extremity edema.   The patient does not have  symptoms concerning for COVID-19 infection (fever, chills, cough, or new shortness of breath).    Past Medical History:  Diagnosis Date  . Arthritis   . Barrett's esophagus   . Colon polyps 1985  . Diverticulosis   . Endometriosis   . Esophageal stricture   . GERD (gastroesophageal reflux disease)   . Hyperlipidemia   . Hypertension   . IBS (irritable bowel syndrome)   . Status post dilation of esophageal narrowing    Past Surgical History:  Procedure Laterality Date  . APPENDECTOMY  1956  . BREAST BIOPSY Left   . ESOPHAGEAL MANOMETRY N/A 01/13/2017   Procedure: ESOPHAGEAL MANOMETRY (EM);  Surgeon: Mauri Pole, MD;  Location: [facility];  Service: Endoscopy;  Laterality: N/A;  . FOOT SURGERY Bilateral    tumor removed  . HEMORROIDECTOMY    . KNEE SURGERY Bilateral [id number]   x 3  . PARTIAL HYSTERECTOMY    . TONSILLECTOMY    . TOTAL ABDOMINAL HYSTERECTOMY     for endometriosis     Current Meds  Medication Sig  . Alirocumab (PRALUENT) 75 MG/ML SOAJ Inject 1 pen into the skin every 14 (fourteen) days.  . ALPRAZolam (XANAX) 0.5 MG tablet Take 0.5 mg by mouth at bedtime as needed.   . Calcium Carbonate-Vit D-Min (CALCIUM 1200 PO) Take by mouth daily.  . cyclobenzaprine (FLEXERIL) 10 MG tablet Take 10 mg by mouth daily as needed for muscle spasms.  . hydrOXYzine (ATARAX/VISTARIL) 50 MG tablet Take 50 mg by mouth at bedtime.  Marland Kitchen  Multiple Vitamins-Minerals (EYE VITAMINS PO) Take by mouth daily.  . naloxone (NARCAN) 4 MG/0.1ML LIQD nasal spray kit Place 4 mg into the nose once. As needed  . nebivolol (BYSTOLIC) 5 MG tablet Take 1 tablet (5 mg total) by mouth daily.  Marland Kitchen oxyCODONE-acetaminophen (PERCOCET) 10-325 MG tablet TAKE 1 TABLET BY MOUTH EVERY FOUR HOURS AS NEEDED FOR PAIN  . pantoprazole (PROTONIX) 40 MG tablet Take 40 mg by mouth daily.  . potassium chloride (KLOR-CON) 10 MEQ tablet Take 1 tablet (10 mEq total) by mouth 2 (two) times daily.  . pregabalin  (LYRICA) 75 MG capsule Take 75 mg by mouth daily.  . Red Yeast Rice Extract (RED YEAST RICE PO) Take by mouth 3 (three) times daily.  . sertraline (ZOLOFT) 50 MG tablet Take 50 mg by mouth daily.  Marland Kitchen telmisartan-hydrochlorothiazide (MICARDIS HCT) 80-25 MG tablet TAKE 1 TABLET BY MOUTH ONCE DAILY  . timolol (TIMOPTIC) 0.5 % ophthalmic solution Place 1 drop into the left eye every morning.  . valACYclovir (VALTREX) 500 MG tablet Take 500 mg by mouth daily.     Allergies:   Nsaids   Social History   Tobacco Use  . Smoking status: Former Smoker    Packs/day: 0.50    Years: 10.00    Pack years: 5.00    Types: Cigarettes    Quit date: 05/27/1966    Years since quitting: 53.9  . Smokeless tobacco: Never Used  Vaping Use  . Vaping Use: Never used  Substance Use Topics  . Alcohol use: No  . Drug use: No     Family Hx: The patient's family history includes Colon cancer in her mother; Diabetes in her mother; Heart disease in her father; Heart failure in her sister.  ROS:   Please see the history of present illness.    All other systems reviewed and are negative.   Prior CV studies:   The following studies were reviewed today:  Echo 12/2011 Study Conclusions   Left ventricle: The cavity size was normal. Wall thickness  was increased in a pattern of mild LVH. Systolic function  was normal. The estimated ejection fraction was in the range  of 55% to 60%. Wall motion was normal; there were no  regional wall motion abnormalities.    Labs/Other Tests and Data Reviewed:    EKG:  No ECG reviewed.  Recent Labs: 06/06/2019: Hemoglobin 12.4; Platelets 230 03/07/2020: ALT 16 03/27/2020: BUN 11; Creatinine, Ser 0.55; Potassium 3.9; Sodium 142   Recent Lipid Panel Lab Results  Component Value Date/Time   CHOL 165 03/07/2020 11:21 AM   TRIG 92 03/07/2020 11:21 AM   HDL 57 03/07/2020 11:21 AM   CHOLHDL 2.9 03/07/2020 11:21 AM   LDLCALC 91 03/07/2020 11:21 AM    Wt Readings from  Last 3 Encounters:  04/17/20 160 lb (72.6 kg)  08/19/19 153 lb (69.4 kg)  06/29/19 149 lb 12 oz (67.9 kg)     Objective:    Vital Signs:  BP (!) 158/89   Ht 5' 3.5" (1.613 m)   Wt 160 lb (72.6 kg)   BMI 27.90 kg/m    VITAL SIGNS:  reviewed GEN:  no acute distress PSYCH:  normal affect  ASSESSMENT & PLAN:    1. HTN Blood pressure elevated.  Likely related to ongoing GI issue.  Advised to keep log.  2. HLD - statin intolerance  - 03/07/2020: Cholesterol, Total 165; HDL 57; LDL Chol Calc (NIH) 91; Triglycerides 92  - Hx of  statin intolerance. Followed by lipid clinic.  Had itching with Repatha.  Currently on Praluent>> but reporting worsening muscle ache all over her body.  Last injection was 10 days ago.  I have advised to stop and will forward message to lipid clinic.  3.  Surgical clearance for possible esophageal dilatation -Patient currently dealing with dysphagia and had barium swallow study.  Prior history of esophageal stricture status post dilatation.  May require another dilatation in the near future.  Patient is getting greater than 4 METS of activity without cardiac symptoms.  She will be clear at acceptable risk.  Shared Decision Making/Informed Consent        COVID-19 Education: The signs and symptoms of COVID-19 were discussed with the patient and how to seek care for testing (follow up with PCP or arrange E-visit).  The importance of social distancing was discussed today.  Time:   Today, I have spent 11  minutes with the patient with telehealth technology discussing the above problems.     Medication Adjustments/Labs and Tests Ordered: Current medicines are reviewed at length with the patient today.  Concerns regarding medicines are outlined above.   Tests Ordered: No orders of the defined types were placed in this encounter.   Medication Changes: No orders of the defined types were placed in this encounter.   Follow Up:  Virtual Visit  in 6  month(s)  Signed, Leanor Kail, [place]  04/17/2020 1:32 PM    [facility]

## 2020-04-17 ENCOUNTER — Telehealth (INDEPENDENT_AMBULATORY_CARE_PROVIDER_SITE_OTHER): Payer: Medicare PPO | Admitting: Physician Assistant

## 2020-04-17 ENCOUNTER — Other Ambulatory Visit: Payer: Self-pay

## 2020-04-17 ENCOUNTER — Telehealth: Payer: Self-pay | Admitting: *Deleted

## 2020-04-17 VITALS — BP 158/89 | Ht 63.5 in | Wt 160.0 lb

## 2020-04-17 DIAGNOSIS — K222 Esophageal obstruction: Secondary | ICD-10-CM | POA: Diagnosis not present

## 2020-04-17 DIAGNOSIS — Z01810 Encounter for preprocedural cardiovascular examination: Secondary | ICD-10-CM | POA: Diagnosis not present

## 2020-04-17 DIAGNOSIS — E782 Mixed hyperlipidemia: Secondary | ICD-10-CM

## 2020-04-17 DIAGNOSIS — I10 Essential (primary) hypertension: Secondary | ICD-10-CM

## 2020-04-17 NOTE — Telephone Encounter (Signed)
  Patient Consent for Virtual Visit         Cheryl Hinton has provided verbal consent on 04/17/2020 for a virtual visit (video or telephone).   CONSENT FOR VIRTUAL VISIT FOR:  Cheryl Hinton  By participating in this virtual visit I agree to the following:  I hereby voluntarily request, consent and authorize [facility] and its employed or contracted physicians, physician assistants, nurse practitioners or other licensed health care professionals (the Practitioner), to provide me with telemedicine health care services (the "Services") as deemed necessary by the treating Practitioner. I acknowledge and consent to receive the Services by the Practitioner via telemedicine. I understand that the telemedicine visit will involve communicating with the Practitioner through live audiovisual communication technology and the disclosure of certain medical information by electronic transmission. I acknowledge that I have been given the opportunity to request an in-person assessment or other available alternative prior to the telemedicine visit and am voluntarily participating in the telemedicine visit.  I understand that I have the right to withhold or withdraw my consent to the use of telemedicine in the course of my care at any time, without affecting my right to future care or treatment, and that the Practitioner or I may terminate the telemedicine visit at any time. I understand that I have the right to inspect all information obtained and/or recorded in the course of the telemedicine visit and may receive copies of available information for a reasonable fee.  I understand that some of the potential risks of receiving the Services via telemedicine include:  Marland Kitchen Delay or interruption in medical evaluation due to technological equipment failure or disruption; . Information transmitted may not be sufficient (e.g. poor resolution of images) to allow for appropriate medical decision making by the  Practitioner; and/or  . In rare instances, security protocols could fail, causing a breach of personal health information.  Furthermore, I acknowledge that it is my responsibility to provide information about my medical history, conditions and care that is complete and accurate to the best of my ability. I acknowledge that Practitioner's advice, recommendations, and/or decision may be based on factors not within their control, such as incomplete or inaccurate data provided by me or distortions of diagnostic images or specimens that may result from electronic transmissions. I understand that the practice of medicine is not an exact science and that Practitioner makes no warranties or guarantees regarding treatment outcomes. I acknowledge that a copy of this consent can be made available to me via my patient portal [facility] MyChart), or I can request a printed copy by calling the office of [facility].    I understand that my insurance will be billed for this visit.   I have read or had this consent read to me. . I understand the contents of this consent, which adequately explains the benefits and risks of the Services being provided via telemedicine.  . I have been provided ample opportunity to ask questions regarding this consent and the Services and have had my questions answered to my satisfaction. . I give my informed consent for the services to be provided through the use of telemedicine in my medical care

## 2020-04-17 NOTE — Patient Instructions (Signed)

## 2020-04-18 ENCOUNTER — Telehealth: Payer: Self-pay | Admitting: Pharmacist

## 2020-04-18 MED ORDER — ROSUVASTATIN CALCIUM 5 MG PO TABS: 5.0000 mg | ORAL_TABLET | ORAL | 3 refills | Status: DC

## 2020-04-18 MED ORDER — EZETIMIBE 10 MG PO TABS: 10.0000 mg | ORAL_TABLET | Freq: Every day | ORAL | 3 refills | Status: DC

## 2020-04-18 NOTE — Telephone Encounter (Signed)
Spoke to patient. States she had muscle aches with Praluent. She has been taking red yeast rice 3 tabs in AM and 3 tabs in the PM, wheat bran and started zetia she had from before. I expressed concern over the red yeast rice and that the quantity can vary. We talked about doing lovastatin instead since that is essentially what red yeast rice is. Patient asked about Crestor. States a friend takes. Advised that she has taken before at 10mg  daily. Could try 5mg  three times a week. Patient would like to try rousvastatin 5mg  three times a week and continue zetia 10mg  daily. Stop red yeast rice. Recheck labs in 2-3 months. Pt to call with any issues.

## 2020-04-26 DIAGNOSIS — M1711 Unilateral primary osteoarthritis, right knee: Secondary | ICD-10-CM | POA: Diagnosis not present

## 2020-05-03 DIAGNOSIS — M5136 Other intervertebral disc degeneration, lumbar region: Secondary | ICD-10-CM | POA: Diagnosis not present

## 2020-05-03 DIAGNOSIS — M1711 Unilateral primary osteoarthritis, right knee: Secondary | ICD-10-CM | POA: Diagnosis not present

## 2020-05-10 DIAGNOSIS — M1711 Unilateral primary osteoarthritis, right knee: Secondary | ICD-10-CM | POA: Diagnosis not present

## 2020-05-15 ENCOUNTER — Other Ambulatory Visit: Payer: Medicare PPO

## 2020-06-01 ENCOUNTER — Other Ambulatory Visit: Payer: Medicare PPO

## 2020-06-01 ENCOUNTER — Other Ambulatory Visit: Payer: Self-pay

## 2020-06-01 DIAGNOSIS — I10 Essential (primary) hypertension: Secondary | ICD-10-CM

## 2020-06-01 DIAGNOSIS — Z1152 Encounter for screening for COVID-19: Secondary | ICD-10-CM | POA: Diagnosis not present

## 2020-06-01 LAB — HEPATIC FUNCTION PANEL
ALT: 13 IU/L (ref 0–32)
AST: 21 IU/L (ref 0–40)
Albumin: 4.3 g/dL (ref 3.6–4.6)
Alkaline Phosphatase: 100 IU/L (ref 44–121)
Bilirubin Total: 0.4 mg/dL (ref 0.0–1.2)
Bilirubin, Direct: 0.11 mg/dL (ref 0.00–0.40)
Total Protein: 7.1 g/dL (ref 6.0–8.5)

## 2020-06-01 LAB — LIPID PANEL
Chol/HDL Ratio: 3.4 ratio (ref 0.0–4.4)
Cholesterol, Total: 203 mg/dL — ABNORMAL HIGH (ref 100–199)
HDL: 59 mg/dL (ref >39)
LDL Chol Calc (NIH): 129 mg/dL — ABNORMAL HIGH (ref 0–99)
Triglycerides: 82 mg/dL (ref 0–149)
VLDL Cholesterol Cal: 15 mg/dL (ref 5–40)

## 2020-08-01 DIAGNOSIS — M4726 Other spondylosis with radiculopathy, lumbar region: Secondary | ICD-10-CM | POA: Diagnosis not present

## 2020-08-01 DIAGNOSIS — G894 Chronic pain syndrome: Secondary | ICD-10-CM | POA: Diagnosis not present

## 2020-08-01 DIAGNOSIS — Z79891 Long term (current) use of opiate analgesic: Secondary | ICD-10-CM | POA: Diagnosis not present

## 2020-08-01 DIAGNOSIS — M47812 Spondylosis without myelopathy or radiculopathy, cervical region: Secondary | ICD-10-CM | POA: Diagnosis not present

## 2020-08-02 DIAGNOSIS — M25561 Pain in right knee: Secondary | ICD-10-CM | POA: Diagnosis not present

## 2020-08-10 ENCOUNTER — Telehealth: Payer: Self-pay | Admitting: *Deleted

## 2020-08-10 NOTE — Telephone Encounter (Signed)
   [facility] Pre-operative Risk Assessment    HEARTCARE STAFF: - Please ensure there is not already an duplicate clearance open for this procedure. - Under Visit Info/Reason for Call, type in Other and utilize the format Clearance MM/DD/YY or Clearance TBD. Do not use dashes or single digits. - If request is for dental extraction, please clarify the # of teeth to be extracted.  Request for surgical clearance:  1. What type of surgery is being performed? RIGHT TOTAL KNEE ARTHROPLASTY   2. When is this surgery scheduled? 09/25/20   3. What type of clearance is required (medical clearance vs. Pharmacy clearance to hold med vs. Both)? MEDICAL  4. Are there any medications that need to be held prior to surgery and how long? ASA    5. Practice name and name of physician performing surgery? [facility]; DR. FRANK ALUISIO   6. What is the office phone number? [phone]   7.   What is the office fax number? [phone]  8.   Anesthesia type (None, local, MAC, general) ? CHOICE   Julaine Hua 08/10/2020, 4:03 PM  _________________________________________________________________   (provider comments below)

## 2020-08-11 ENCOUNTER — Encounter: Payer: Self-pay | Admitting: Cardiovascular Disease

## 2020-08-11 ENCOUNTER — Ambulatory Visit (INDEPENDENT_AMBULATORY_CARE_PROVIDER_SITE_OTHER): Payer: Medicare PPO | Admitting: Cardiovascular Disease

## 2020-08-11 ENCOUNTER — Other Ambulatory Visit: Payer: Self-pay

## 2020-08-11 VITALS — BP 126/68 | HR 62 | Ht 63.5 in | Wt 157.2 lb

## 2020-08-11 DIAGNOSIS — I10 Essential (primary) hypertension: Secondary | ICD-10-CM | POA: Diagnosis not present

## 2020-08-11 NOTE — Patient Instructions (Addendum)
Medication Instructions:  Your physician recommends that you continue on your current medications as directed. Please refer to the Current Medication list given to you today.  *If you need a refill on your cardiac medications before your next appointment, please call your pharmacy*   Lab Work: none If you have labs (blood work) drawn today and your tests are completely normal, you will receive your results only by: Marland Kitchen MyChart Message (if you have MyChart) OR . A paper copy in the mail If you have any lab test that is abnormal or we need to change your treatment, we will call you to review the results.   Testing/Procedures: none   Follow-Up: At [facility], you and your health needs are our priority.  As part of our continuing mission to provide you with exceptional heart care, we have created designated Provider Care Teams.  These Care Teams include your primary Cardiologist (physician) and Advanced Practice Providers (APPs -  Physician Assistants and Nurse Practitioners) who all work together to provide you with the care you need, when you need it.  We recommend signing up for the patient portal called "MyChart".  Sign up information is provided on this After Visit Summary.  MyChart is used to connect with patients for Virtual Visits (Telemedicine).  Patients are able to view lab/test results, encounter notes, upcoming appointments, etc.  Non-urgent messages can be sent to your provider as well.   To learn more about what you can do with MyChart, go to [email or web address].    Your next appointment:  6/22/20200 at 3:40pm 4 month(s)  The format for your next appointment:   In Person  Provider:   You will see one of the following Advanced Practice Providers on your designated Care Team:    Tereso Newcomer, PA-C  [place], [place]

## 2020-08-11 NOTE — Telephone Encounter (Signed)
Mrs. Barnhardt was seen today for follow-up visit.  Her blood pressure is well controlled.  She needs to have a right knee replacement.  She is at low risk for her upcoming knee replacement.  She may hold her aspirin for 5 to 7 days prior to her surgery.  Kristeen Miss, MD  08/11/2020 4:56 PM    [facility] [address] [place],  [address] [place], [place]  [address] Phone: [phone]; Fax: [phone]

## 2020-08-11 NOTE — Progress Notes (Signed)
Cardiology Office Note:    Date:  08/11/2020   ID:  Cheryl Hinton, DOB Oct 01, 1935, MRN [id number]  PCP:  Hulan Fess, MD  Cardiologist: Dymir Neeson  Electrophysiologist:  None   Referring MD: Hulan Fess, MD   Chief Complaint  Patient presents with  . Hypertension    History of Present Illness:    Jan. 14, 2021 Cheryl Hinton is a 85 y.o. female with a hx of hypertension and hyperlipidemia.   I saw her years ago for HTN.  She has been on telmesartan  She was recently in the hospital with a headache and was found to have hypertension.  CT scan of the brain was negative . Potassium was low.  She was given amlodipine and extra potassium supplement for that day only  and was told to follow-up with [place].    We are asked to see her today by Dr. Hulan Fess for further evaluation and management of her hypertension.  Has been eating more salt.   Organic popcorn recently .  Does not eat meat for the most part. Grilled chicken on occasion .   I saw her back in August, 2013 for shortness of breath.  I put her on Lisinopril years ago and she did very well.  Echocardiogram at that time was essentially normal.  Ejection fraction was 55 to 60%.  Myoview study was negative for ischemia and showed normal left ventricular systolic function.  August 19, 2019: Cheryl Hinton is seen today for a follow-up visit regarding her hypertension and leg edema. Her last echocardiogram in 2013 reveals normal left ventricular systolic function.  She did have left ventricular hypertrophy.  Has greatly reduced her salt intake and her BP and leg edema have resolved.  Was eating lots of organic popcorn and using lots of table salt   Sees "stars" when she turns her head to the side Carotid duplex in 2019 showed mild plaque.   She has had hyperlipidemia .   Has tried multiple statins .   Has tried niacin,lipitor,   Total cholesterol is 248.  The HDL is 49.  The LDL is 181.  She is willing to try rosuvastatin. We  discussed the fact that she has tried multiple statins in the past and did not tolerate them.  She also has tried niacin and could not tolerate that.  With her elevated LDL I would have a very low threshold to refer her to the lipid clinic for consideration for PCSK9 inhibitor if she does not tolerate the rosuvastatin.  August 11, 2020: Cheryl Hinton is seen today for follow-up of her hypertension and leg edema.  She has normal left ventricular systolic function.  She does have LVH. Needs to have Right knee replacement  She is at low risk for her upcoming knee replacement .  No CP , no dyspnea   Is only taking Rosuvastatin 10 mg a week - higher doses make her muscle ache  Is on zetia 10 mg a day   Past Medical History:  Diagnosis Date  . Arthritis   . Barrett's esophagus   . Colon polyps 1985  . Diverticulosis   . Endometriosis   . Esophageal stricture   . GERD (gastroesophageal reflux disease)   . Hyperlipidemia   . Hypertension   . IBS (irritable bowel syndrome)   . Status post dilation of esophageal narrowing     Past Surgical History:  Procedure Laterality Date  . APPENDECTOMY  1956  . BREAST BIOPSY Left   . ESOPHAGEAL  MANOMETRY N/A 01/13/2017   Procedure: ESOPHAGEAL MANOMETRY (EM);  Surgeon: Mauri Pole, MD;  Location: [facility];  Service: Endoscopy;  Laterality: N/A;  . FOOT SURGERY Bilateral    tumor removed  . HEMORROIDECTOMY    . KNEE SURGERY Bilateral [phone]   x 3  . PARTIAL HYSTERECTOMY    . TONSILLECTOMY    . TOTAL ABDOMINAL HYSTERECTOMY     for endometriosis    Current Medications: Current Meds  Medication Sig  . ALPRAZolam (XANAX) 0.5 MG tablet Take 0.5 mg by mouth at bedtime as needed.   Marland Kitchen aspirin 81 MG chewable tablet Chew 1 tablet by mouth daily.  . Calcium Carbonate-Vit D-Min (CALCIUM 1200 PO) Take by mouth daily.  . cyclobenzaprine (FLEXERIL) 5 MG tablet Take 1 tablet by mouth as needed.  . ezetimibe (ZETIA) 10 MG tablet Take 1 tablet  (10 mg total) by mouth daily.  . hydrOXYzine (ATARAX/VISTARIL) 50 MG tablet Take 50 mg by mouth at bedtime.  . Multiple Vitamins-Minerals (EYE VITAMINS PO) Take by mouth daily.  . naloxone (NARCAN) 4 MG/0.1ML LIQD nasal spray kit Place 4 mg into the nose once. As needed  . nebivolol (BYSTOLIC) 5 MG tablet Take 1 tablet (5 mg total) by mouth daily.  . Omega-3 Fatty Acids (FISH OIL) 1200 MG CAPS Take 1 capsule by mouth daily.  Marland Kitchen oxyCODONE-acetaminophen (PERCOCET) 10-325 MG tablet TAKE 1 TABLET BY MOUTH EVERY FOUR HOURS AS NEEDED FOR PAIN  . pantoprazole (PROTONIX) 40 MG tablet Take 40 mg by mouth daily.  . potassium chloride (KLOR-CON) 10 MEQ tablet Take 1 tablet (10 mEq total) by mouth 2 (two) times daily.  . pregabalin (LYRICA) 50 MG capsule Take 50 mg by mouth daily.  . Red Yeast Rice 600 MG CAPS Take 2 capsules by mouth daily.  . rosuvastatin (CRESTOR) 10 MG tablet Take 1 tablet by mouth 2 (two) times a week.  . sertraline (ZOLOFT) 50 MG tablet Take 50 mg by mouth daily.  . sucralfate (CARAFATE) 1 GM/10ML suspension Take 10 mLs by mouth as needed.  Marland Kitchen telmisartan-hydrochlorothiazide (MICARDIS HCT) 80-25 MG tablet TAKE 1 TABLET BY MOUTH ONCE DAILY  . valACYclovir (VALTREX) 500 MG tablet Take 500 mg by mouth daily.  . [DISCONTINUED] cyclobenzaprine (FLEXERIL) 10 MG tablet Take 10 mg by mouth daily as needed for muscle spasms.  . [DISCONTINUED] pregabalin (LYRICA) 75 MG capsule Take 75 mg by mouth daily.  . [DISCONTINUED] rosuvastatin (CRESTOR) 5 MG tablet Take 1 tablet (5 mg total) by mouth 3 (three) times a week.  . [DISCONTINUED] timolol (TIMOPTIC) 0.5 % ophthalmic solution Place 1 drop into the left eye every morning.     Allergies:   Nsaids, Praluent [alirocumab], and Repatha [evolocumab]   Social History   Socioeconomic History  . Marital status: Widowed    Spouse name: Not on file  . Number of children: 1  . Years of education: Not on file  . Highest education level: Not on file   Occupational History  . Occupation: RETIRED    Employer: RETIRED  Tobacco Use  . Smoking status: Former Smoker    Packs/day: 0.50    Years: 10.00    Pack years: 5.00    Types: Cigarettes    Quit date: 05/27/1966    Years since quitting: 54.2  . Smokeless tobacco: Never Used  Vaping Use  . Vaping Use: Never used  Substance and Sexual Activity  . Alcohol use: No  . Drug use: No  . Sexual activity:  Not on file  Other Topics Concern  . Not on file  Social History Narrative  . Not on file   Social Determinants of Health   Financial Resource Strain: Not on file  Food Insecurity: Not on file  Transportation Needs: Not on file  Physical Activity: Not on file  Stress: Not on file  Social Connections: Not on file     Family History: The patient's family history includes Colon cancer in her mother; Diabetes in her mother; Heart disease in her father; Heart failure in her sister.  ROS:   Please see the history of present illness.     All other systems reviewed and are negative.  EKGs/Labs/Other Studies Reviewed:    The following studies were reviewed today:  EKG:   Recent Labs: 03/27/2020: BUN 11; Creatinine, Ser 0.55; Potassium 3.9; Sodium 142 06/01/2020: ALT 13  Recent Lipid Panel    Component Value Date/Time   CHOL 203 (H) 06/01/2020 1145   TRIG 82 06/01/2020 1145   HDL 59 06/01/2020 1145   CHOLHDL 3.4 06/01/2020 1145   LDLCALC 129 (H) 06/01/2020 1145    Physical Exam:    Physical Exam: Blood pressure 126/68, pulse 62, height 5' 3.5" (1.613 m), weight 157 lb 3.2 oz (71.3 kg), SpO2 98 %.  GEN: Elderly female, no acute distress HEENT: Normal NECK: No JVD; No carotid bruits LYMPHATICS: No lymphadenopathy CARDIAC: RRR , no murmurs, rubs, gallops RESPIRATORY:  Clear to auscultation without rales, wheezing or rhonchi  ABDOMEN: Soft, non-tender, non-distended MUSCULOSKELETAL:  No edema; No deformity  SKIN: Warm and dry NEUROLOGIC:  Alert and oriented x  3    ASSESSMENT:    1. Essential hypertension    PLAN:       1. Essential HTN:   Blood pressure looks good.  She needs to have knee surgery/replacement.  She is at low risk for her upcoming knee replacement. She may hold her aspirin for 5 to 7 days prior to her surgery.  2.  Hyperlipidemia: She tolerates only a very low dose of rosuvastatin.  She takes 10 mg daily.  She is also on ezetimibe.  For now her lipids look okay.  We may need to consider starting her on a PCSK9 inhibitor if her LDL goes any higher.  3.   Leg edema :      Medication Adjustments/Labs and Tests Ordered: Current medicines are reviewed at length with the patient today.  Concerns regarding medicines are outlined above.  Orders Placed This Encounter  Procedures  . EKG 12-Lead   No orders of the defined types were placed in this encounter.   Patient Instructions  Medication Instructions:  Your physician recommends that you continue on your current medications as directed. Please refer to the Current Medication list given to you today.  *If you need a refill on your cardiac medications before your next appointment, please call your pharmacy*   Lab Work: none If you have labs (blood work) drawn today and your tests are completely normal, you will receive your results only by: Marland Kitchen MyChart Message (if you have MyChart) OR . A paper copy in the mail If you have any lab test that is abnormal or we need to change your treatment, we will call you to review the results.   Testing/Procedures: none   Follow-Up: At [facility], you and your health needs are our priority.  As part of our continuing mission to provide you with exceptional heart care, we have created designated Provider Care  Teams.  These Care Teams include your primary Cardiologist (physician) and Advanced Practice Providers (APPs -  Physician Assistants and Nurse Practitioners) who all work together to provide you with the care you need, when  you need it.  We recommend signing up for the patient portal called "MyChart".  Sign up information is provided on this After Visit Summary.  MyChart is used to connect with patients for Virtual Visits (Telemedicine).  Patients are able to view lab/test results, encounter notes, upcoming appointments, etc.  Non-urgent messages can be sent to your provider as well.   To learn more about what you can do with MyChart, go to [email or web address].    Your next appointment:  6/22/20200 at 3:40pm 4 month(s)  The format for your next appointment:   In Person  Provider:   You will see one of the following Advanced Practice Providers on your designated Care Team:    Richardson Dopp, PA-C  Robbie Lis, [place]      Signed, Mertie Moores, MD  08/11/2020 5:52 PM    [place]

## 2020-08-11 NOTE — Telephone Encounter (Signed)
   Primary Cardiologist: Kristeen Miss, MD  Chart reviewed as part of pre-operative protocol coverage.  Ms. Cheryl Hinton is scheduled to see Dr. Elease Hashimoto this afternoon at 3:40pm. Would be reasonable to address preop status at that time.   Will route to Dr. Elease Hashimoto as [name] and remove from the preop pool.  Beatriz Stallion, PA-C 08/11/2020, 12:51 PM

## 2020-08-21 ENCOUNTER — Other Ambulatory Visit: Payer: Self-pay

## 2020-08-21 ENCOUNTER — Encounter: Payer: Self-pay | Admitting: Allergy

## 2020-08-21 ENCOUNTER — Ambulatory Visit: Payer: Medicare PPO | Admitting: Allergy

## 2020-08-21 VITALS — BP 118/66 | HR 61 | Temp 97.9°F | Resp 12 | Ht 63.5 in | Wt 157.0 lb

## 2020-08-21 DIAGNOSIS — L239 Allergic contact dermatitis, unspecified cause: Secondary | ICD-10-CM

## 2020-08-21 NOTE — Assessment & Plan Note (Signed)
Localized irritation, rash and itching after nickel or silver jewelry contact. Planning on right knee replacement in May 2022. No issues with white gold. No prior joint replacements. . Metal Patches placed today. . Please avoid strenuous physical activities and do not get the patches on the back wet. No showering until final patch reading done. Molli Knock to take antihistamines for itching but avoid placing any creams on the back where the patches are. . We will remove the patches on Wednesday and will do our initial read. . Then you will come back on Friday for a final read.

## 2020-08-21 NOTE — Patient Instructions (Addendum)
Patches placed today. Please avoid strenuous physical activities and do not get the patches on the back wet. No showering until final patch reading done. Okay to take antihistamines for itching but avoid placing any creams on the back where the patches are. We will remove the patches on Wednesday and will do our initial read. Then you will come back on Friday for a final read. 

## 2020-08-21 NOTE — Progress Notes (Signed)
New Patient Note  RE: Cheryl Hinton MRN: [id number] DOB: 03-04-1936 Date of Office Visit: 08/21/2020  Referring provider: Gaynelle Arabian, MD Primary care provider: Hulan Fess, MD  Chief Complaint: Allergy Testing (Patient states that smells bother her specifically smoke ansd cleaning supplies.)  History of Present Illness: I had the pleasure of seeing Cheryl Hinton for initial evaluation at the [facility] [place] on 08/21/2020. She is a 85 y.o. female, who is referred here by Dr. Wynelle Link for the evaluation of nickel allergy.  Patient is scheduled for right knee replacement in May 2022.  Patient has issues with nickel/silver earrings which causes some irritation of her earlobes. Last episode was about 2-3 years ago. Bracelets/necklaces/rings with nickel/silver causes contact irritation with itching after she wears it for a long time.  Patient tolerates 14K white gold jewelry with no issues.   No other history of joint replacements. No oral prednisone or steroid injections for the last 2 weeks.  Assessment and Plan: Lan is a 85 y.o. female with: Allergic contact dermatitis Localized irritation, rash and itching after nickel or silver jewelry contact. Planning on right knee replacement in May 2022. No issues with white gold. No prior joint replacements. . Metal Patches placed today. . Please avoid strenuous physical activities and do not get the patches on the back wet. No showering until final patch reading done. [name] to take antihistamines for itching but avoid placing any creams on the back where the patches are. . We will remove the patches on Wednesday and will do our initial read. . Then you will come back on Friday for a final read.  Return in about 2 days (around 08/23/2020) for Patch reading.  Other allergy screening: Asthma: no  In the past but no inhaler use currently. Rhino conjunctivitis: no  Strong scents and smoke irritates her  sinuses. Food allergy: no Medication allergy: yes Hymenoptera allergy: no Urticaria: no Eczema:no History of recurrent infections suggestive of immunodeficency: no  Diagnostics:   Metals Patch - 08/21/20 1039    Time Antigen Placed 1039    Manufacturer Other   [employer]   Location Back    Number of Test 11    Lot # [id number]    Reading Interval Day 1;Day 3;Day 5           Past Medical History: Patient Active Problem List   Diagnosis Date Noted  . Allergic contact dermatitis 08/21/2020  . Leg swelling 06/29/2019  . Upper airway cough syndrome 10/02/2017  . Shortness of breath 01/13/2012  . NAUSEA 05/25/2010  . BARRETTS ESOPHAGUS 01/10/2009  . CONSTIPATION 01/10/2009  . GERD 11/20/2007  . DYSPHAGIA 11/20/2007  . Hyperlipidemia 11/19/2007  . Essential hypertension 11/19/2007  . ESOPHAGEAL STRICTURE 11/19/2007  . GASTRITIS 11/19/2007  . DIVERTICULOSIS, COLON 11/19/2007  . DEGENERATIVE JOINT DISEASE 11/19/2007   Past Medical History:  Diagnosis Date  . Arthritis   . Barrett's esophagus   . Colon polyps 1985  . Diverticulosis   . Endometriosis   . Esophageal stricture   . GERD (gastroesophageal reflux disease)   . Hyperlipidemia   . Hypertension   . IBS (irritable bowel syndrome)   . Status post dilation of esophageal narrowing    Past Surgical History: Past Surgical History:  Procedure Laterality Date  . ADENOIDECTOMY    . APPENDECTOMY  1956  . BREAST BIOPSY Left   . ESOPHAGEAL MANOMETRY N/A 01/13/2017   Procedure: ESOPHAGEAL MANOMETRY (EM);  Surgeon: Mauri Pole, MD;  Location: [facility];  Service: Endoscopy;  Laterality: N/A;  . FOOT SURGERY Bilateral    tumor removed  . HEMORROIDECTOMY    . KNEE SURGERY Bilateral [phone]   x 3  . PARTIAL HYSTERECTOMY    . TONSILLECTOMY    . TONSILLECTOMY    . TOTAL ABDOMINAL HYSTERECTOMY     for endometriosis   Medication List:  Current Outpatient Medications  Medication Sig Dispense  Refill  . ALPRAZolam (XANAX) 0.5 MG tablet Take 0.5 mg by mouth at bedtime as needed.     Marland Kitchen aspirin 81 MG chewable tablet Chew 1 tablet by mouth daily.    . Calcium Carbonate-Vit D-Min (CALCIUM 1200 PO) Take by mouth daily.    . cyclobenzaprine (FLEXERIL) 5 MG tablet Take 1 tablet by mouth as needed.    . ezetimibe (ZETIA) 10 MG tablet Take 1 tablet (10 mg total) by mouth daily. 90 tablet 3  . hydrOXYzine (ATARAX/VISTARIL) 50 MG tablet Take 50 mg by mouth at bedtime.    . Multiple Vitamins-Minerals (EYE VITAMINS PO) Take by mouth daily.    . nebivolol (BYSTOLIC) 5 MG tablet Take 1 tablet (5 mg total) by mouth daily. 90 tablet 2  . Omega-3 Fatty Acids (FISH OIL) 1200 MG CAPS Take 1 capsule by mouth daily.    Marland Kitchen oxyCODONE-acetaminophen (PERCOCET) 10-325 MG tablet TAKE 1 TABLET BY MOUTH EVERY FOUR HOURS AS NEEDED FOR PAIN  0  . pantoprazole (PROTONIX) 40 MG tablet Take 40 mg by mouth daily.    . potassium chloride (KLOR-CON) 10 MEQ tablet Take 1 tablet (10 mEq total) by mouth 2 (two) times daily. 180 tablet 3  . pregabalin (LYRICA) 50 MG capsule Take 50 mg by mouth daily.    . Red Yeast Rice 600 MG CAPS Take 2 capsules by mouth daily.    . rosuvastatin (CRESTOR) 10 MG tablet Take 1 tablet by mouth 2 (two) times a week.    . sertraline (ZOLOFT) 50 MG tablet Take 50 mg by mouth daily.    . sucralfate (CARAFATE) 1 GM/10ML suspension Take 10 mLs by mouth as needed.    Marland Kitchen telmisartan-hydrochlorothiazide (MICARDIS HCT) 80-25 MG tablet TAKE 1 TABLET BY MOUTH ONCE DAILY 90 tablet 0  . valACYclovir (VALTREX) 500 MG tablet Take 500 mg by mouth daily.    . naloxone (NARCAN) 4 MG/0.1ML LIQD nasal spray kit Place 4 mg into the nose once. As needed (Patient not taking: Reported on 08/21/2020)     No current facility-administered medications for this visit.   Allergies: Allergies  Allergen Reactions  . Duloxetine Hcl     Other reaction(s): blurry vision  . Influenza Vaccines     Other reaction(s): "side  effects"  . Lisinopril     Other reaction(s): cough  . Nsaids     Other reaction(s): gastritis  . Other     Other reaction(s): GI upset  . Praluent [Alirocumab]     Muscle pains  . Prednisone     Other reaction(s): Eye Swelling  . Repatha [Evolocumab]     itching  . Statins     Other reaction(s): myalgias   Social History: Social History   Socioeconomic History  . Marital status: Widowed    Spouse name: Not on file  . Number of children: 1  . Years of education: Not on file  . Highest education level: Not on file  Occupational History  . Occupation: RETIRED    Employer: RETIRED  Tobacco Use  . Smoking status:  Former Smoker    Packs/day: 0.50    Years: 10.00    Pack years: 5.00    Types: Cigarettes    Quit date: 05/27/1966    Years since quitting: 54.2  . Smokeless tobacco: Never Used  Vaping Use  . Vaping Use: Never used  Substance and Sexual Activity  . Alcohol use: No  . Drug use: No  . Sexual activity: Not Currently  Other Topics Concern  . Not on file  Social History Narrative  . Not on file   Social Determinants of Health   Financial Resource Strain: Not on file  Food Insecurity: Not on file  Transportation Needs: Not on file  Physical Activity: Not on file  Stress: Not on file  Social Connections: Not on file   Lives in a 83 year old apartment. Smoking: denies Occupation: retired  Programme researcher, broadcasting/film/video HistoryFreight forwarder in the house: no Charity fundraiser in the family room: yes Carpet in the bedroom: yes Heating: electric Cooling: central Pet: no  Family History: Family History  Problem Relation Age of Onset  . Heart disease Father   . Heart failure Sister   . Allergic rhinitis Sister   . Allergic rhinitis Brother   . Colon cancer Mother   . Diabetes Mother   . Allergic rhinitis Mother    Review of Systems  Constitutional: Negative for appetite change, chills, fever and unexpected weight change.  HENT: Negative for congestion and  rhinorrhea.   Eyes: Negative for itching.  Respiratory: Negative for cough, chest tightness, shortness of breath and wheezing.   Cardiovascular: Negative for chest pain.  Gastrointestinal: Negative for abdominal pain.  Genitourinary: Negative for difficulty urinating.  Musculoskeletal: Positive for arthralgias.  Skin: Negative for rash.  Neurological: Negative for headaches.   Objective: BP 118/66   Pulse 61   Temp 97.9 F (36.6 C)   Resp 12   Ht 5' 3.5" (1.613 m)   Wt 157 lb (71.2 kg)   SpO2 96%   BMI 27.38 kg/m  Body mass index is 27.38 kg/m. Physical Exam Vitals and nursing note reviewed.  Constitutional:      Appearance: Normal appearance. She is well-developed.  HENT:     Head: Normocephalic and atraumatic.     Right Ear: External ear normal. There is impacted cerumen.     Left Ear: External ear normal. There is impacted cerumen.     Nose: Nose normal.     Mouth/Throat:     Mouth: Mucous membranes are moist.     Pharynx: Oropharynx is clear.     Comments: Lower lip - mid area has some bruising Eyes:     Conjunctiva/sclera: Conjunctivae normal.  Cardiovascular:     Rate and Rhythm: Normal rate and regular rhythm.     Heart sounds: Normal heart sounds. No murmur heard. No friction rub. No gallop.   Pulmonary:     Effort: Pulmonary effort is normal.     Breath sounds: Normal breath sounds. No wheezing, rhonchi or rales.  Musculoskeletal:     Cervical back: Neck supple.  Skin:    General: Skin is warm.     Findings: No rash.  Neurological:     Mental Status: She is alert and oriented to person, place, and time.  Psychiatric:        Behavior: Behavior normal.    The plan was reviewed with the patient/family, and all questions/concerned were addressed.  It was my pleasure to see Kalia today and participate in her care. Please  feel free to contact me with any questions or concerns.  Sincerely,  Rexene Alberts, DO Allergy & Immunology  [facility] office: [place] office: [phone]

## 2020-08-23 ENCOUNTER — Ambulatory Visit: Payer: Medicare PPO | Admitting: Family Medicine

## 2020-08-23 ENCOUNTER — Other Ambulatory Visit: Payer: Self-pay

## 2020-08-23 ENCOUNTER — Encounter: Payer: Self-pay | Admitting: Family Medicine

## 2020-08-23 VITALS — Temp 97.5°F

## 2020-08-23 DIAGNOSIS — L230 Allergic contact dermatitis due to metals: Secondary | ICD-10-CM

## 2020-08-23 NOTE — Patient Instructions (Signed)
Dermatitis due to metals Your metals patch testing was negative today Follow up in 2 days for the final patch reading After patch testing is complete, make an appointment for environmental allergy testing if interested.  Call the clinic if this treatment plan is not working well for you  Follow up in 2 days or sooner if needed.

## 2020-08-23 NOTE — Progress Notes (Signed)
    Follow-up Note  RE: Cheryl Hinton MRN: [id number] DOB: 1936-02-26 Date of Office Visit: 08/23/2020  Primary care provider: Catha Gosselin, MD Referring provider: Catha Gosselin, MD   Cheryl Hinton returns to the office today for the initial patch test interpretation, given suspected history of contact dermatitis.    Diagnostics:    Metals Patch     Time Antigen Placed  08/21/2020    Location  Back    Number of Test  11    Reading Interval  Day    Chromium chloride 1%  0     Cobalt chloride hexahydrate 1%  0     Molybdenum chloride 0.5%  0     Nickel sulfate hexahydrate 5%  0     Potassium dichromate 0.25%  0     Copper sulfate pentahydrate 2%  0     Titanium 0.1%  0     Manganese chloride 0.5%  0     Tantal 1%  0    Vanadium pentoxide 10%  0   Aluminum hydroxide 10%  0    Nickel sulfate hexahydrate 5%  0     Comments  N/A   Plan:   Allergic contact dermatitis due to metals Your metals patch testing was negative today Follow up in 2 days for the final patch reading After patch testing is complete, make an appointment for environmental allergy testing if interested.  Thank you for the opportunity to care for this patient.  Please do not hesitate to contact me with questions.  Thermon Leyland, FNP [facility]

## 2020-08-25 ENCOUNTER — Other Ambulatory Visit: Payer: Self-pay

## 2020-08-25 ENCOUNTER — Encounter: Payer: Self-pay | Admitting: Family Medicine

## 2020-08-25 ENCOUNTER — Ambulatory Visit (INDEPENDENT_AMBULATORY_CARE_PROVIDER_SITE_OTHER): Payer: Medicare PPO | Admitting: Family Medicine

## 2020-08-25 DIAGNOSIS — L230 Allergic contact dermatitis due to metals: Secondary | ICD-10-CM | POA: Diagnosis not present

## 2020-08-25 NOTE — Patient Instructions (Addendum)
Dermatitis due to metals Your final metals patch testing reading was negative today Make an appointment for environmental allergy testing if interested.  Call the clinic if this treatment plan is not working well for you  Follow up as needed.

## 2020-08-25 NOTE — Progress Notes (Signed)
    Follow-up Note  RE: PHILLIPPA STRAUB MRN: [id number] DOB: [birth year] Date of Office Visit: 08/25/2020  Primary care provider: Catha Gosselin, MD Referring provider: Catha Gosselin, MD   Lachlyn returns to the office today for the final patch test interpretation, given suspected history of contact dermatitis.    Diagnostics:   Metals testing 96-hour hour reading: Negative to chromium chloride 1%, potassium dichromate 0.25%, cobalt chloride hexahydrate 1%, copper sulfate pentahydrate 2%, molybdenum chloride 0.5%, titanium 0.1%, tantal, magnese chloride 0.5%, nickel sulfate hexahydrate 5%, aluminum hydroxide 10%, and vanadium pentoxide 10%.  Plan:   Allergic contact dermatitis Your final metals patch testing reading was negative today Make an appointment for environmental allergy testing if interested.  Thank you for the opportunity to care for this patient.  Please do not hesitate to contact me with questions.  Thermon Leyland, FNP [facility]

## 2020-08-28 DIAGNOSIS — M1712 Unilateral primary osteoarthritis, left knee: Secondary | ICD-10-CM | POA: Diagnosis not present

## 2020-08-28 DIAGNOSIS — Z01818 Encounter for other preprocedural examination: Secondary | ICD-10-CM | POA: Diagnosis not present

## 2020-08-28 DIAGNOSIS — I10 Essential (primary) hypertension: Secondary | ICD-10-CM | POA: Diagnosis not present

## 2020-09-04 NOTE — Progress Notes (Addendum)
COVID Vaccine Completed:  x3 Date COVID Vaccine completed: Has received booster:  02-2020 COVID vaccine manufacturer: Pfizer    [employer]   Date of COVID positive in last 90 days:  N/A  PCP - Dr. Tenny Craw Cardiologist - Kristeen Miss, MD  Cardiac clearance in Epic 08-11-20 by Dr. Elease Hashimoto.    Chest x-ray - N/A EKG - 08-11-20 Epic Stress Test - 2013 Epic ECHO - 2013 Epic Cardiac Cath -  Pacemaker/ICD device last checked: Spinal Cord Stimulator:  Sleep Study - N/A CPAP -   Fasting Blood Sugar - N/A Checks Blood Sugar _____ times a day  Blood Thinner Instructions: Aspirin Instructions:  ASA 81 mg.  Okay to hold 5-7 days prior per cardiology. Last Dose:  09-04-20  Activity level:  Unable to climb stairs due to back pain and knee pain.  Patient lives alone in independent living at [facility].  Performs all ADLs and continues to drive.      Anesthesia review: Followed by cardiology for HTN, hyperlipidemia and leg swelling.  Leg swelling has resolved with better BP control.  Patient denies shortness of breath, fever, cough and chest pain at PAT appointment   Patient verbalized understanding of instructions that were given to them at the PAT appointment. Patient was also instructed that they will need to review over the PAT instructions again at home before surgery.

## 2020-09-04 NOTE — Patient Instructions (Addendum)
DUE TO COVID-19 ONLY ONE VISITOR IS ALLOWED TO COME WITH YOU AND STAY IN THE WAITING ROOM ONLY DURING PRE OP AND PROCEDURE.   **NO VISITORS ARE ALLOWED IN THE SHORT STAY AREA OR RECOVERY ROOM!!**  IF YOU WILL BE ADMITTED INTO THE HOSPITAL YOU ARE ALLOWED ONLY TWO SUPPORT PEOPLE DURING VISITATION HOURS ONLY (10AM -8PM)   . The support person(s) may change daily. . The support person(s) must pass our screening, gel in and out, and wear a mask at all times, including in the patient's room. . Patients must also wear a mask when staff or their support person are in the room.  No visitors under the age of 19. Any visitor under the age of 62 must be accompanied by an adult.    COVID SWAB TESTING MUST BE COMPLETED ON:  Thursday, 09-07-20 @ 2:00 PM   [address] [place], [place] [address]  (Must self quarantine after testing. Follow instructions on handout.)        Your procedure is scheduled on: Monday, 09-11-20    Report to [facility] Main  Entrance    Report to admitting at 10:10 AM   Call this number if you have problems the morning of surgery [phone]   Do not eat food :After Midnight.   May have liquids until 9:30 AM day of surgery  CLEAR LIQUID DIET  Foods Allowed                                                                     Foods Excluded  Water, Black Coffee and tea, regular and decaf              liquids that you cannot  Plain Jell-O in any flavor  (No red)                                     see through such as: Fruit ices (not with fruit pulp)                                      milk, soups, orange juice              Iced Popsicles (No red)                                      All solid food                                   Apple juices Sports drinks like Gatorade (No red) Lightly seasoned clear broth or consume(fat free) Sugar, honey syrup    Complete one Ensure drink the morning of surgery at  9:30 AM the day of surgery.      1. The day of  surgery:  ? Drink ONE (1) Pre-Surgery Clear Ensure or G2 by am the morning of surgery. Drink in one sitting. Do not sip.  ? This drink was given  to you during your hospital  pre-op appointment visit. ? Nothing else to drink after completing the  Pre-Surgery Clear Ensure or G2.          If you have questions, please contact your surgeon's office.     Oral Hygiene is also important to reduce your risk of infection.                                    Remember - BRUSH YOUR TEETH THE MORNING OF SURGERY WITH YOUR REGULAR TOOTHPASTE   Do NOT smoke after Midnight   Take these medicines the morning of surgery with A SIP OF WATER: Ezetimibe, Nebivolol, Pantoprazole, Pregabalin, Sertraline, Rosuvastatin                                You may not have any metal on your body including hair pins, jewelry, and body piercings             Do not wear make-up, lotions, powders, perfumes/cologne, or deodorant             Do not wear nail polish.  Do not shave  48 hours prior to surgery.    Do not bring valuables to the hospital. [facility] IS NOT             RESPONSIBLE   FOR VALUABLES.   Contacts, dentures or bridgework may not be worn into surgery.     Patients discharged the day of surgery will not be allowed to drive home.   Special Instructions: Bring a copy of your healthcare power of attorney and living will documents         the day of surgery if you haven't  scanned them in before.              Please read over the following fact sheets you were given: IF YOU HAVE QUESTIONS ABOUT YOUR PRE OP INSTRUCTIONS PLEASE CALL  [phone] [facility] - Preparing for Surgery Before surgery, you can play an important role.  Because skin is not sterile, your skin needs to be as free of germs as possible.  You can reduce the number of germs on your skin by washing with CHG (chlorahexidine gluconate) soap before surgery.  CHG is an antiseptic cleaner which kills germs and bonds with the skin to  continue killing germs even after washing. Please DO NOT use if you have an allergy to CHG or antibacterial soaps.  If your skin becomes reddened/irritated stop using the CHG and inform your nurse when you arrive at Short Stay. Do not shave (including legs and underarms) for at least 48 hours prior to the first CHG shower.  You may shave your face/neck.  Please follow these instructions carefully:  1.  Shower with CHG Soap the night before surgery and the  morning of surgery.  2.  If you choose to wash your hair, wash your hair first as usual with your normal  shampoo.  3.  After you shampoo, rinse your hair and body thoroughly to remove the shampoo.                             4.  Use CHG as you would any other liquid soap.  You can apply chg directly to the  skin and wash.  Gently with a scrungie or clean washcloth.  5.  Apply the CHG Soap to your body ONLY FROM THE NECK DOWN.   Do   not use on face/ open                           Wound or open sores. Avoid contact with eyes, ears mouth and   genitals (private parts).                       Wash face,  Genitals (private parts) with your normal soap.             6.  Wash thoroughly, paying special attention to the area where your    surgery  will be performed.  7.  Thoroughly rinse your body with warm water from the neck down.  8.  DO NOT shower/wash with your normal soap after using and rinsing off the CHG Soap.                9.  Pat yourself dry with a clean towel.            10.  Wear clean pajamas.            11.  Place clean sheets on your bed the night of your first shower and do not  sleep with pets. Day of Surgery : Do not apply any lotions/deodorants the morning of surgery.  Please wear clean clothes to the hospital/surgery center.  FAILURE TO FOLLOW THESE INSTRUCTIONS MAY RESULT IN THE CANCELLATION OF YOUR SURGERY  PATIENT SIGNATURE_________________________________  NURSE  SIGNATURE__________________________________  ________________________________________________________________________   Adam Phenix  An incentive spirometer is a tool that can help keep your lungs clear and active. This tool measures how well you are filling your lungs with each breath. Taking long deep breaths may help reverse or decrease the chance of developing breathing (pulmonary) problems (especially infection) following:  A long period of time when you are unable to move or be active. BEFORE THE PROCEDURE   If the spirometer includes an indicator to show your best effort, your nurse or respiratory therapist will set it to a desired goal.  If possible, sit up straight or lean slightly forward. Try not to slouch.  Hold the incentive spirometer in an upright position. INSTRUCTIONS FOR USE  1. Sit on the edge of your bed if possible, or sit up as far as you can in bed or on a chair. 2. Hold the incentive spirometer in an upright position. 3. Breathe out normally. 4. Place the mouthpiece in your mouth and seal your lips tightly around it. 5. Breathe in slowly and as deeply as possible, raising the piston or the ball toward the top of the column. 6. Hold your breath for 3-5 seconds or for as long as possible. Allow the piston or ball to fall to the bottom of the column. 7. Remove the mouthpiece from your mouth and breathe out normally. 8. Rest for a few seconds and repeat Steps 1 through 7 at least 10 times every 1-2 hours when you are awake. Take your time and take a few normal breaths between deep breaths. 9. The spirometer may include an indicator to show your best effort. Use the indicator as a goal to work toward during each repetition. 10. After each set of 10 deep breaths, practice coughing to be sure your lungs are clear. If you  have an incision (the cut made at the time of surgery), support your incision when coughing by placing a pillow or rolled up towels firmly  against it. Once you are able to get out of bed, walk around indoors and cough well. You may stop using the incentive spirometer when instructed by your caregiver.  RISKS AND COMPLICATIONS  Take your time so you do not get dizzy or light-headed.  If you are in pain, you may need to take or ask for pain medication before doing incentive spirometry. It is harder to take a deep breath if you are having pain. AFTER USE  Rest and breathe slowly and easily.  It can be helpful to keep track of a log of your progress. Your caregiver can provide you with a simple table to help with this. If you are using the spirometer at home, follow these instructions: SEEK MEDICAL CARE IF:   You are having difficultly using the spirometer.  You have trouble using the spirometer as often as instructed.  Your pain medication is not giving enough relief while using the spirometer.  You develop fever of 100.5 F (38.1 C) or higher. SEEK IMMEDIATE MEDICAL CARE IF:   You cough up bloody sputum that had not been present before.  You develop fever of 102 F (38.9 C) or greater.  You develop worsening pain at or near the incision site. MAKE SURE YOU:   Understand these instructions.  Will watch your condition.  Will get help right away if you are not doing well or get worse. Document Released: 09/23/2006 Document Revised: 08/05/2011 Document Reviewed: 11/24/2006 [facility] Patient Information 2014 [place], [place].   ________________________________________________________________________

## 2020-09-05 ENCOUNTER — Other Ambulatory Visit: Payer: Self-pay

## 2020-09-05 ENCOUNTER — Encounter (HOSPITAL_COMMUNITY)
Admission: RE | Admit: 2020-09-05 | Discharge: 2020-09-05 | Disposition: A | Discharge status: Home / Self Care | Payer: Medicare PPO | Source: Ambulatory Visit | Attending: Orthopedic Surgery | Admitting: Orthopedic Surgery

## 2020-09-05 ENCOUNTER — Encounter (HOSPITAL_COMMUNITY): Payer: Self-pay

## 2020-09-05 DIAGNOSIS — Z01812 Encounter for preprocedural laboratory examination: Secondary | ICD-10-CM | POA: Insufficient documentation

## 2020-09-05 HISTORY — DX: Pneumonia, unspecified organism: J18.9

## 2020-09-05 HISTORY — DX: Anemia, unspecified: D64.9

## 2020-09-05 HISTORY — DX: Personal history of other diseases of the nervous system and sense organs: Z86.69

## 2020-09-05 HISTORY — DX: Anxiety disorder, unspecified: F41.9

## 2020-09-05 LAB — CBC
HCT: 41.1 % (ref 36.0–46.0)
Hemoglobin: 13.5 g/dL (ref 12.0–15.0)
MCH: 31.2 pg (ref 26.0–34.0)
MCHC: 32.8 g/dL (ref 30.0–36.0)
MCV: 94.9 fL (ref 80.0–100.0)
Platelets: 261 10*3/uL (ref 150–400)
RBC: 4.33 MIL/uL (ref 3.87–5.11)
RDW: 13.7 % (ref 11.5–15.5)
WBC: 6.5 10*3/uL (ref 4.0–10.5)
nRBC: 0 % (ref 0.0–0.2)

## 2020-09-05 LAB — COMPREHENSIVE METABOLIC PANEL
ALT: 20 U/L (ref 0–44)
AST: 23 U/L (ref 15–41)
Albumin: 4 g/dL (ref 3.5–5.0)
Alkaline Phosphatase: 73 U/L (ref 38–126)
Anion gap: 7 (ref 5–15)
BUN: 13 mg/dL (ref 8–23)
CO2: 31 mmol/L (ref 22–32)
Calcium: 9.4 mg/dL (ref 8.9–10.3)
Chloride: 102 mmol/L (ref 98–111)
Creatinine, Ser: 0.56 mg/dL (ref 0.44–1.00)
GFR, Estimated: >60 mL/min (ref >60)
Glucose, Bld: 106 mg/dL — ABNORMAL HIGH (ref 70–99)
Potassium: 3.9 mmol/L (ref 3.5–5.1)
Sodium: 140 mmol/L (ref 135–145)
Total Bilirubin: 0.2 mg/dL — ABNORMAL LOW (ref 0.3–1.2)
Total Protein: 7.3 g/dL (ref 6.5–8.1)

## 2020-09-05 LAB — PROTIME-INR
INR: 1.1 (ref 0.8–1.2)
Prothrombin Time: 14.1 seconds (ref 11.4–15.2)

## 2020-09-05 LAB — SURGICAL PCR SCREEN
MRSA, PCR: NEGATIVE
Staphylococcus aureus: NEGATIVE

## 2020-09-07 ENCOUNTER — Other Ambulatory Visit (HOSPITAL_COMMUNITY): Payer: Medicare PPO

## 2020-09-08 NOTE — H&P (Signed)
TOTAL KNEE ADMISSION H&P  Patient is being admitted for right total knee arthroplasty.  Subjective:  Chief Complaint: Right knee pain.  HPI: Cheryl Hinton, 85 y.o. female has a history of pain and functional disability in the right knee due to arthritis and has failed non-surgical conservative treatments for greater than 12 weeks to include NSAID's and/or analgesics, corticosteriod injections and activity modification. Onset of symptoms was gradual, starting >10 years ago with gradually worsening course since that time. The patient noted prior procedures on the knee to include  arthroscopy and menisectomy on the right knee.  Patient currently rates pain in the right knee at 8 out of 10 with activity. Patient has night pain, pain that interferes with activities of daily living, pain with passive range of motion and crepitus. Patient has evidence of periarticular osteophytes and joint space narrowing by imaging studies. There is no active infection.  Patient Active Problem List   Diagnosis Date Noted  . Allergic contact dermatitis 08/21/2020  . Leg swelling 06/29/2019  . Upper airway cough syndrome 10/02/2017  . Shortness of breath 01/13/2012  . NAUSEA 05/25/2010  . BARRETTS ESOPHAGUS 01/10/2009  . CONSTIPATION 01/10/2009  . GERD 11/20/2007  . DYSPHAGIA 11/20/2007  . Hyperlipidemia 11/19/2007  . Essential hypertension 11/19/2007  . ESOPHAGEAL STRICTURE 11/19/2007  . GASTRITIS 11/19/2007  . DIVERTICULOSIS, COLON 11/19/2007  . DEGENERATIVE JOINT DISEASE 11/19/2007    Past Medical History:  Diagnosis Date  . Anemia    Prior to hysterectomy  . Anxiety   . Arthritis   . Barrett's esophagus   . Colon polyps 1985  . Diverticulosis   . Endometriosis   . Esophageal stricture   . GERD (gastroesophageal reflux disease)   . Hx of migraine headaches   . Hyperlipidemia   . Hypertension   . IBS (irritable bowel syndrome)   . Pneumonia   . Status post dilation of esophageal narrowing    . Urticaria     Past Surgical History:  Procedure Laterality Date  . ADENOIDECTOMY    . APPENDECTOMY  1956  . BREAST BIOPSY Left   . CATARACT EXTRACTION W/ INTRAOCULAR LENS IMPLANT    . COLONOSCOPY    . ESOPHAGEAL MANOMETRY N/A 01/13/2017   Procedure: ESOPHAGEAL MANOMETRY (EM);  Surgeon: Mauri Pole, MD;  Location: [facility];  Service: Endoscopy;  Laterality: N/A;  . ESOPHAGOGASTRODUODENOSCOPY ENDOSCOPY    . FOOT SURGERY Bilateral    tumor removed  . HEMORROIDECTOMY    . KNEE SURGERY Bilateral [phone]   x 3  . PARTIAL HYSTERECTOMY    . TONSILLECTOMY    . TONSILLECTOMY    . TOTAL ABDOMINAL HYSTERECTOMY     for endometriosis    Prior to Admission medications   Medication Sig Start Date End Date Taking? Authorizing Provider  ALPRAZolam Duanne Moron) 0.5 MG tablet Take 0.25-0.5 mg by mouth at bedtime as needed for sleep. 11/27/11  Yes [provider]  aspirin 81 MG chewable tablet Chew 1 tablet by mouth daily.   Yes [provider]  CALCIUM PO Take 2 tablets by mouth daily.   Yes [provider]  Cholecalciferol (VITAMIN D) 50 MCG (2000 UT) tablet Take 2,000 Units by mouth daily.   Yes [provider]  ezetimibe (ZETIA) 10 MG tablet Take 1 tablet (10 mg total) by mouth daily. 04/18/20  Yes Nahser, Wonda Cheng, MD  hydrOXYzine (ATARAX/VISTARIL) 50 MG tablet Take 50 mg by mouth at bedtime.   Yes [provider]  lidocaine (LIDODERM)  5 % Place 1 patch onto the skin daily as needed (pain). Remove & Discard patch within 12 hours or as directed by MD   Yes [provider]  Multiple Vitamins-Minerals (EYE VITAMINS PO) Take 1 tablet by mouth daily.   Yes [provider]  naloxone (NARCAN) 4 MG/0.1ML LIQD nasal spray kit Place 4 mg into the nose as needed (overdose).   Yes [provider]  nebivolol (BYSTOLIC) 5 MG tablet Take 1 tablet (5 mg total) by mouth daily. 10/20/19  Yes Nahser, Wonda Cheng, MD  Omega-3 Fatty  Acids (FISH OIL) 1200 MG CAPS Take 1 capsule by mouth daily.   Yes [provider]  oxyCODONE-acetaminophen (PERCOCET) 10-325 MG tablet Take 1 tablet by mouth 4 (four) times daily. 09/06/16  Yes [provider]  pantoprazole (PROTONIX) 40 MG tablet Take 40 mg by mouth daily.   Yes [provider]  polyvinyl alcohol (LIQUIFILM TEARS) 1.4 % ophthalmic solution Place 1 drop into both eyes as needed for dry eyes.   Yes [provider]  potassium chloride (KLOR-CON) 10 MEQ tablet Take 1 tablet (10 mEq total) by mouth 2 (two) times daily. Patient taking differently: Take 20 mEq by mouth daily. 03/10/20  Yes Nahser, Wonda Cheng, MD  pregabalin (LYRICA) 50 MG capsule Take 50 mg by mouth daily. 07/31/20  Yes [provider]  Red Yeast Rice 600 MG CAPS Take 1,200 mg by mouth daily.   Yes [provider]  rosuvastatin (CRESTOR) 10 MG tablet Take 10 mg by mouth once a week.   Yes [provider]  sertraline (ZOLOFT) 50 MG tablet Take 50 mg by mouth daily. 01/03/19  Yes [provider]  sucralfate (CARAFATE) 1 GM/10ML suspension Take 10 mLs by mouth 3 (three) times daily as needed (acid reflux). 03/27/20  Yes [provider]  telmisartan-hydrochlorothiazide (MICARDIS HCT) 80-25 MG tablet TAKE 1 TABLET BY MOUTH ONCE DAILY Patient taking differently: Take 1 tablet by mouth daily. 11/03/17  Yes Tanda Rockers, MD  valACYclovir (VALTREX) 500 MG tablet Take 500 mg by mouth daily. 05/12/19  Yes [provider]  vitamin C (ASCORBIC ACID) 500 MG tablet Take 500 mg by mouth daily.   Yes [provider]    Allergies  Allergen Reactions  . Duloxetine Hcl     Other reaction(s): blurry vision  . Influenza Vaccines     Other reaction(s): "side effects"  . Lisinopril     Other reaction(s): cough  . Nsaids     Other reaction(s): gastritis  . Other     Other reaction(s): GI upset  . Praluent [Alirocumab]     Muscle pains  .  Prednisone     Other reaction(s): Eye Swelling  . Repatha [Evolocumab]     itching  . Statins     Other reaction(s): myalgias    Social History   Socioeconomic History  . Marital status: Widowed    Spouse name: Not on file  . Number of children: 1  . Years of education: Not on file  . Highest education level: Not on file  Occupational History  . Occupation: RETIRED    Employer: RETIRED  Tobacco Use  . Smoking status: Former Smoker    Packs/day: 0.50    Years: 10.00    Pack years: 5.00    Types: Cigarettes    Quit date: 05/27/1966    Years since quitting: 54.3  . Smokeless tobacco: Never Used  Vaping Use  . Vaping Use: Never  used  Substance and Sexual Activity  . Alcohol use: No  . Drug use: No  . Sexual activity: Not Currently  Other Topics Concern  . Not on file  Social History Narrative  . Not on file   Social Determinants of Health   Financial Resource Strain: Not on file  Food Insecurity: Not on file  Transportation Needs: Not on file  Physical Activity: Not on file  Stress: Not on file  Social Connections: Not on file  Intimate Partner Violence: Not on file    Tobacco Use: Medium Risk  . Smoking Tobacco Use: Former Smoker  . Smokeless Tobacco Use: Never Used   Social History   Substance and Sexual Activity  Alcohol Use No    Family History  Problem Relation Age of Onset  . Heart disease Father   . Heart failure Sister   . Allergic rhinitis Sister   . Allergic rhinitis Brother   . Colon cancer Mother   . Diabetes Mother   . Allergic rhinitis Mother     Review of Systems  Constitutional: Negative for chills and fever.  HENT: Negative for congestion, sore throat and tinnitus.   Eyes: Negative for double vision, photophobia and pain.  Respiratory: Negative for cough, shortness of breath and wheezing.   Cardiovascular: Negative for chest pain, palpitations and orthopnea.  Gastrointestinal: Negative for heartburn, nausea and vomiting.   Genitourinary: Negative for dysuria, frequency and urgency.  Musculoskeletal: Positive for joint pain.  Neurological: Negative for dizziness, weakness and headaches.    Objective:  Physical Exam: Well nourished and well developed.  General: Alert and oriented x3, cooperative and pleasant, no acute distress.  Head: normocephalic, atraumatic, neck supple.  Eyes: EOMI.  Respiratory: breath sounds clear in all fields, no wheezing, rales, or rhonchi. Cardiovascular: Regular rate and rhythm, no murmurs, gallops or rubs.  Abdomen: non-tender to palpation and soft, normoactive bowel sounds. Musculoskeletal:  Right Knee Exam:  Moderate effusion present. No swelling present.  Valgus deformity.  The range of motion is: 5 to 120 degrees.  Moderate crepitus on range of motion of the knee.  Positive lateral greater than medial joint line tenderness.  The knee is stable.   Calves soft and nontender. Motor function intact in LE. Strength 5/5 LE bilaterally. Neuro: Distal pulses 2+. Sensation to light touch intact in LE.  Imaging Review Plain radiographs demonstrate severe degenerative joint disease of the right knee. The overall alignment is neutral. The bone quality appears to be adequate for age and reported activity level.  Assessment/Plan:  End stage arthritis, right knee   The patient history, physical examination, clinical judgment of the provider and imaging studies are consistent with end stage degenerative joint disease of the right knee and total knee arthroplasty is deemed medically necessary. The treatment options including medical management, injection therapy arthroscopy and arthroplasty were discussed at length. The risks and benefits of total knee arthroplasty were presented and reviewed. The risks due to aseptic loosening, infection, stiffness, patella tracking problems, thromboembolic complications and other imponderables were discussed. The patient acknowledged the  explanation, agreed to proceed with the plan and consent was signed. Patient is being admitted for inpatient treatment for surgery, pain control, PT, OT, prophylactic antibiotics, VTE prophylaxis, progressive ambulation and ADLs and discharge planning. The patient is planning to be discharged home.   Patient's anticipated LOS is less than 2 midnights, meeting these requirements: - Younger than 76 - Lives within 1 hour of care - Has a competent adult at home  to recover with post-op recover - NO history of  - Chronic pain requiring opiods  - Diabetes  - Coronary Artery Disease  - Heart failure  - Heart attack  - Stroke  - DVT/VTE  - Cardiac arrhythmia  - Respiratory Failure/COPD  - Renal failure  - Anemia  - Advanced Liver disease  Therapy Plans: Outpatient therapy through [facility] Disposition: Home to [facility] Planned DVT Prophylaxis: Aspirin 325 mg BID DME Needed: Gil[facility] Rile PCP: Melinda Crutch, MD (clearance received) Cardiologist: Grayland Jack, MD (clearance received) TXA: IV Allergies: Prednisone (eye swelling) Anesthesia Concerns: None BMI: 27.7 Last HgbA1c: Not diabetic  Pharmacy: [facility] ([facility]  Other: - Takes Percocet 10-325 mg four-five x daily - Discussed dilaudid postoperatively  - Nickel allergy testing was negative  - Patient was instructed on what medications to stop prior to surgery. - Follow-up visit in 2 weeks with Dr. Wynelle Link - Begin physical therapy following surgery - Pre-operative lab work as pre-surgical testing - Prescriptions will be provided in hospital at time of discharge  Theresa Duty, PA-C Orthopedic Surgery [facility] [place]

## 2020-09-10 MED ORDER — BUPIVACAINE LIPOSOME 1.3 % IJ SUSP
20.0000 mL | INTRAMUSCULAR | Status: DC
  Filled 2020-09-10: qty 20

## 2020-09-11 ENCOUNTER — Ambulatory Visit (HOSPITAL_COMMUNITY): Payer: Medicare PPO | Admitting: Physician Assistant

## 2020-09-11 ENCOUNTER — Ambulatory Visit (HOSPITAL_COMMUNITY): Payer: Medicare PPO | Admitting: Certified Registered Nurse Anesthetist

## 2020-09-11 ENCOUNTER — Encounter (HOSPITAL_COMMUNITY)
Admission: RE | Disposition: A | Discharge status: Skilled Nursing Facility | Payer: Self-pay | Source: Home / Self Care | Attending: Orthopedic Surgery

## 2020-09-11 ENCOUNTER — Other Ambulatory Visit: Payer: Self-pay

## 2020-09-11 ENCOUNTER — Encounter (HOSPITAL_COMMUNITY): Payer: Self-pay | Admitting: Orthopedic Surgery

## 2020-09-11 ENCOUNTER — Inpatient Hospital Stay (HOSPITAL_COMMUNITY)
Admission: RE | Admit: 2020-09-11 | Discharge: 2020-09-15 | DRG: 470 | Disposition: A | Discharge status: Skilled Nursing Facility | Payer: Medicare PPO | Attending: Orthopedic Surgery | Admitting: Orthopedic Surgery

## 2020-09-11 DIAGNOSIS — M1711 Unilateral primary osteoarthritis, right knee: Secondary | ICD-10-CM | POA: Diagnosis present

## 2020-09-11 DIAGNOSIS — F419 Anxiety disorder, unspecified: Secondary | ICD-10-CM | POA: Diagnosis present

## 2020-09-11 DIAGNOSIS — Z87891 Personal history of nicotine dependence: Secondary | ICD-10-CM | POA: Diagnosis not present

## 2020-09-11 DIAGNOSIS — K22719 Barrett's esophagus with dysplasia, unspecified: Secondary | ICD-10-CM | POA: Diagnosis not present

## 2020-09-11 DIAGNOSIS — M545 Low back pain, unspecified: Secondary | ICD-10-CM | POA: Diagnosis not present

## 2020-09-11 DIAGNOSIS — E876 Hypokalemia: Secondary | ICD-10-CM | POA: Diagnosis not present

## 2020-09-11 DIAGNOSIS — Z20822 Contact with and (suspected) exposure to covid-19: Secondary | ICD-10-CM | POA: Diagnosis present

## 2020-09-11 DIAGNOSIS — Z79899 Other long term (current) drug therapy: Secondary | ICD-10-CM | POA: Diagnosis not present

## 2020-09-11 DIAGNOSIS — M179 Osteoarthritis of knee, unspecified: Secondary | ICD-10-CM | POA: Diagnosis present

## 2020-09-11 DIAGNOSIS — M1712 Unilateral primary osteoarthritis, left knee: Secondary | ICD-10-CM | POA: Diagnosis present

## 2020-09-11 DIAGNOSIS — Z7982 Long term (current) use of aspirin: Secondary | ICD-10-CM | POA: Diagnosis not present

## 2020-09-11 DIAGNOSIS — M170 Bilateral primary osteoarthritis of knee: Secondary | ICD-10-CM | POA: Diagnosis not present

## 2020-09-11 DIAGNOSIS — I10 Essential (primary) hypertension: Secondary | ICD-10-CM | POA: Diagnosis present

## 2020-09-11 DIAGNOSIS — Z743 Need for continuous supervision: Secondary | ICD-10-CM | POA: Diagnosis not present

## 2020-09-11 DIAGNOSIS — E785 Hyperlipidemia, unspecified: Secondary | ICD-10-CM | POA: Diagnosis present

## 2020-09-11 DIAGNOSIS — K219 Gastro-esophageal reflux disease without esophagitis: Secondary | ICD-10-CM | POA: Diagnosis present

## 2020-09-11 DIAGNOSIS — R279 Unspecified lack of coordination: Secondary | ICD-10-CM | POA: Diagnosis not present

## 2020-09-11 DIAGNOSIS — G8918 Other acute postprocedural pain: Secondary | ICD-10-CM | POA: Diagnosis not present

## 2020-09-11 DIAGNOSIS — E782 Mixed hyperlipidemia: Secondary | ICD-10-CM | POA: Diagnosis not present

## 2020-09-11 DIAGNOSIS — Z90710 Acquired absence of both cervix and uterus: Secondary | ICD-10-CM | POA: Diagnosis not present

## 2020-09-11 DIAGNOSIS — D500 Iron deficiency anemia secondary to blood loss (chronic): Secondary | ICD-10-CM | POA: Diagnosis not present

## 2020-09-11 DIAGNOSIS — M1710 Unilateral primary osteoarthritis, unspecified knee: Secondary | ICD-10-CM | POA: Diagnosis present

## 2020-09-11 DIAGNOSIS — R5381 Other malaise: Secondary | ICD-10-CM | POA: Diagnosis not present

## 2020-09-11 DIAGNOSIS — K589 Irritable bowel syndrome without diarrhea: Secondary | ICD-10-CM | POA: Diagnosis not present

## 2020-09-11 DIAGNOSIS — M79606 Pain in leg, unspecified: Secondary | ICD-10-CM | POA: Diagnosis not present

## 2020-09-11 DIAGNOSIS — G43909 Migraine, unspecified, not intractable, without status migrainosus: Secondary | ICD-10-CM | POA: Diagnosis not present

## 2020-09-11 HISTORY — PX: TOTAL KNEE ARTHROPLASTY: SHX125

## 2020-09-11 LAB — SARS CORONAVIRUS 2 BY RT PCR (HOSPITAL ORDER, PERFORMED IN ~~LOC~~ HOSPITAL LAB): SARS Coronavirus 2: NEGATIVE

## 2020-09-11 SURGERY — ARTHROPLASTY, KNEE, TOTAL
Anesthesia: General | Site: Knee | Laterality: Right

## 2020-09-11 MED ORDER — FLEET ENEMA 7-19 GM/118ML RE ENEM: 1.0000 | ENEMA | Freq: Once | RECTAL | Status: DC | PRN

## 2020-09-11 MED ORDER — POVIDONE-IODINE 10 % EX SWAB
2.0000 "application " | Freq: Once | CUTANEOUS | Status: AC
  Administered 2020-09-11: 2 via TOPICAL

## 2020-09-11 MED ORDER — CHLORHEXIDINE GLUCONATE 0.12 % MT SOLN: 15.0000 mL | Freq: Once | OROMUCOSAL | Status: AC

## 2020-09-11 MED ORDER — MIDAZOLAM HCL 2 MG/2ML IJ SOLN
1.0000 mg | INTRAMUSCULAR | Status: DC
  Administered 2020-09-11: 1 mg via INTRAVENOUS
  Filled 2020-09-11: qty 2

## 2020-09-11 MED ORDER — ACETAMINOPHEN 500 MG PO TABS
1000.0000 mg | ORAL_TABLET | Freq: Once | ORAL | Status: AC
  Administered 2020-09-11: 1000 mg via ORAL
  Filled 2020-09-11: qty 2

## 2020-09-11 MED ORDER — STERILE WATER FOR IRRIGATION IR SOLN
Status: DC | PRN
  Administered 2020-09-11: 2000 mL

## 2020-09-11 MED ORDER — TELMISARTAN-HCTZ 80-25 MG PO TABS: 1.0000 | ORAL_TABLET | Freq: Every day | ORAL | Status: DC

## 2020-09-11 MED ORDER — ONDANSETRON HCL 4 MG/2ML IJ SOLN
INTRAMUSCULAR | Status: DC | PRN
  Administered 2020-09-11: 4 mg via INTRAVENOUS

## 2020-09-11 MED ORDER — SERTRALINE HCL 50 MG PO TABS
50.0000 mg | ORAL_TABLET | Freq: Every day | ORAL | Status: DC
  Administered 2020-09-11 – 2020-09-15 (×5): 50 mg via ORAL
  Filled 2020-09-11 (×5): qty 1

## 2020-09-11 MED ORDER — HYDROMORPHONE HCL 1 MG/ML IJ SOLN
0.5000 mg | INTRAMUSCULAR | Status: AC | PRN
Start: 2020-09-11 — End: 2020-09-11
  Administered 2020-09-11 (×8): 0.25 mg via INTRAVENOUS

## 2020-09-11 MED ORDER — HYDROCHLOROTHIAZIDE 25 MG PO TABS
25.0000 mg | ORAL_TABLET | Freq: Every day | ORAL | Status: DC
  Administered 2020-09-12 – 2020-09-15 (×4): 25 mg via ORAL
  Filled 2020-09-11 (×4): qty 1

## 2020-09-11 MED ORDER — FENTANYL CITRATE (PF) 100 MCG/2ML IJ SOLN
INTRAMUSCULAR | Status: AC
  Filled 2020-09-11: qty 2

## 2020-09-11 MED ORDER — SODIUM CHLORIDE (PF) 0.9 % IJ SOLN
INTRAMUSCULAR | Status: AC
  Filled 2020-09-11: qty 10

## 2020-09-11 MED ORDER — IRBESARTAN 150 MG PO TABS
300.0000 mg | ORAL_TABLET | Freq: Every day | ORAL | Status: DC
  Administered 2020-09-12 – 2020-09-15 (×4): 300 mg via ORAL
  Filled 2020-09-11 (×4): qty 2

## 2020-09-11 MED ORDER — PROPOFOL 500 MG/50ML IV EMUL
INTRAVENOUS | Status: DC | PRN
  Administered 2020-09-11: 25 ug/kg/min via INTRAVENOUS

## 2020-09-11 MED ORDER — POTASSIUM CHLORIDE CRYS ER 10 MEQ PO TBCR
20.0000 meq | EXTENDED_RELEASE_TABLET | Freq: Every day | ORAL | Status: DC
  Administered 2020-09-12 – 2020-09-15 (×4): 20 meq via ORAL
  Filled 2020-09-11 (×4): qty 2

## 2020-09-11 MED ORDER — ONDANSETRON HCL 4 MG PO TABS
4.0000 mg | ORAL_TABLET | Freq: Four times a day (QID) | ORAL | Status: DC | PRN
  Administered 2020-09-12: 4 mg via ORAL
  Filled 2020-09-11: qty 1

## 2020-09-11 MED ORDER — POLYETHYLENE GLYCOL 3350 17 G PO PACK
17.0000 g | PACK | Freq: Every day | ORAL | Status: DC | PRN
  Administered 2020-09-14: 17 g via ORAL
  Filled 2020-09-11 (×2): qty 1

## 2020-09-11 MED ORDER — HYDROMORPHONE HCL 1 MG/ML IJ SOLN
INTRAMUSCULAR | Status: AC
  Filled 2020-09-11: qty 1

## 2020-09-11 MED ORDER — LACTATED RINGERS IV SOLN: INTRAVENOUS | Status: DC

## 2020-09-11 MED ORDER — HYDROMORPHONE HCL 2 MG PO TABS
2.0000 mg | ORAL_TABLET | ORAL | Status: DC | PRN
  Administered 2020-09-11 (×2): 2 mg via ORAL
  Administered 2020-09-12 – 2020-09-13 (×6): 4 mg via ORAL
  Filled 2020-09-11: qty 1
  Filled 2020-09-11 (×7): qty 2
  Filled 2020-09-11: qty 1

## 2020-09-11 MED ORDER — ORAL CARE MOUTH RINSE
15.0000 mL | Freq: Once | OROMUCOSAL | Status: AC
  Administered 2020-09-11: 15 mL via OROMUCOSAL

## 2020-09-11 MED ORDER — LIDOCAINE 2% (20 MG/ML) 5 ML SYRINGE
INTRAMUSCULAR | Status: AC
  Filled 2020-09-11: qty 5

## 2020-09-11 MED ORDER — TRANEXAMIC ACID-NACL 1000-0.7 MG/100ML-% IV SOLN
1000.0000 mg | INTRAVENOUS | Status: AC
  Administered 2020-09-11: 1000 mg via INTRAVENOUS

## 2020-09-11 MED ORDER — KETOROLAC TROMETHAMINE 30 MG/ML IJ SOLN
INTRAMUSCULAR | Status: AC
  Filled 2020-09-11: qty 1

## 2020-09-11 MED ORDER — METOCLOPRAMIDE HCL 5 MG PO TABS
5.0000 mg | ORAL_TABLET | Freq: Three times a day (TID) | ORAL | Status: DC | PRN
Start: 2020-09-11 — End: 2020-09-16

## 2020-09-11 MED ORDER — FENTANYL CITRATE (PF) 100 MCG/2ML IJ SOLN
25.0000 ug | INTRAMUSCULAR | Status: DC | PRN
  Administered 2020-09-11 (×2): 50 ug via INTRAVENOUS

## 2020-09-11 MED ORDER — HYDROMORPHONE HCL 1 MG/ML IJ SOLN
0.5000 mg | INTRAMUSCULAR | Status: AC | PRN
Start: 2020-09-11 — End: 2020-09-11
  Administered 2020-09-11 (×3): 0.5 mg via INTRAVENOUS

## 2020-09-11 MED ORDER — DOCUSATE SODIUM 100 MG PO CAPS
100.0000 mg | ORAL_CAPSULE | Freq: Two times a day (BID) | ORAL | Status: DC
  Administered 2020-09-11 – 2020-09-15 (×8): 100 mg via ORAL
  Filled 2020-09-11 (×8): qty 1

## 2020-09-11 MED ORDER — RIVAROXABAN 10 MG PO TABS
10.0000 mg | ORAL_TABLET | Freq: Every day | ORAL | Status: DC
  Administered 2020-09-12 – 2020-09-15 (×4): 10 mg via ORAL
  Filled 2020-09-11 (×4): qty 1

## 2020-09-11 MED ORDER — DEXAMETHASONE SODIUM PHOSPHATE 10 MG/ML IJ SOLN: 8.0000 mg | Freq: Once | INTRAMUSCULAR | Status: DC

## 2020-09-11 MED ORDER — BUPIVACAINE LIPOSOME 1.3 % IJ SUSP
INTRAMUSCULAR | Status: DC | PRN
  Administered 2020-09-11: 20 mL

## 2020-09-11 MED ORDER — MENTHOL 3 MG MT LOZG: 1.0000 | LOZENGE | OROMUCOSAL | Status: DC | PRN

## 2020-09-11 MED ORDER — MORPHINE SULFATE (PF) 2 MG/ML IV SOLN
0.5000 mg | INTRAVENOUS | Status: DC | PRN
  Administered 2020-09-12 – 2020-09-13 (×5): 1 mg via INTRAVENOUS
  Filled 2020-09-11 (×5): qty 1

## 2020-09-11 MED ORDER — LIDOCAINE HCL (CARDIAC) PF 100 MG/5ML IV SOSY
PREFILLED_SYRINGE | INTRAVENOUS | Status: DC | PRN
  Administered 2020-09-11: 40 mg via INTRAVENOUS

## 2020-09-11 MED ORDER — VALACYCLOVIR HCL 500 MG PO TABS
500.0000 mg | ORAL_TABLET | Freq: Every day | ORAL | Status: DC
  Administered 2020-09-12 – 2020-09-15 (×4): 500 mg via ORAL
  Filled 2020-09-11 (×4): qty 1

## 2020-09-11 MED ORDER — DIPHENHYDRAMINE HCL 12.5 MG/5ML PO ELIX
12.5000 mg | ORAL_SOLUTION | ORAL | Status: DC | PRN
Start: 2020-09-11 — End: 2020-09-16

## 2020-09-11 MED ORDER — CEFAZOLIN SODIUM-DEXTROSE 2-4 GM/100ML-% IV SOLN
2.0000 g | Freq: Four times a day (QID) | INTRAVENOUS | Status: AC
  Administered 2020-09-11 – 2020-09-12 (×2): 2 g via INTRAVENOUS
  Filled 2020-09-11 (×2): qty 100

## 2020-09-11 MED ORDER — SUCRALFATE 1 GM/10ML PO SUSP: 1.0000 g | Freq: Three times a day (TID) | ORAL | Status: DC | PRN

## 2020-09-11 MED ORDER — PREGABALIN 50 MG PO CAPS
50.0000 mg | ORAL_CAPSULE | Freq: Every day | ORAL | Status: DC
  Administered 2020-09-11 – 2020-09-15 (×5): 50 mg via ORAL
  Filled 2020-09-11 (×5): qty 1

## 2020-09-11 MED ORDER — SODIUM CHLORIDE 0.9 % IV SOLN: INTRAVENOUS | Status: DC

## 2020-09-11 MED ORDER — CEFAZOLIN SODIUM-DEXTROSE 2-4 GM/100ML-% IV SOLN
2.0000 g | INTRAVENOUS | Status: AC
  Administered 2020-09-11: 2 g via INTRAVENOUS
  Filled 2020-09-11: qty 100

## 2020-09-11 MED ORDER — METOCLOPRAMIDE HCL 5 MG/ML IJ SOLN: 5.0000 mg | Freq: Three times a day (TID) | INTRAMUSCULAR | Status: DC | PRN

## 2020-09-11 MED ORDER — METHOCARBAMOL 500 MG IVPB - SIMPLE MED
500.0000 mg | Freq: Four times a day (QID) | INTRAVENOUS | Status: DC | PRN
  Filled 2020-09-11: qty 50

## 2020-09-11 MED ORDER — EPHEDRINE 5 MG/ML INJ
INTRAVENOUS | Status: AC
  Filled 2020-09-11: qty 10

## 2020-09-11 MED ORDER — METHOCARBAMOL 500 MG IVPB - SIMPLE MED
INTRAVENOUS | Status: AC
  Administered 2020-09-11: 500 mg via INTRAVENOUS
  Filled 2020-09-11: qty 50

## 2020-09-11 MED ORDER — HYDROMORPHONE HCL 1 MG/ML IJ SOLN
INTRAMUSCULAR | Status: AC
  Administered 2020-09-11: 0.5 mg via INTRAVENOUS
  Filled 2020-09-11: qty 1

## 2020-09-11 MED ORDER — ALPRAZOLAM 0.25 MG PO TABS
0.2500 mg | ORAL_TABLET | Freq: Every evening | ORAL | Status: DC | PRN
  Administered 2020-09-12 – 2020-09-14 (×3): 0.25 mg via ORAL
  Filled 2020-09-11: qty 2
  Filled 2020-09-11 (×2): qty 1

## 2020-09-11 MED ORDER — FENTANYL CITRATE (PF) 100 MCG/2ML IJ SOLN
INTRAMUSCULAR | Status: AC
  Administered 2020-09-11: 50 ug via INTRAVENOUS
  Filled 2020-09-11: qty 2

## 2020-09-11 MED ORDER — NEBIVOLOL HCL 5 MG PO TABS
5.0000 mg | ORAL_TABLET | Freq: Every day | ORAL | Status: DC
  Administered 2020-09-11 – 2020-09-15 (×5): 5 mg via ORAL
  Filled 2020-09-11 (×5): qty 1

## 2020-09-11 MED ORDER — MIDAZOLAM HCL 2 MG/2ML IJ SOLN
INTRAMUSCULAR | Status: AC
  Filled 2020-09-11: qty 2

## 2020-09-11 MED ORDER — ACETAMINOPHEN 500 MG PO TABS
1000.0000 mg | ORAL_TABLET | Freq: Four times a day (QID) | ORAL | Status: AC
  Administered 2020-09-11 – 2020-09-12 (×2): 1000 mg via ORAL
  Filled 2020-09-11 (×4): qty 2

## 2020-09-11 MED ORDER — ROPIVACAINE HCL 7.5 MG/ML IJ SOLN
INTRAMUSCULAR | Status: DC | PRN
  Administered 2020-09-11: 20 mL via PERINEURAL

## 2020-09-11 MED ORDER — KETOROLAC TROMETHAMINE 30 MG/ML IJ SOLN
30.0000 mg | Freq: Once | INTRAMUSCULAR | Status: AC
  Administered 2020-09-11: 30 mg via INTRAVENOUS

## 2020-09-11 MED ORDER — HYDROXYZINE HCL 25 MG PO TABS
50.0000 mg | ORAL_TABLET | Freq: Every day | ORAL | Status: DC
  Administered 2020-09-11 – 2020-09-14 (×4): 50 mg via ORAL
  Filled 2020-09-11 (×4): qty 2

## 2020-09-11 MED ORDER — SODIUM CHLORIDE 0.9 % IR SOLN
Status: DC | PRN
  Administered 2020-09-11: 1000 mL

## 2020-09-11 MED ORDER — FENTANYL CITRATE (PF) 100 MCG/2ML IJ SOLN
INTRAMUSCULAR | Status: DC | PRN
  Administered 2020-09-11: 25 ug via INTRAVENOUS
  Administered 2020-09-11: 50 ug via INTRAVENOUS

## 2020-09-11 MED ORDER — PANTOPRAZOLE SODIUM 40 MG PO TBEC
40.0000 mg | DELAYED_RELEASE_TABLET | Freq: Every day | ORAL | Status: DC
  Administered 2020-09-12 – 2020-09-15 (×4): 40 mg via ORAL
  Filled 2020-09-11 (×4): qty 1

## 2020-09-11 MED ORDER — 0.9 % SODIUM CHLORIDE (POUR BTL) OPTIME
TOPICAL | Status: DC | PRN
  Administered 2020-09-11: 1000 mL

## 2020-09-11 MED ORDER — BUPIVACAINE IN DEXTROSE 0.75-8.25 % IT SOLN
INTRATHECAL | Status: DC | PRN
  Administered 2020-09-11: 1.6 mL via INTRATHECAL

## 2020-09-11 MED ORDER — EPHEDRINE SULFATE-NACL 50-0.9 MG/10ML-% IV SOSY
PREFILLED_SYRINGE | INTRAVENOUS | Status: DC | PRN
  Administered 2020-09-11: 5 mg via INTRAVENOUS

## 2020-09-11 MED ORDER — METHOCARBAMOL 500 MG PO TABS
500.0000 mg | ORAL_TABLET | Freq: Four times a day (QID) | ORAL | Status: DC | PRN
  Administered 2020-09-11 – 2020-09-15 (×9): 500 mg via ORAL
  Filled 2020-09-11 (×10): qty 1

## 2020-09-11 MED ORDER — PHENOL 1.4 % MT LIQD: 1.0000 | OROMUCOSAL | Status: DC | PRN

## 2020-09-11 MED ORDER — BISACODYL 10 MG RE SUPP: 10.0000 mg | Freq: Every day | RECTAL | Status: DC | PRN

## 2020-09-11 MED ORDER — EZETIMIBE 10 MG PO TABS
10.0000 mg | ORAL_TABLET | Freq: Every day | ORAL | Status: DC
  Administered 2020-09-12 – 2020-09-15 (×4): 10 mg via ORAL
  Filled 2020-09-11 (×4): qty 1

## 2020-09-11 MED ORDER — SODIUM CHLORIDE (PF) 0.9 % IJ SOLN
INTRAMUSCULAR | Status: DC | PRN
  Administered 2020-09-11: 60 mL

## 2020-09-11 MED ORDER — FENTANYL CITRATE (PF) 100 MCG/2ML IJ SOLN
50.0000 ug | INTRAMUSCULAR | Status: DC
  Administered 2020-09-11: 100 ug via INTRAVENOUS
  Filled 2020-09-11: qty 2

## 2020-09-11 MED ORDER — PROPOFOL 10 MG/ML IV BOLUS
INTRAVENOUS | Status: DC | PRN
  Administered 2020-09-11: 20 mg via INTRAVENOUS
  Administered 2020-09-11: 120 mg via INTRAVENOUS
  Administered 2020-09-11 (×2): 20 mg via INTRAVENOUS

## 2020-09-11 MED ORDER — ONDANSETRON HCL 4 MG/2ML IJ SOLN: 4.0000 mg | Freq: Four times a day (QID) | INTRAMUSCULAR | Status: DC | PRN

## 2020-09-11 MED ORDER — ACETAMINOPHEN 10 MG/ML IV SOLN: 1000.0000 mg | Freq: Four times a day (QID) | INTRAVENOUS | Status: DC

## 2020-09-11 SURGICAL SUPPLY — 52 items
BAG SPEC THK2 15X12 ZIP CLS (MISCELLANEOUS) ×1
BAG ZIPLOCK 12X15 (MISCELLANEOUS) ×2 IMPLANT
BLADE SAG 18X100X1.27 (BLADE) ×2 IMPLANT
BLADE SAW SGTL 11.0X1.19X90.0M (BLADE) ×2 IMPLANT
BNDG ELASTIC 6X5.8 VLCR STR LF (GAUZE/BANDAGES/DRESSINGS) ×2 IMPLANT
BOWL SMART MIX CTS (DISPOSABLE) ×2 IMPLANT
CEMENT HV SMART SET (Cement) ×4 IMPLANT
CEMENT TIBIA MBT (Knees) IMPLANT
CLSR STERI-STRIP ANTIMIC 1/2X4 (GAUZE/BANDAGES/DRESSINGS) ×1 IMPLANT
COVER SURGICAL LIGHT HANDLE (MISCELLANEOUS) ×2 IMPLANT
COVER WAND RF STERILE (DRAPES) IMPLANT
CUFF TOURN SGL QUICK 34 (TOURNIQUET CUFF) ×2
CUFF TRNQT CYL 34X4.125X (TOURNIQUET CUFF) ×1 IMPLANT
DECANTER SPIKE VIAL GLASS SM (MISCELLANEOUS) ×2 IMPLANT
DRAPE U-SHAPE 47X51 STRL (DRAPES) ×2 IMPLANT
DRESSING AQUACEL AG SP 3.5X10 (GAUZE/BANDAGES/DRESSINGS) IMPLANT
DRSG AQUACEL AG ADV 3.5X10 (GAUZE/BANDAGES/DRESSINGS) ×2 IMPLANT
DRSG AQUACEL AG SP 3.5X10 (GAUZE/BANDAGES/DRESSINGS) ×2
DURAPREP 26ML APPLICATOR (WOUND CARE) ×2 IMPLANT
ELECT REM PT RETURN 15FT ADLT (MISCELLANEOUS) ×2 IMPLANT
FEMUR SIGMA PS SZ 3.0 R (Femur) ×1 IMPLANT
GLOVE SRG 8 PF TXTR STRL LF DI (GLOVE) ×1 IMPLANT
GLOVE SURG ENC MOIS LTX SZ8 (GLOVE) ×4 IMPLANT
GLOVE SURG UNDER POLY LF SZ8 (GLOVE) ×2
GLOVE SURG UNDER POLY LF SZ8.5 (GLOVE) ×2 IMPLANT
GOWN STRL REUS W/TWL LRG LVL3 (GOWN DISPOSABLE) ×4 IMPLANT
GOWN STRL REUS W/TWL XL LVL3 (GOWN DISPOSABLE) ×2 IMPLANT
HANDPIECE INTERPULSE COAX TIP (DISPOSABLE) ×2
HOLDER FOLEY CATH W/STRAP (MISCELLANEOUS) IMPLANT
IMMOBILIZER KNEE 20 (SOFTGOODS) ×2
IMMOBILIZER KNEE 20 THIGH 36 (SOFTGOODS) ×1 IMPLANT
INSERT TIBIAL PFC SIG SZ3 10MM (Knees) ×1 IMPLANT
KIT TURNOVER KIT A (KITS) ×2 IMPLANT
MANIFOLD NEPTUNE II (INSTRUMENTS) ×2 IMPLANT
NS IRRIG 1000ML POUR BTL (IV SOLUTION) ×2 IMPLANT
PACK TOTAL KNEE CUSTOM (KITS) ×2 IMPLANT
PADDING CAST COTTON 6X4 STRL (CAST SUPPLIES) ×3 IMPLANT
PATELLA DOME PFC 35MM (Knees) ×1 IMPLANT
PENCIL SMOKE EVACUATOR (MISCELLANEOUS) ×2 IMPLANT
PIN STEINMAN FIXATION KNEE (PIN) ×1 IMPLANT
PROTECTOR NERVE ULNAR (MISCELLANEOUS) ×2 IMPLANT
SET HNDPC FAN SPRY TIP SCT (DISPOSABLE) ×1 IMPLANT
STRIP CLOSURE SKIN 1/2X4 (GAUZE/BANDAGES/DRESSINGS) ×4 IMPLANT
SUT MNCRL AB 4-0 PS2 18 (SUTURE) ×2 IMPLANT
SUT STRATAFIX 0 PDS 27 VIOLET (SUTURE) ×2
SUT VIC AB 2-0 CT1 27 (SUTURE) ×6
SUT VIC AB 2-0 CT1 TAPERPNT 27 (SUTURE) ×3 IMPLANT
SUTURE STRATFX 0 PDS 27 VIOLET (SUTURE) ×1 IMPLANT
TIBIA MBT CEMENT (Knees) ×2 IMPLANT
TRAY FOLEY MTR SLVR 16FR STAT (SET/KITS/TRAYS/PACK) ×2 IMPLANT
WATER STERILE IRR 1000ML POUR (IV SOLUTION) ×4 IMPLANT
WRAP KNEE MAXI GEL POST OP (GAUZE/BANDAGES/DRESSINGS) ×2 IMPLANT

## 2020-09-11 NOTE — Anesthesia Procedure Notes (Signed)
Procedure Name: LMA Insertion Date/Time: 09/11/2020 1:28 PM Performed by: Yolonda Kida, CRNA Pre-anesthesia Checklist: Patient identified, Emergency Drugs available, Patient being monitored and Suction available Patient Re-evaluated:Patient Re-evaluated prior to induction Oxygen Delivery Method: Circle system utilized Preoxygenation: Pre-oxygenation with 100% oxygen Induction Type: IV induction Ventilation: Mask ventilation without difficulty LMA: LMA with gastric port inserted LMA Size: 4.0 Number of attempts: 1 Placement Confirmation: positive ETCO2 and breath sounds checked- equal and bilateral Tube secured with: Tape Dental Injury: Teeth and Oropharynx as per pre-operative assessment

## 2020-09-11 NOTE — Discharge Instructions (Addendum)
 Frank Aluisio, MD Total Joint Specialist [facility] [place] [address] [place], [place] [address] [phone]  TOTAL KNEE REPLACEMENT POSTOPERATIVE DIRECTIONS    Knee Rehabilitation, Guidelines Following Surgery  Results after knee surgery are often greatly improved when you follow the exercise, range of motion and muscle strengthening exercises prescribed by your doctor. Safety measures are also important to protect the knee from further injury. If any of these exercises cause you to have increased pain or swelling in your knee joint, decrease the amount until you are comfortable again and slowly increase them. If you have problems or questions, call your caregiver or physical therapist for advice.   BLOOD CLOT PREVENTION . Take a 10 mg Xarelto once a day for three weeks following surgery. Then resume one 81 mg aspirin once a day. . You may resume your vitamins/supplements once you have discontinued the Xarelto. . Do not take any NSAIDs (Advil, Aleve, Ibuprofen, Meloxicam, etc.) until you have discontinued the Xarelto.   HOME CARE INSTRUCTIONS  . Remove items at home which could result in a fall. This includes throw rugs or furniture in walking pathways.  . ICE to the affected knee as much as tolerated. Icing helps control swelling. If the swelling is well controlled you will be more comfortable and rehab easier. Continue to use ice on the knee for pain and swelling from surgery. You may notice swelling that will progress down to the foot and ankle. This is normal after surgery. Elevate the leg when you are not up walking on it.    . Continue to use the breathing machine which will help keep your temperature down. It is common for your temperature to cycle up and down following surgery, especially at night when you are not up moving around and exerting yourself. The breathing machine keeps your lungs expanded and your temperature down. . Do not place pillow under  the operative knee, focus on keeping the knee straight while resting  DIET You may resume your previous home diet once you are discharged from the hospital.  DRESSING / WOUND CARE / SHOWERING . Keep your bulky bandage on for 2 days. On the third post-operative day you may remove the Ace bandage and gauze. There is a waterproof adhesive bandage on your skin which will stay in place until your first follow-up appointment. Once you remove this you will not need to place another bandage . You may begin showering 3 days following surgery, but do not submerge the incision under water.  ACTIVITY For the first 5 days, the key is rest and control of pain and swelling . Do your home exercises twice a day starting on post-operative day 3. On the days you go to physical therapy, just do the home exercises once that day. . You should rest, ice and elevate the leg for 50 minutes out of every hour. Get up and walk/stretch for 10 minutes per hour. After 5 days you can increase your activity slowly as tolerated. . Walk with your walker as instructed. Use the walker until you are comfortable transitioning to a cane. Walk with the cane in the opposite hand of the operative leg. You may discontinue the cane once you are comfortable and walking steadily. . Avoid periods of inactivity such as sitting longer than an hour when not asleep. This helps prevent blood clots.  . You may discontinue the knee immobilizer once you are able to perform a straight leg raise while lying down. . You   may resume a sexual relationship in one month or when given the OK by your doctor.  . You may return to work once you are cleared by your doctor.  . Do not drive a car for 6 weeks or until released by your surgeon.  . Do not drive while taking narcotics.  TED HOSE STOCKINGS Wear the elastic stockings on both legs for three weeks following surgery during the day. You may remove them at night for sleeping.  WEIGHT BEARING Weight  bearing as tolerated with assist device (walker, cane, etc) as directed, use it as long as suggested by your surgeon or therapist, typically at least 4-6 weeks.  POSTOPERATIVE CONSTIPATION PROTOCOL Constipation - defined medically as fewer than three stools per week and severe constipation as less than one stool per week.  One of the most common issues patients have following surgery is constipation.  Even if you have a regular bowel pattern at home, your normal regimen is likely to be disrupted due to multiple reasons following surgery.  Combination of anesthesia, postoperative narcotics, change in appetite and fluid intake all can affect your bowels.  In order to avoid complications following surgery, here are some recommendations in order to help you during your recovery period.  . Colace (docusate) - Pick up an over-the-counter form of Colace or another stool softener and take twice a day as long as you are requiring postoperative pain medications.  Take with a full glass of water daily.  If you experience loose stools or diarrhea, hold the colace until you stool forms back up. If your symptoms do not get better within 1 week or if they get worse, check with your doctor. . Dulcolax (bisacodyl) - Pick up over-the-counter and take as directed by the product packaging as needed to assist with the movement of your bowels.  Take with a full glass of water.  Use this product as needed if not relieved by Colace only.  . MiraLax (polyethylene glycol) - Pick up over-the-counter to have on hand. MiraLax is a solution that will increase the amount of water in your bowels to assist with bowel movements.  Take as directed and can mix with a glass of water, juice, soda, coffee, or tea. Take if you go more than two days without a movement. Do not use MiraLax more than once per day. Call your doctor if you are still constipated or irregular after using this medication for 7 days in a row.  If you continue to have  problems with postoperative constipation, please contact the office for further assistance and recommendations.  If you experience "the worst abdominal pain ever" or develop nausea or vomiting, please contact the office immediatly for further recommendations for treatment.  ITCHING If you experience itching with your medications, try taking only a single pain pill, or even half a pain pill at a time.  You can also use Benadryl over the counter for itching or also to help with sleep.   MEDICATIONS See your medication summary on the "After Visit Summary" that the nursing staff will review with you prior to discharge.  You may have some home medications which will be placed on hold until you complete the course of blood thinner medication.  It is important for you to complete the blood thinner medication as prescribed by your surgeon.  Continue your approved medications as instructed at time of discharge.  PRECAUTIONS . If you experience chest pain or shortness of breath - call 911 immediately for transfer   to the hospital emergency department.  . If you develop a fever greater that 101 F, purulent drainage from wound, increased redness or drainage from wound, foul odor from the wound/dressing, or calf pain - CONTACT YOUR SURGEON.                                                   FOLLOW-UP APPOINTMENTS Make sure you keep all of your appointments after your operation with your surgeon and caregivers. You should call the office at the above phone number and make an appointment for approximately two weeks after the date of your surgery or on the date instructed by your surgeon outlined in the "After Visit Summary".  RANGE OF MOTION AND STRENGTHENING EXERCISES  Rehabilitation of the knee is important following a knee injury or an operation. After just a few days of immobilization, the muscles of the thigh which control the knee become weakened and shrink (atrophy). Knee exercises are designed to build up the  tone and strength of the thigh muscles and to improve knee motion. Often times heat used for twenty to thirty minutes before working out will loosen up your tissues and help with improving the range of motion but do not use heat for the first two weeks following surgery. These exercises can be done on a training (exercise) mat, on the floor, on a table or on a bed. Use what ever works the best and is most comfortable for you Knee exercises include:  . Leg Lifts - While your knee is still immobilized in a splint or cast, you can do straight leg raises. Lift the leg to 60 degrees, hold for 3 sec, and slowly lower the leg. Repeat 10-20 times 2-3 times daily. Perform this exercise against resistance later as your knee gets better.  Javier Docker and Hamstring Sets - Tighten up the muscle on the front of the thigh (Quad) and hold for 5-10 sec. Repeat this 10-20 times hourly. Hamstring sets are done by pushing the foot backward against an object and holding for 5-10 sec. Repeat as with quad sets.   Leg Slides: Lying on your back, slowly slide your foot toward your buttocks, bending your knee up off the floor (only go as far as is comfortable). Then slowly slide your foot back down until your leg is flat on the floor again.  Angel Wings: Lying on your back spread your legs to the side as far apart as you can without causing discomfort.  A rehabilitation program following serious knee injuries can speed recovery and prevent re-injury in the future due to weakened muscles. Contact your doctor or a physical therapist for more information on knee rehabilitation.   POST-OPERATIVE OPIOID TAPER INSTRUCTIONS: . It is important to wean off of your opioid medication as soon as possible. If you do not need pain medication after your surgery it is ok to stop day one. Marland Kitchen Opioids include: o Codeine, Hydrocodone(Norco, Vicodin), Oxycodone(Percocet, oxycontin) and hydromorphone amongst others.  . Long term and even short term use of  opiods can cause: o Increased pain response o Dependence o Constipation o Depression o Respiratory depression o And more.  . Withdrawal symptoms can include o Flu like symptoms o Nausea, vomiting o And more . Techniques to manage these symptoms o Hydrate well o Eat regular healthy meals o Stay active o Use  relaxation techniques(deep breathing, meditating, yoga) . Do Not substitute Alcohol to help with tapering . If you have been on opioids for less than two weeks and do not have pain than it is ok to stop all together.  . Plan to wean off of opioids o This plan should start within one week post op of your joint replacement. o Maintain the same interval or time between taking each dose and first decrease the dose.  o Cut the total daily intake of opioids by one tablet each day o Next start to increase the time between doses. o The last dose that should be eliminated is the evening dose.     IF YOU ARE TRANSFERRED TO A SKILLED REHAB FACILITY If the patient is transferred to a skilled rehab facility following release from the hospital, a list of the current medications will be sent to the facility for the patient to continue.  When discharged from the skilled rehab facility, please have the facility set up the patient's Home Health Physical Therapy prior to being released. Also, the skilled facility will be responsible for providing the patient with their medications at time of release from the facility to include their pain medication, the muscle relaxants, and their blood thinner medication. If the patient is still at the rehab facility at time of the two week follow up appointment, the skilled rehab facility will also need to assist the patient in arranging follow up appointment in our office and any transportation needs.  MAKE SURE YOU:  . Understand these instructions.  . Get help right away if you are not doing well or get worse.   DENTAL ANTIBIOTICS:  In most cases  prophylactic antibiotics for Dental procdeures after total joint surgery are not necessary.  Exceptions are as follows:  1. History of prior total joint infection  2. Severely immunocompromised (Organ Transplant, cancer chemotherapy, Rheumatoid biologic meds such as Humera)  3. Poorly controlled diabetes (A1C &gt; 8.0, blood glucose over 200)  If you have one of these conditions, contact your surgeon for an antibiotic prescription, prior to your dental procedure.    Pick up stool softner and laxative for home use following surgery while on pain medications. Do not submerge incision under water. Please use good hand washing techniques while changing dressing each day. May shower starting three days after surgery. Please use a clean towel to pat the incision dry following showers. Continue to use ice for pain and swelling after surgery. Do not use any lotions or creams on the incision until instructed by your surgeon.  Information on my medicine - XARELTO (Rivaroxaban)  This medication education was reviewed with me or my healthcare representative as part of my discharge preparation.  The pharmacist that spoke with me during my hospital stay was:    Why was Xarelto prescribed for you? Xarelto was prescribed for you to reduce the risk of blood clots forming after orthopedic surgery. The medical term for these abnormal blood clots is venous thromboembolism (VTE).  What do you need to know about xarelto ? Take your Xarelto ONCE DAILY at the same time every day. You may take it either with or without food.  If you have difficulty swallowing the tablet whole, you may crush it and mix in applesauce just prior to taking your dose.  Take Xarelto exactly as prescribed by your doctor and DO NOT stop taking Xarelto without talking to the doctor who prescribed the medication.  Stopping without other VTE prevention medication to take  the place of Xarelto may increase your risk of  developing a clot.  After discharge, you should have regular check-up appointments with your healthcare provider that is prescribing your Xarelto.    What do you do if you miss a dose? If you miss a dose, take it as soon as you remember on the same day then continue your regularly scheduled once daily regimen the next day. Do not take two doses of Xarelto on the same day.   Important Safety Information A possible side effect of Xarelto is bleeding. You should call your healthcare provider right away if you experience any of the following: ? Bleeding from an injury or your nose that does not stop. ? Unusual colored urine (red or dark brown) or unusual colored stools (red or black). ? Unusual bruising for unknown reasons. ? A serious fall or if you hit your head (even if there is no bleeding).  Some medicines may interact with Xarelto and might increase your risk of bleeding while on Xarelto. To help avoid this, consult your healthcare provider or pharmacist prior to using any new prescription or non-prescription medications, including herbals, vitamins, non-steroidal anti-inflammatory drugs (NSAIDs) and supplements.  This website has more information on Xarelto: VisitDestination.com.br.

## 2020-09-11 NOTE — Interval H&P Note (Signed)
History and Physical Interval Note:  09/11/2020 9:43 AM  Cheryl Hinton  has presented today for surgery, with the diagnosis of right knee osteoarthritis.  The various methods of treatment have been discussed with the patient and family. After consideration of risks, benefits and other options for treatment, the patient has consented to  Procedure(s) with comments: TOTAL KNEE ARTHROPLASTY (Right) - as a surgical intervention.  The patient's history has been reviewed, patient examined, no change in status, stable for surgery.  I have reviewed the patient's chart and labs.  Questions were answered to the patient's satisfaction.     Homero Fellers Lyndon Chenoweth

## 2020-09-11 NOTE — Anesthesia Procedure Notes (Signed)
Spinal  Patient location during procedure: OR Start time: 09/11/2020 12:53 PM End time: 09/11/2020 12:56 PM Reason for block: surgical anesthesia Staffing Performed: resident/CRNA  Anesthesiologist: Jairo Ben, MD Resident/CRNA: Yolonda Kida, CRNA Preanesthetic Checklist Completed: patient identified, IV checked, site marked, risks and benefits discussed, surgical consent, monitors and equipment checked, pre-op evaluation and timeout performed Spinal Block Patient position: sitting Prep: DuraPrep Patient monitoring: heart rate, continuous pulse ox and blood pressure Approach: midline Location: L3-4 Injection technique: single-shot Needle Needle type: Pencan  Needle gauge: 24 G Assessment Sensory level: T6 Events: CSF return

## 2020-09-11 NOTE — Care Plan (Signed)
Ortho Bundle Case Management Note  Patient Details  Name: [name] MRN: [id number] Date of Birth: 1935/11/26                  R TKA on 09/11/20. DCP: Home with friends. Apt with 0 steps. DME: RW ordered through [employer]. Has raised handicap toilets. PT: [facility] 4/22   DME Arranged:  Dan Humphreys rolling DME Agency:  [employer]  HH Arranged:    HH Agency:     Additional Comments: Please contact me with any questions of if this plan should need to change.  Ennis Forts, RN,CCM [facility]  [phone] 09/11/2020, 2:38 PM

## 2020-09-11 NOTE — Anesthesia Postprocedure Evaluation (Signed)
Anesthesia Post Note  Patient: Cheryl Hinton  Procedure(s) Performed: TOTAL KNEE ARTHROPLASTY (Right Knee)     Patient location during evaluation: PACU Anesthesia Type: General Level of consciousness: patient cooperative, oriented and sedated Pain management: pain level controlled (pt able to rest and sleep now) Vital Signs Assessment: post-procedure vital signs reviewed and stable Respiratory status: spontaneous breathing, nonlabored ventilation and respiratory function stable Cardiovascular status: blood pressure returned to baseline and stable Postop Assessment: no apparent nausea or vomiting Anesthetic complications: no   No complications documented.  Last Vitals:  Vitals:   09/11/20 1630 09/11/20 1645  BP: (!) 218/89 (!) 173/90  Pulse: 66 (!) 59  Resp: 12 14  Temp:    SpO2: 100% 100%    Last Pain:  Vitals:   09/11/20 1630  TempSrc:   PainSc: Asleep                 Jaylynne Birkhead,E. Richard Ritchey

## 2020-09-11 NOTE — Transfer of Care (Signed)
Immediate Anesthesia Transfer of Care Note  Patient: Cheryl Hinton  Procedure(s) Performed: TOTAL KNEE ARTHROPLASTY (Right Knee)  Patient Location: PACU  Anesthesia Type:GA combined with regional for post-op pain  Level of Consciousness: awake, drowsy and patient cooperative  Airway & Oxygen Therapy: Patient Spontanous Breathing and Patient connected to face mask oxygen  Post-op Assessment: Report given to RN and Post -op Vital signs reviewed and stable  Post vital signs: Reviewed and stable  Last Vitals:  Vitals Value Taken Time  BP 190/82 09/11/20 1432  Temp    Pulse 57 09/11/20 1436  Resp 12 09/11/20 1436  SpO2 100 % 09/11/20 1436  Vitals shown include unvalidated device data.  Last Pain:  Vitals:   09/11/20 1213  TempSrc:   PainSc: 4       Patients Stated Pain Goal: 4 (09/11/20 1213)  Complications: No complications documented.

## 2020-09-11 NOTE — Op Note (Signed)
OPERATIVE REPORT-TOTAL KNEE ARTHROPLASTY   Pre-operative diagnosis- Osteoarthritis  Right knee(s)  Post-operative diagnosis- Osteoarthritis Right knee(s)  Procedure-  Right  Total Knee Arthroplasty  Surgeon- Cheryl Rankin. Cheryl Heideman, MD  Assistant- Leilani Able, PA-C   Anesthesia-  Adductor canal block and general  EBL- 25 ml   Drains None  Tourniquet time- 35 minutes @ 300 mm Hg  Complications- None  Condition-PACU - hemodynamically stable.   Brief Clinical Note  Cheryl Hinton is a 85 y.o. year old female with end stage OA of her right knee with progressively worsening pain and dysfunction. She has constant pain, with activity and at rest and significant functional deficits with difficulties even with ADLs. She has had extensive non-op management including analgesics, injections of cortisone and viscosupplements, and home exercise program, but remains in significant pain with significant dysfunction.Radiographs show bone on bone arthritis medial and patellofemoral. She presents now for right Total Knee Arthroplasty.    Procedure in detail---   The patient is brought into the operating room and positioned supine on the operating table. After successful administration of  GA combined with regional for post-op pain,   a tourniquet is placed high on the  Right thigh(s) and the lower extremity is prepped and draped in the usual sterile fashion. Time out is performed by the operating team and then the  Right lower extremity is wrapped in Esmarch, knee flexed and the tourniquet inflated to 300 mmHg.       A midline incision is made with a ten blade through the subcutaneous tissue to the level of the extensor mechanism. A fresh blade is used to make a medial parapatellar arthrotomy. Soft tissue over the proximal medial tibia is subperiosteally elevated to the joint line with a knife and into the semimembranosus bursa with a Cobb elevator. Soft tissue over the proximal lateral tibia is elevated  with attention being paid to avoiding the patellar tendon on the tibial tubercle. The patella is everted, knee flexed 90 degrees and the ACL and PCL are removed. Findings are bone on bone medial and patellofemoral with large global osteophytes.        The drill is used to create a starting hole in the distal femur and the canal is thoroughly irrigated with sterile saline to remove the fatty contents. The 5 degree Right  valgus alignment guide is placed into the femoral canal and the distal femoral cutting block is pinned to remove 10 mm off the distal femur. Resection is made with an oscillating saw.      The tibia is subluxed forward and the menisci are removed. The extramedullary alignment guide is placed referencing proximally at the medial aspect of the tibial tubercle and distally along the second metatarsal axis and tibial crest. The block is pinned to remove 14mm off the more deficient medial  side. Resection is made with an oscillating saw. Size 3is the most appropriate size for the tibia and the proximal tibia is prepared with the modular drill and keel punch for that size.      The femoral sizing guide is placed and size 3 is most appropriate. Rotation is marked off the epicondylar axis and confirmed by creating a rectangular flexion gap at 90 degrees. The size 3 cutting block is pinned in this rotation and the anterior, posterior and chamfer cuts are made with the oscillating saw. The intercondylar block is then placed and that cut is made.      Trial size 3 tibial component, trial size  3 posterior stabilized femur and a 10  mm posterior stabilized rotating platform insert trial is placed. Full extension is achieved with excellent varus/valgus and anterior/posterior balance throughout full range of motion. The patella is everted and thickness measured to be 24  mm. Free hand resection is taken to 14 mm, a 35 template is placed, lug holes are drilled, trial patella is placed, and it tracks normally.  Osteophytes are removed off the posterior femur with the trial in place. All trials are removed and the cut bone surfaces prepared with pulsatile lavage. Cement is mixed and once ready for implantation, the size 3 tibial implant, size  3 posterior stabilized femoral component, and the size 35 patella are cemented in place and the patella is held with the clamp. The trial insert is placed and the knee held in full extension. The Exparel (20 ml mixed with 60 ml saline) is injected into the extensor mechanism, posterior capsule, medial and lateral gutters and subcutaneous tissues.  All extruded cement is removed and once the cement is hard the permanent 10 mm posterior stabilized rotating platform insert is placed into the tibial tray.      The wound is copiously irrigated with saline solution and the extensor mechanism closed with # 0 Stratofix suture. The tourniquet is released for a total tourniquet time of 35  minutes. Flexion against gravity is 140 degrees and the patella tracks normally. Subcutaneous tissue is closed with 2.0 vicryl and subcuticular with running 4.0 Monocryl. The incision is cleaned and dried and steri-strips and a bulky sterile dressing are applied. The limb is placed into a knee immobilizer and the patient is awakened and transported to recovery in stable condition.      Please note that a surgical assistant was a medical necessity for this procedure in order to perform it in a safe and expeditious manner. Surgical assistant was necessary to retract the ligaments and vital neurovascular structures to prevent injury to them and also necessary for proper positioning of the limb to allow for anatomic placement of the prosthesis.   Cheryl Rankin Buffy Ehler, MD    09/11/2020, 2:07 PM

## 2020-09-11 NOTE — Anesthesia Procedure Notes (Signed)
Anesthesia Regional Block: Adductor canal block   Pre-Anesthetic Checklist: ,, timeout performed, Correct Patient, Correct Site, Correct Laterality, Correct Procedure, Correct Position, site marked, Risks and benefits discussed,  Surgical consent,  Pre-op evaluation,  At surgeon's request and post-op pain management  Laterality: Right and Lower  Prep: chloraprep       Needles:  Injection technique: Single-shot  Needle Type: Echogenic Needle     Needle Length: 9cm  Needle Gauge: 21     Additional Needles:   Procedures:,,,, ultrasound used (permanent image in chart),,,,  Narrative:  Start time: 09/11/2020 12:06 PM End time: 09/11/2020 12:12 PM Injection made incrementally with aspirations every 5 mL.  Performed by: Personally  Anesthesiologist: Jairo Ben, MD  Additional Notes: Pt identified in Holding room.  Monitors applied. Working IV access confirmed. Sterile prep R thigh.  #21ga ECHOgenic Arrow block needle into adductor canal with US guidance.  20cc 0.75% Ropivacaine injected incrementally after negative test dose.  Patient asymptomatic, VSS, no heme aspirated, tolerated well.  Sandford Craze, MD

## 2020-09-11 NOTE — Anesthesia Preprocedure Evaluation (Addendum)
Anesthesia Evaluation  Patient identified by MRN, date of birth, ID band Patient awake    Reviewed: Allergy & Precautions, NPO status , Patient's Chart, lab work & pertinent test results, reviewed documented beta blocker date and time   History of Anesthesia Complications Negative for: history of anesthetic complications  Airway Mallampati: II  TM Distance: >3 FB Neck ROM: Full    Dental  (+) Dental Advisory Given   Pulmonary former smoker,  09/11/2020 SARS coronavirus NEG   breath sounds clear to auscultation       Cardiovascular hypertension, Pt. on medications and Pt. on home beta blockers (-) angina Rhythm:Regular Rate:Normal  '13 ECHO: mild LVH. Systolic function was normal. EF 55- 60%. Wall motion was normal, no significant valvular abnormalities '13 Myoview: normal    Neuro/Psych Anxiety negative neurological ROS     GI/Hepatic Neg liver ROS, GERD  Medicated and Controlled,  Endo/Other  negative endocrine ROS  Renal/GU negative Renal ROS     Musculoskeletal   Abdominal   Peds  Hematology negative hematology ROS (+)   Anesthesia Other Findings   Reproductive/Obstetrics                            Anesthesia Physical Anesthesia Plan  ASA: II  Anesthesia Plan: Spinal   Post-op Pain Management:  Regional for Post-op pain   Induction:   PONV Risk Score and Plan: 2 and Ondansetron and Treatment may vary due to age or medical condition  Airway Management Planned: Natural Airway and Simple Face Mask  Additional Equipment: None  Intra-op Plan:   Post-operative Plan:   Informed Consent: I have reviewed the patients History and Physical, chart, labs and discussed the procedure including the risks, benefits and alternatives for the proposed anesthesia with the patient or authorized representative who has indicated his/her understanding and acceptance.     Dental advisory  given  Plan Discussed with: CRNA and Surgeon  Anesthesia Plan Comments: (Plan routine monitors, SAB with adductor canal block for post op analgesia)       Anesthesia Quick Evaluation

## 2020-09-12 DIAGNOSIS — M1710 Unilateral primary osteoarthritis, unspecified knee: Secondary | ICD-10-CM | POA: Diagnosis present

## 2020-09-12 LAB — BASIC METABOLIC PANEL
Anion gap: 5 (ref 5–15)
BUN: 9 mg/dL (ref 8–23)
CO2: 32 mmol/L (ref 22–32)
Calcium: 7.8 mg/dL — ABNORMAL LOW (ref 8.9–10.3)
Chloride: 103 mmol/L (ref 98–111)
Creatinine, Ser: 0.57 mg/dL (ref 0.44–1.00)
GFR, Estimated: >60 mL/min (ref >60)
Glucose, Bld: 128 mg/dL — ABNORMAL HIGH (ref 70–99)
Potassium: 3.1 mmol/L — ABNORMAL LOW (ref 3.5–5.1)
Sodium: 140 mmol/L (ref 135–145)

## 2020-09-12 LAB — CBC
HCT: 28.7 % — ABNORMAL LOW (ref 36.0–46.0)
Hemoglobin: 9.3 g/dL — ABNORMAL LOW (ref 12.0–15.0)
MCH: 31.2 pg (ref 26.0–34.0)
MCHC: 32.4 g/dL (ref 30.0–36.0)
MCV: 96.3 fL (ref 80.0–100.0)
Platelets: 159 10*3/uL (ref 150–400)
RBC: 2.98 MIL/uL — ABNORMAL LOW (ref 3.87–5.11)
RDW: 13.5 % (ref 11.5–15.5)
WBC: 7.6 10*3/uL (ref 4.0–10.5)
nRBC: 0 % (ref 0.0–0.2)

## 2020-09-12 MED ORDER — POTASSIUM CHLORIDE CRYS ER 20 MEQ PO TBCR
40.0000 meq | EXTENDED_RELEASE_TABLET | ORAL | Status: AC
  Administered 2020-09-12 (×2): 40 meq via ORAL
  Filled 2020-09-12 (×2): qty 2

## 2020-09-12 NOTE — Care Management CC44 (Signed)
Condition Code 44 Documentation Completed  Patient Details  Name: INIS BORNEMAN MRN: [id number] Date of Birth: February 26, 1936   Condition Code 44 given:  Yes Patient signature on Condition Code 44 notice:  Yes Documentation of 2 MD's agreement:  Yes Code 44 added to claim:  Yes    Torell Minder, LCSW 09/12/2020, 1:15 PM

## 2020-09-12 NOTE — TOC Initial Note (Signed)
Transition of Care [facility]) - Initial/Assessment Note    Patient Details  Name: Cheryl Hinton MRN: [id number] Date of Birth: 20-Dec-1935  Transition of Care [facility]) CM/SW Contact:    Lennart Pall, LCSW Phone Number: 09/12/2020, 3:31 PM  Clinical Narrative:                 Met with pt today to introduce self/ role.  Aware pt is enrolled in Ortho [facility].  Confirming with pt that she is currently a resident in an [place] apt at [facility] and her plan is to dc to ALF level of care with [facility] provide [facility] services.  With pt's agreement, have spoken with [name] at [facility] to confirm with her as well. She does confirm plan and notes they will need an FL2 and COVID test within 24 hours of admission - will alert team.  Continue to monitor for dc readiness.  Expected Discharge Plan: Assisted Living (@ [facility]) Barriers to Discharge: Continued Medical Work up   Patient Goals and CMS Choice Patient states their goals for this hospitalization and ongoing recovery are:: eventually return to her [place] apt      Expected Discharge Plan and Services Expected Discharge Plan: Assisted Living (@ [facility]) In-house Referral: Clinical Social Work     Living arrangements for the past 2 months: [place]                 DME Arranged: [place] DME Agency: [employer]                  Prior Living Arrangements/Services Living arrangements for the past 2 months: [place] Lives with:: Self Patient language and need for interpreter reviewed:: Yes Do you feel safe going back to the place where you live?: Yes      Need for Family Participation in Patient Care: No (Comment) Care giver support system in place?: Yes (comment)   Criminal Activity/Legal Involvement Pertinent to Current Situation/Hospitalization: No - Comment as needed  Activities of Daily Living Home Assistive Devices/Equipment: Blood pressure cuff,Cane  (specify quad or straight),Eyeglasses,Built-in shower seat,Grab bars in shower,Hand-held shower hose,Hearing aid ADL Screening (condition at time of admission) Patient's cognitive ability adequate to safely complete daily activities?: Yes Is the patient deaf or have difficulty hearing?: Yes Does the patient have difficulty seeing, even when wearing glasses/contacts?: No Does the patient have difficulty concentrating, remembering, or making decisions?: No Patient able to express need for assistance with ADLs?: Yes Does the patient have difficulty dressing or bathing?: No Independently performs ADLs?: Yes (appropriate for developmental age) Does the patient have difficulty walking or climbing stairs?: Yes Weakness of Legs: None Weakness of Arms/Hands: None  Permission Sought/Granted Permission sought to share information with : Chartered certified accountant granted to share information with : Yes, Verbal Permission Granted     Permission granted to share info w AGENCY: Admissions for ALF at [facility]        Emotional Assessment Appearance:: Appears stated age Attitude/Demeanor/Rapport: Guarded Affect (typically observed): Constricted Orientation: : Oriented to Self,Oriented to Place,Oriented to  Time,Oriented to Situation Alcohol / Substance Use: Not Applicable Psych Involvement: No (comment)  Admission diagnosis:  Primary osteoarthritis of right knee [M17.11] Primary osteoarthritis of knee [M17.10] Patient Active Problem List   Diagnosis Date Noted  . Primary osteoarthritis of knee 09/12/2020  . Primary osteoarthritis of right knee 09/11/2020  . Allergic contact dermatitis 08/21/2020  . Leg swelling  06/29/2019  . Upper airway cough syndrome 10/02/2017  . Shortness of breath 01/13/2012  . NAUSEA 05/25/2010  . BARRETTS ESOPHAGUS 01/10/2009  . CONSTIPATION 01/10/2009  . GERD 11/20/2007  . DYSPHAGIA 11/20/2007  . Hyperlipidemia 11/19/2007  . Essential  hypertension 11/19/2007  . ESOPHAGEAL STRICTURE 11/19/2007  . GASTRITIS 11/19/2007  . DIVERTICULOSIS, COLON 11/19/2007  . OA (osteoarthritis) of knee 11/19/2007   PCP:  Lawerance Cruel, MD Pharmacy:   [facility], [place] [address] Phone: [phone] Fax: [phone]     Social Determinants of Health (SDOH) Interventions    Readmission Risk Interventions Readmission Risk Prevention Plan 09/12/2020  Post Dischage Appt Complete  Medication Screening Complete  Transportation Screening Complete  Some recent data might be hidden

## 2020-09-12 NOTE — Progress Notes (Signed)
PT Cancellation Note  Patient Details Name: DELAYNE SANZO MRN: [id number] DOB: 1936-03-11   Cancelled Treatment:    Reason Eval/Treat Not Completed: Pain limiting ability to participate. Attempted 2nd PT session-pt declined participation this afternoon 2* pain issues. Will check back on tomorrow.    Faye Ramsay, PT Acute Rehabilitation  Office: [phone] Pager: [phone]

## 2020-09-12 NOTE — Progress Notes (Addendum)
Subjective: 1 Day Post-Op Procedure(s) (LRB): TOTAL KNEE ARTHROPLASTY (Right) Patient reports pain as moderate.   Patient seen in rounds by Dr. Lequita Halt. Patient is well, and has had no acute complaints or problems other than pain in the right knee. Patient reports she feels as though she was "hit by a train." Discussed with patient that due to her chronic Percocet 10-325 use, pain control will be harder to achieve. Currently on dilaudid 2-4 mg Q4 PO. Denies chest pain, SOB or calf pain. Foley catheter to be removed this AM. We will continue therapy today.   Objective: Vital signs in last 24 hours: Temp:  [97.5 F (36.4 C)-98.7 F (37.1 C)] 98.7 F (37.1 C) (04/19 0535) Pulse Rate:  [56-82] 75 (04/19 0535) Resp:  [8-20] 16 (04/19 0535) BP: (129-240)/(59-111) 184/72 (04/19 0535) SpO2:  [95 %-100 %] 99 % (04/19 0535) Weight:  [68.9 kg] 68.9 kg (04/18 1742)  Intake/Output from previous day:  Intake/Output Summary (Last 24 hours) at 09/12/2020 0726 Last data filed at 09/12/2020 0650 Gross per 24 hour  Intake 3132.77 ml  Output 1875 ml  Net 1257.77 ml     Intake/Output this shift: No intake/output data recorded.  Labs: Recent Labs    09/12/20 0302  HGB 9.3*   Recent Labs    09/12/20 0302  WBC 7.6  RBC 2.98*  HCT 28.7*  PLT 159   Recent Labs    09/12/20 0302  NA 140  K 3.1*  CL 103  CO2 32  BUN 9  CREATININE 0.57  GLUCOSE 128*  CALCIUM 7.8*   No results for input(s): LABPT, INR in the last 72 hours.  Exam: General - Patient is Alert and Oriented Extremity - Neurologically intact Neurovascular intact Sensation intact distally Dorsiflexion/Plantar flexion intact Dressing - dressing C/D/I Motor Function - intact, moving foot and toes well on exam.   Past Medical History:  Diagnosis Date  . Anemia    Prior to hysterectomy  . Anxiety   . Arthritis   . Barrett's esophagus   . Colon polyps 1985  . Diverticulosis   . Endometriosis   . Esophageal  stricture   . GERD (gastroesophageal reflux disease)   . Hx of migraine headaches   . Hyperlipidemia   . Hypertension   . IBS (irritable bowel syndrome)   . Pneumonia   . Status post dilation of esophageal narrowing   . Urticaria     Assessment/Plan: 1 Day Post-Op Procedure(s) (LRB): TOTAL KNEE ARTHROPLASTY (Right) Principal Problem:   OA (osteoarthritis) of knee Active Problems:   Primary osteoarthritis of right knee   Primary osteoarthritis of knee  Estimated body mass index is 26.58 kg/m as calculated from the following:   Height as of this encounter: 5' 3.39" (1.61 m).   Weight as of this encounter: 68.9 kg. Advance diet Up with therapy  Anticipated LOS equal to or greater than 2 midnights due to - Age 78 and older with one or more of the following:  - Obesity  - Expected need for hospital services (PT, OT, Nursing) required for safe  discharge  - Anticipated need for postoperative skilled nursing care or inpatient rehab  - Active co-morbidities: Chronic pain requiring opiods OR   - Unanticipated findings during/Post Surgery: None  - Patient is a high risk of re-admission due to: None    DVT Prophylaxis - Aspirin Weight bearing as tolerated. Continue therapy.  Potassium 3.1 this AM, two doses of 40 mEq KCl ordered. Blood pressure elevated,  will resume antihypertensives today.  Plan is to go Home after hospital stay. Will work with therapy today, possible discharge tomorrow pending progress.   Arther Abbott, PA-C Orthopedic Surgery [phone] 09/12/2020, 7:26 AM

## 2020-09-12 NOTE — Care Management Obs Status (Signed)
MEDICARE OBSERVATION STATUS NOTIFICATION   Patient Details  Name: Cheryl Hinton MRN: [id number] Date of Birth: 1935/08/12   Medicare Observation Status Notification Given:  Yes    HOYLEValentina Gu, LCSW 09/12/2020, 1:15 PM

## 2020-09-12 NOTE — NC FL2 (Signed)
[place] MEDICAID FL2 LEVEL OF CARE SCREENING TOOL     IDENTIFICATION  Patient Name: Cheryl Hinton Birthdate: 10/16/1935 Sex: female Admission Date (Current Location): 09/11/2020  [facility] and [place] Number:  Producer, television/film/video and Address:  [facility],  [address] [place] [address]      Provider Number: [id number]  Attending Physician Name and Address:  Ollen Gross, MD  Relative Name and Phone Number:       Current Level of Care: Hospital Recommended Level of Care: Assisted Living Facility Prior Approval Number:    Date Approved/Denied:   PASRR Number:    Discharge Plan: Domiciliary (Rest home) (ALF)    Current Diagnoses: Patient Active Problem List   Diagnosis Date Noted  . Primary osteoarthritis of knee 09/12/2020  . Primary osteoarthritis of right knee 09/11/2020  . Allergic contact dermatitis 08/21/2020  . Leg swelling 06/29/2019  . Upper airway cough syndrome 10/02/2017  . Shortness of breath 01/13/2012  . NAUSEA 05/25/2010  . BARRETTS ESOPHAGUS 01/10/2009  . CONSTIPATION 01/10/2009  . GERD 11/20/2007  . DYSPHAGIA 11/20/2007  . Hyperlipidemia 11/19/2007  . Essential hypertension 11/19/2007  . ESOPHAGEAL STRICTURE 11/19/2007  . GASTRITIS 11/19/2007  . DIVERTICULOSIS, COLON 11/19/2007  . OA (osteoarthritis) of knee 11/19/2007    Orientation RESPIRATION BLADDER Height & Weight     Self,Time,Situation,Place  Normal Continent Weight: 151 lb 14.4 oz (68.9 kg) Height:  5' 3.39" (161 cm)  BEHAVIORAL SYMPTOMS/MOOD NEUROLOGICAL BOWEL NUTRITION STATUS      Continent    AMBULATORY STATUS COMMUNICATION OF NEEDS Skin   Limited Assist Verbally Surgical wounds                       Personal Care Assistance Level of Assistance  Bathing,Dressing Bathing Assistance: Limited assistance   Dressing Assistance: Limited assistance     Functional Limitations Info             SPECIAL CARE FACTORS FREQUENCY  PT (By licensed PT)      PT Frequency: 2-3x/wk              Contractures Contractures Info: Not present    Additional Factors Info  Code Status,Allergies Code Status Info: Full Allergies Info: see MAR           Current Medications (09/12/2020):  This is the current hospital active medication list Current Facility-Administered Medications  Medication Dose Route Frequency Provider Last Rate Last Admin  . 0.9 %  sodium chloride infusion   Intravenous Continuous Edmisten, Kristie L, PA 75 mL/hr at 09/12/20 0200 Infusion Verify at 09/12/20 0200  . acetaminophen (TYLENOL) tablet 1,000 mg  1,000 mg Oral Q6H Edmisten, Kristie L, PA   1,000 mg at 09/12/20 0600  . ALPRAZolam (XANAX) tablet 0.25-0.5 mg  0.25-0.5 mg Oral QHS PRN Edmisten, Kristie L, PA      . bisacodyl (DULCOLAX) suppository 10 mg  10 mg Rectal Daily PRN Edmisten, Kristie L, PA      . diphenhydrAMINE (BENADRYL) 12.5 MG/5ML elixir 12.5-25 mg  12.5-25 mg Oral Q4H PRN Edmisten, Kristie L, PA      . docusate sodium (COLACE) capsule 100 mg  100 mg Oral BID Edmisten, Kristie L, PA   100 mg at 09/12/20 0954  . ezetimibe (ZETIA) tablet 10 mg  10 mg Oral Daily Edmisten, Kristie L, PA   10 mg at 09/12/20 0954  . irbesartan (AVAPRO) tablet 300 mg  300 mg Oral Daily Ollen Gross, MD  300 mg at 09/12/20 0954   And  . hydrochlorothiazide (HYDRODIURIL) tablet 25 mg  25 mg Oral Daily Ollen Gross, MD   25 mg at 09/12/20 0954  . HYDROmorphone (DILAUDID) tablet 2-4 mg  2-4 mg Oral Q4H PRN Edmisten, Kristie L, PA   4 mg at 09/12/20 1407  . hydrOXYzine (ATARAX/VISTARIL) tablet 50 mg  50 mg Oral QHS Edmisten, Kristie L, PA   50 mg at 09/11/20 2127  . menthol-cetylpyridinium (CEPACOL) lozenge 3 mg  1 lozenge Oral PRN Edmisten, Kristie L, PA       Or  . phenol (CHLORASEPTIC) mouth spray 1 spray  1 spray Mouth/Throat PRN Edmisten, Kristie L, PA      . methocarbamol (ROBAXIN) tablet 500 mg  500 mg Oral Q6H PRN Edmisten, Kristie L, PA   500 mg at 09/12/20 0600    Or  . methocarbamol (ROBAXIN) 500 mg in dextrose 5 % 50 mL IVPB  500 mg Intravenous Q6H PRN Edmisten, Kristie L, PA 100 mL/hr at 09/11/20 1440 500 mg at 09/11/20 1440  . metoCLOPramide (REGLAN) tablet 5-10 mg  5-10 mg Oral Q8H PRN Edmisten, Kristie L, PA       Or  . metoCLOPramide (REGLAN) injection 5-10 mg  5-10 mg Intravenous Q8H PRN Edmisten, Kristie L, PA      . morphine 2 MG/ML injection 0.5-1 mg  0.5-1 mg Intravenous Q2H PRN Edmisten, Kristie L, PA   1 mg at 09/12/20 1233  . nebivolol (BYSTOLIC) tablet 5 mg  5 mg Oral Daily Edmisten, Kristie L, PA   5 mg at 09/12/20 0955  . ondansetron (ZOFRAN) tablet 4 mg  4 mg Oral Q6H PRN Edmisten, Kristie L, PA       Or  . ondansetron (ZOFRAN) injection 4 mg  4 mg Intravenous Q6H PRN Edmisten, Kristie L, PA      . pantoprazole (PROTONIX) EC tablet 40 mg  40 mg Oral Daily Edmisten, Kristie L, PA   40 mg at 09/12/20 0954  . polyethylene glycol (MIRALAX / GLYCOLAX) packet 17 g  17 g Oral Daily PRN Edmisten, Kristie L, PA      . potassium chloride (KLOR-CON) CR tablet 20 mEq  20 mEq Oral Daily Edmisten, Kristie L, PA   20 mEq at 09/12/20 0954  . pregabalin (LYRICA) capsule 50 mg  50 mg Oral Daily Edmisten, Kristie L, PA   50 mg at 09/12/20 0954  . rivaroxaban (XARELTO) tablet 10 mg  10 mg Oral Q breakfast Edmisten, Kristie L, PA   10 mg at 09/12/20 0802  . sertraline (ZOLOFT) tablet 50 mg  50 mg Oral Daily Edmisten, Kristie L, PA   50 mg at 09/12/20 0955  . sodium phosphate (FLEET) 7-19 GM/118ML enema 1 enema  1 enema Rectal Once PRN Edmisten, Kristie L, PA      . sucralfate (CARAFATE) 1 GM/10ML suspension 1 g  1 g Oral TID PRN Edmisten, Kristie L, PA      . valACYclovir (VALTREX) tablet 500 mg  500 mg Oral Daily Edmisten, Kristie L, PA   500 mg at 09/12/20 6195     Discharge Medications: Please see discharge summary for a list of discharge medications.  Relevant Imaging Results:  Relevant Lab Results:   Additional Information    Hanson Medeiros,  LCSW

## 2020-09-12 NOTE — Progress Notes (Signed)
OT Cancellation Note  Patient Details Name: Cheryl Hinton MRN: [id number] DOB: Apr 21, 1936   Cancelled Treatment:    Reason Eval/Treat Not Completed: Pain limiting ability to participate. Patient declines therapy due to pain. Will f/u as able.  Shea Swalley L Mirriam Vadala 09/12/2020, 2:55 PM

## 2020-09-12 NOTE — Evaluation (Signed)
Physical Therapy Evaluation Patient Details Name: Cheryl Hinton MRN: [id number] DOB: Oct 18, 1935 Today's Date: 09/12/2020   History of Present Illness  85 yo female s/p R TKA 09/11/20. Hx of chronic pain  Clinical Impression  On eval, pt required Min assist for mobility. She walked ~15 feet. Ambulation distance and ROM exercises limited by pain. Mod-severe pain with activity. Will plan to follow and progress activity as tolerated. Pt lives in Ind Living @ [facility]. She stated she is planning to d/c to ALF @ [facility] temporarily after d/c.     Follow Up Recommendations Follow surgeon's recommendation for DC plan and follow-up therapies (per pt, plan is for ALF temporarily. May need to consider SNF depending on progress.)    Equipment Recommendations  None recommended by PT (pt stated she has a RW)    Recommendations for Other Services OT consult     Precautions / Restrictions Precautions Precautions: Fall;Knee Required Braces or Orthoses: Knee Immobilizer - Right Knee Immobilizer - Right: Discontinue once straight leg raise with < 10 degree lag Restrictions Weight Bearing Restrictions: No RLE Weight Bearing: Weight bearing as tolerated      Mobility  Bed Mobility Overal bed mobility: Needs Assistance Bed Mobility: Supine to Sit     Supine to sit: Min assist;HOB elevated     General bed mobility comments: A for L LE. Increased time. Cues for safety, technique.    Transfers Overall transfer level: Needs assistance Equipment used: Rolling walker (2 wheeled) Transfers: Sit to/from Stand Sit to Stand: Min assist;From elevated surface         General transfer comment: A to rise, steady, control descent. Cues for safety, technique, hand/LE placement, posture. Increased time.  Ambulation/Gait Ambulation/Gait assistance: Min assist Gait Distance (Feet): 15 Feet Assistive device: Rolling walker (2 wheeled) Gait Pattern/deviations: Step-to pattern;Trunk  flexed;Antalgic     General Gait Details: Cues for safety, technique, sequence, posture, step length. A to stabilize throughout distance. Distance limited by pain. Followed with recliner and used it to transport pt back to room.  Stairs            Wheelchair Mobility    Modified Rankin (Stroke Patients Only)       Balance Overall balance assessment: Needs assistance;History of Falls         Standing balance support: Bilateral upper extremity supported Standing balance-Leahy Scale: Poor                               Pertinent Vitals/Pain Pain Assessment: 0-10 Pain Score: 8  Pain Location: R knee/lower leg Pain Descriptors / Indicators: Grimacing;Discomfort;Sore;Aching Pain Intervention(s): Limited activity within patient's tolerance;Monitored during session;Ice applied;Repositioned    Home Living Family/patient expects to be discharged to:: Private residence Living Arrangements: Alone   Type of Home: Independent living facility ([facility]) Home Access: Level entry     Home Layout: One level Home Equipment: Environmental consultant - 2 wheels Additional Comments: Pt stated she is planning to d/c to ALF before returning to her independent living apt    Prior Function Level of Independence: Independent with assistive device(s)         Comments: using cane for ambulation.     Hand Dominance        Extremity/Trunk Assessment                Communication   Communication: HOH  Cognition Arousal/Alertness: Awake/alert Behavior During Therapy: WFL for tasks assessed/performed  Overall Cognitive Status: Within Functional Limits for tasks assessed                                        General Comments      Exercises Total Joint Exercises Ankle Circles/Pumps: AROM;Both;10 reps Quad Sets: AROM;Both;5 reps   Assessment/Plan    PT Assessment    PT Problem List         PT Treatment Interventions      PT Goals (Current goals  can be found in the Care Plan section)  Acute Rehab PT Goals Patient Stated Goal: less pain. PT Goal Formulation: With patient Time For Goal Achievement: 09/26/20 Potential to Achieve Goals: Good    Frequency 7X/week   Barriers to discharge        Co-evaluation               AM-PAC PT "6 Clicks" Mobility  Outcome Measure Help needed turning from your back to your side while in a flat bed without using bedrails?: A Little Help needed moving from lying on your back to sitting on the side of a flat bed without using bedrails?: A Little Help needed moving to and from a bed to a chair (including a wheelchair)?: A Little Help needed standing up from a chair using your arms (e.g., wheelchair or bedside chair)?: A Little Help needed to walk in hospital room?: A Little Help needed climbing 3-5 steps with a railing? : A Lot 6 Click Score: 17    End of Session Equipment Utilized During Treatment: Gait belt;Right knee immobilizer Activity Tolerance: Patient limited by fatigue;Patient limited by pain Patient left: in chair;with call bell/phone within reach;with chair alarm set   PT Visit Diagnosis: History of falling (Z91.81);Pain;Other abnormalities of gait and mobility (R26.89) Pain - Right/Left: Right Pain - part of body: Knee;Leg;Ankle and joints of foot    Time: 2248-2500 PT Time Calculation (min) (ACUTE ONLY): 41 min   Charges:   PT Evaluation $PT Eval Low Complexity: 1 Low PT Treatments $Gait Training: 8-22 mins $Therapeutic Exercise: 8-22 mins          Faye Ramsay, PT Acute Rehabilitation  Office: [phone] Pager: [phone]

## 2020-09-13 ENCOUNTER — Encounter (HOSPITAL_COMMUNITY): Payer: Self-pay | Admitting: Orthopedic Surgery

## 2020-09-13 LAB — BASIC METABOLIC PANEL
Anion gap: 10 (ref 5–15)
BUN: 9 mg/dL (ref 8–23)
CO2: 26 mmol/L (ref 22–32)
Calcium: 8.3 mg/dL — ABNORMAL LOW (ref 8.9–10.3)
Chloride: 100 mmol/L (ref 98–111)
Creatinine, Ser: 0.5 mg/dL (ref 0.44–1.00)
GFR, Estimated: >60 mL/min (ref >60)
Glucose, Bld: 111 mg/dL — ABNORMAL HIGH (ref 70–99)
Potassium: 3.4 mmol/L — ABNORMAL LOW (ref 3.5–5.1)
Sodium: 136 mmol/L (ref 135–145)

## 2020-09-13 LAB — URINALYSIS, ROUTINE W REFLEX MICROSCOPIC
Bilirubin Urine: NEGATIVE
Glucose, UA: NEGATIVE mg/dL
Ketones, ur: 80 mg/dL — AB
Nitrite: NEGATIVE
Protein, ur: 100 mg/dL — AB
Specific Gravity, Urine: 1.018 (ref 1.005–1.030)
pH: 5 (ref 5.0–8.0)

## 2020-09-13 LAB — CBC
HCT: 28.2 % — ABNORMAL LOW (ref 36.0–46.0)
Hemoglobin: 9.1 g/dL — ABNORMAL LOW (ref 12.0–15.0)
MCH: 30.6 pg (ref 26.0–34.0)
MCHC: 32.3 g/dL (ref 30.0–36.0)
MCV: 94.9 fL (ref 80.0–100.0)
Platelets: 176 10*3/uL (ref 150–400)
RBC: 2.97 MIL/uL — ABNORMAL LOW (ref 3.87–5.11)
RDW: 13.8 % (ref 11.5–15.5)
WBC: 9.1 10*3/uL (ref 4.0–10.5)
nRBC: 0 % (ref 0.0–0.2)

## 2020-09-13 LAB — SARS CORONAVIRUS 2 (TAT 6-24 HRS): SARS Coronavirus 2: NEGATIVE

## 2020-09-13 MED ORDER — RIVAROXABAN 10 MG PO TABS: 10.0000 mg | ORAL_TABLET | Freq: Every day | ORAL | 0 refills | Status: DC

## 2020-09-13 MED ORDER — OXYCODONE-ACETAMINOPHEN 5-325 MG PO TABS
1.0000 | ORAL_TABLET | ORAL | Status: DC | PRN
  Administered 2020-09-13 – 2020-09-15 (×9): 2 via ORAL
  Filled 2020-09-13 (×9): qty 2

## 2020-09-13 MED ORDER — HYDROMORPHONE HCL 2 MG PO TABS: 2.0000 mg | ORAL_TABLET | Freq: Four times a day (QID) | ORAL | 0 refills | Status: DC | PRN

## 2020-09-13 MED ORDER — METHOCARBAMOL 500 MG PO TABS: 500.0000 mg | ORAL_TABLET | Freq: Four times a day (QID) | ORAL | 0 refills | Status: DC | PRN

## 2020-09-13 NOTE — Progress Notes (Signed)
Physical Therapy Treatment Patient Details Name: Cheryl Hinton MRN: [id number] DOB: [birth year] Today's Date: 09/13/2020    History of Present Illness 85 yo female s/p R TKA 09/11/20. Hx of chronic pain    PT Comments    POD # 2 pm session Pt in recliner on 2 lts nasal at rest sats 96%.  Assisted to [facility] first.  General transfer comment: assisted from recliner to [facility] then from [facility] to attempt gait.  50% VC's on hand placement to avoid pulling up on walker as well as safety with turns. General Gait Details: distance limited by groggyness  50% VC's on safety with turns. Assisted back to bed.General bed mobility comments: required Max Assist back to bed to support B LE and scoot to [facility].  Performed a few TE's  but limited by grogginess.   Pt not progressing as expected and will need ST Rehab at SNF prior to safely returning home.  Plus her "other" knee has audible crepitus and instability.  "I need a knee replacement on that one too", stated pt.   Follow Up Recommendations  SNF     Equipment Recommendations  None recommended by PT    Recommendations for Other Services       Precautions / Restrictions Precautions Precautions: Fall;Knee Precaution Comments: instructed no pillow under knee Required Braces or Orthoses: Knee Immobilizer - Right Knee Immobilizer - Right: Discontinue once straight leg raise with < 10 degree lag Restrictions Weight Bearing Restrictions: No RLE Weight Bearing: Weight bearing as tolerated    Mobility  Bed Mobility Overal bed mobility: Needs Assistance Bed Mobility: Sit to Supine     Supine to sit: Min assist Sit to supine: Max assist   General bed mobility comments: required Max Assist back to bed to support B LE and scoot to [facility]    Transfers Overall transfer level: Needs assistance Equipment used: Rolling walker (2 wheeled) Transfers: Sit to/from [employer] Sit to Stand: Min assist;From elevated surface Stand pivot transfers:  Min assist;Mod assist       General transfer comment: assisted from recliner to [facility] then from [facility] to attempt gait.  50% VC's on hand placement to avoid pulling up on walker as well as safety with turns.  Ambulation/Gait Ambulation/Gait assistance: Min assist;Mod assist Gait Distance (Feet): 2 Feet Assistive device: Rolling walker (2 wheeled) Gait Pattern/deviations: Step-to pattern;Trunk flexed;Antalgic Gait velocity: decreased   General Gait Details: distance limited by groggyness  50% VC's on safety with turns. Assisted back to bed.   Stairs             Wheelchair Mobility    Modified Rankin (Stroke Patients Only)       Balance Overall balance assessment: Needs assistance Sitting-balance support: Feet supported Sitting balance-Leahy Scale: Good     Standing balance support: Bilateral upper extremity supported Standing balance-Leahy Scale: Poor Standing balance comment: reliant on walker                            Cognition Arousal/Alertness: Awake/alert Behavior During Therapy: WFL for tasks assessed/performed Overall Cognitive Status: Within Functional Limits for tasks assessed                                 General Comments: AxO x 3 very sleepy      Exercises      General Comments  Pertinent Vitals/Pain Pain Assessment: Faces Faces Pain Scale: Hurts little more Pain Location: R knee Pain Descriptors / Indicators: Grimacing;Discomfort;Sore;Aching Pain Intervention(s): Monitored during session;Premedicated before session;Repositioned;Ice applied    Home Living Family/patient expects to be discharged to:: Private residence Living Arrangements: Alone   Type of Home: Independent living facility ([facility]) Home Access: Level entry   Home Layout: One level Home Equipment: Environmental consultant - 2 wheels Additional Comments: Pt stated she is planning to d/c to ALF before returning to her independent living apt    Prior  Function Level of Independence: Independent with assistive device(s)      Comments: using cane for ambulation.   PT Goals (current goals can now be found in the care plan section) Acute Rehab PT Goals Patient Stated Goal: less pain. Progress towards PT goals: Progressing toward goals    Frequency    7X/week      PT Plan Current plan remains appropriate    Co-evaluation              AM-PAC PT "6 Clicks" Mobility   Outcome Measure  Help needed turning from your back to your side while in a flat bed without using bedrails?: A Little Help needed moving from lying on your back to sitting on the side of a flat bed without using bedrails?: A Little Help needed moving to and from a bed to a chair (including a wheelchair)?: A Little Help needed standing up from a chair using your arms (e.g., wheelchair or bedside chair)?: A Lot Help needed to walk in hospital room?: A Lot Help needed climbing 3-5 steps with a railing? : A Lot 6 Click Score: 15    End of Session Equipment Utilized During Treatment: Gait belt Activity Tolerance: Patient limited by fatigue Patient left: in bed;with call bell/phone within reach Nurse Communication: Need for lift equipment PT Visit Diagnosis: History of falling (Z91.81);Pain;Other abnormalities of gait and mobility (R26.89) Pain - Right/Left: Right Pain - part of body: Knee     Time: 0998-3382 PT Time Calculation (min) (ACUTE ONLY): 25 min  Charges:  $Gait Training: 8-22 mins $Therapeutic Activity: 8-22 mins                     Felecia Shelling  PTA Acute  Rehabilitation Services Pager      [phone] Office      [phone]

## 2020-09-13 NOTE — Progress Notes (Signed)
Orthopedic Tech Progress Note Patient Details:  EMMALEA TREANOR 17-Jan-1936 [id number]  Patient ID: Abbott Pao, female   DOB: 1935/12/28, 85 y.o.   MRN: [id number]   Saul Fordyce 09/13/2020, 7:34 PMPicked up CPM

## 2020-09-13 NOTE — Progress Notes (Signed)
Physical Therapy Treatment Patient Details Name: Cheryl Hinton MRN: [id number] DOB: 31-Jan-1936 Today's Date: 09/13/2020    History of Present Illness 85 yo female s/p R TKA 09/11/20. Hx of chronic pain    PT Comments    POD # 2 am session Pt sitting EOB on arrival with OT.  Assisted to [facility] required increased time and VC's safety with turns.  Extended time seated on BSC due to "burning" during urination.  RN aware and needing a urine sample.  Assisted off BSC to attempt ambulation however distance was limited by pain level 10/10.  Positioned in recliner to comfort and applied ice.  Yesterday, pt amb in hallway 15 feet.  Will attempt to see again this afternoon.    Follow Up Recommendations  SNF     Equipment Recommendations       Recommendations for Other Services       Precautions / Restrictions Precautions Precautions: Fall;Knee Precaution Comments: instructed no pillow under knee Required Braces or Orthoses: Knee Immobilizer - Right Knee Immobilizer - Right: Discontinue once straight leg raise with < 10 degree lag Restrictions Weight Bearing Restrictions: No RLE Weight Bearing: Weight bearing as tolerated    Mobility  Bed Mobility Overal bed mobility: Needs Assistance Bed Mobility: Supine to Sit     Supine to sit: Min assist     General bed mobility comments: pt seated EOB on arrival with OT    Transfers Overall transfer level: Needs assistance Equipment used: Rolling walker (2 wheeled) Transfers: Sit to/from [employer] Sit to Stand: Min assist;From elevated surface Stand pivot transfers: Min assist;Mod assist       General transfer comment: A to rise, steady, control descent. Cues for safety, technique, hand/LE placement, posture. Increased time on/off BSC.  Ambulation/Gait Ambulation/Gait assistance: Min assist Gait Distance (Feet): 3 Feet Assistive device: Rolling walker (2 wheeled) Gait Pattern/deviations: Step-to pattern;Trunk  flexed;Antalgic Gait velocity: decreased   General Gait Details: distance limited by pain.  50% VC's on safety with turns.   Stairs             Wheelchair Mobility    Modified Rankin (Stroke Patients Only)       Balance Overall balance assessment: Needs assistance Sitting-balance support: Feet supported Sitting balance-Leahy Scale: Good     Standing balance support: Bilateral upper extremity supported Standing balance-Leahy Scale: Poor Standing balance comment: reliant on walker                            Cognition Arousal/Alertness: Awake/alert Behavior During Therapy: WFL for tasks assessed/performed Overall Cognitive Status: Within Functional Limits for tasks assessed                                 General Comments: AxO 3 but distarted by pain      Exercises      General Comments        Pertinent Vitals/Pain Pain Assessment: Faces Faces Pain Scale: Hurts whole lot Pain Location: R knee Pain Descriptors / Indicators: Grimacing;Discomfort;Sore;Aching Pain Intervention(s): Monitored during session;Repositioned;Premedicated before session;Ice applied    Home Living Family/patient expects to be discharged to:: Private residence Living Arrangements: Alone   Type of Home: Independent living facility ([facility]) Home Access: Level entry   Home Layout: One level Home Equipment: Walker - 2 wheels Additional Comments: Pt stated she is planning to d/c to ALF before returning  to her independent living apt    Prior Function Level of Independence: Independent with assistive device(s)      Comments: using cane for ambulation.   PT Goals (current goals can now be found in the care plan section) Acute Rehab PT Goals Patient Stated Goal: less pain. Progress towards PT goals: Progressing toward goals    Frequency    7X/week      PT Plan Current plan remains appropriate    Co-evaluation              AM-PAC PT "6  Clicks" Mobility   Outcome Measure  Help needed turning from your back to your side while in a flat bed without using bedrails?: A Little Help needed moving from lying on your back to sitting on the side of a flat bed without using bedrails?: A Little Help needed moving to and from a bed to a chair (including a wheelchair)?: A Little Help needed standing up from a chair using your arms (e.g., wheelchair or bedside chair)?: A Lot Help needed to walk in hospital room?: A Lot Help needed climbing 3-5 steps with a railing? : A Lot 6 Click Score: 15    End of Session Equipment Utilized During Treatment: Gait belt Activity Tolerance: Patient limited by fatigue;Patient limited by pain Patient left: in chair;with call bell/phone within reach;with chair alarm set Nurse Communication: Need for lift equipment PT Visit Diagnosis: History of falling (Z91.81);Pain;Other abnormalities of gait and mobility (R26.89) Pain - Right/Left: Right Pain - part of body: Knee     Time: 1100-1125 PT Time Calculation (min) (ACUTE ONLY): 25 min  Charges:  $Gait Training: 8-22 mins $Therapeutic Activity: 8-22 mins                     Felecia Shelling  PTA Acute  Rehabilitation Services Pager      [phone] Office      [phone]

## 2020-09-13 NOTE — TOC Progression Note (Signed)
Transition of Care [facility]) - Progression Note    Patient Details  Name: Cheryl Hinton MRN: [id number] Date of Birth: 1936/01/05  Transition of [facility]) CM/SW Contact  Amada Jupiter, LCSW Phone Number: 09/13/2020, 4:11 PM  Clinical Narrative:     Per review of today's therapy notes and recommendations for SNF, I discussed these recommendations with pt.  She agrees that her improvement is slower than anticipated and notes still with significant pain issues but improving some each day.  She is agreeable with idea of short term SNF for rehab instead of ALF but hopeful that tomorrow therapy sessions are better.  I have left a VM for admissions director at [facility] to determine if they have any short term SNF beds open if this is confirmed to be the plan.  Will follow up with pt and team in the morning.  Expected Discharge Plan: Assisted Living (@ [facility]) Barriers to Discharge: Continued Medical Work up  Expected Discharge Plan and Services Expected Discharge Plan: Assisted Living (@ [facility]) In-house Referral: Clinical Social Work     Living arrangements for the past 2 months: Independent Living Facility Expected Discharge Date: 09/13/20               DME Arranged: Dan Humphreys rolling DME Agency: [employer]                   Social Determinants of Health (SDOH) Interventions    Readmission Risk Interventions Readmission Risk Prevention Plan 09/12/2020  Post Dischage Appt Complete  Medication Screening Complete  Transportation Screening Complete  Some recent data might be hidden

## 2020-09-13 NOTE — Evaluation (Signed)
Occupational Therapy Evaluation Patient Details Name: Cheryl Hinton MRN: [id number] DOB: 28-Mar-1936 Today's Date: 09/13/2020    History of Present Illness 85 yo female s/p R TKA 09/11/20. Hx of chronic pain   Clinical Impression   Mrs. Cheryl Hinton is an 85 year old woman s/p knee replacement who presents with pain, decreased ROM and strength of right knee, generalized weakness, decreased balance and poor activity tolerance resulting in sudden decline in functional abilities. Today patient min assist to transfer to side of bed but activity limited due to pain. Patient had been premedicated with additional pain medicines. Patient able to perform grooming task at side of bed. Unable to stand with walker, assistance of therapist and elevated bed height initially. PT in room for handoff. With close to high perch position at side of bed patient able to stand with min assist and stand pivot to [facility]. Patient requiring max assist for toileting and total assist for LB dressing. Patient will benefit from skilled OT services while in hospital to improve deficits and learn compensatory strategies as needed in order to return PLOF.  At this time patient needing significant assistance for daily tasks as well as limited mobility. Recommend short term rehab at discharge.    Follow Up Recommendations  SNF    Equipment Recommendations  3 in 1 bedside commode    Recommendations for Other Services       Precautions / Restrictions Precautions Precautions: Fall;Knee Required Braces or Orthoses: Knee Immobilizer - Right Knee Immobilizer - Right: Discontinue once straight leg raise with < 10 degree lag Restrictions Weight Bearing Restrictions: No RLE Weight Bearing: Weight bearing as tolerated      Mobility Bed Mobility Overal bed mobility: Needs Assistance Bed Mobility: Supine to Sit     Supine to sit: Min assist     General bed mobility comments: min assist to transfer to side of bed -  threapist assisting RLE    Transfers Overall transfer level: Needs assistance Equipment used: Rolling walker (2 wheeled) Transfers: Sit to/from [employer] Sit to Stand: Min assist;From elevated surface Stand pivot transfers: Min assist            Balance Overall balance assessment: Needs assistance Sitting-balance support: Feet supported Sitting balance-Leahy Scale: Good     Standing balance support: Bilateral upper extremity supported Standing balance-Leahy Scale: Poor Standing balance comment: reliant on walker                           ADL either performed or assessed with clinical judgement   ADL Overall ADL's : Needs assistance/impaired Eating/Feeding: Independent   Grooming: Set up;Sitting   Upper Body Bathing: Set up;Sitting   Lower Body Bathing: Maximal assistance;Sitting/lateral leans   Upper Body Dressing : Set up;Sitting   Lower Body Dressing: Sit to/from stand;Total assistance   Toilet Transfer: Minimal assistance;BSC;RW;Stand-pivot   Toileting- Clothing Manipulation and Hygiene: Maximal assistance;Sit to/from stand               Vision Patient Visual Report: No change from baseline       Perception     Praxis      Pertinent Vitals/Pain Pain Assessment: Faces Faces Pain Scale: Hurts whole lot Pain Location: R knee Pain Descriptors / Indicators: Grimacing;Discomfort;Sore;Aching Pain Intervention(s): Premedicated before session     Hand Dominance     Extremity/Trunk Assessment Upper Extremity Assessment Upper Extremity Assessment: Generalized weakness   Lower Extremity Assessment Lower Extremity Assessment: Defer  to PT evaluation   Cervical / Trunk Assessment Cervical / Trunk Assessment: Normal   Communication Communication Communication: HOH   Cognition Arousal/Alertness: Awake/alert Behavior During Therapy: WFL for tasks assessed/performed Overall Cognitive Status: Within Functional Limits for  tasks assessed                                     General Comments       Exercises     Shoulder Instructions      Home Living Family/patient expects to be discharged to:: Private residence Living Arrangements: Alone   Type of Home: Independent living facility ([facility]) Home Access: Level entry     Home Layout: One level               Home Equipment: Environmental consultant - 2 wheels   Additional Comments: Pt stated she is planning to d/c to ALF before returning to her independent living apt      Prior Functioning/Environment Level of Independence: Independent with assistive device(s)        Comments: using cane for ambulation.        OT Problem List: Decreased strength;Decreased range of motion;Decreased activity tolerance;Impaired balance (sitting and/or standing);Decreased knowledge of use of DME or AE;Pain;Increased edema      OT Treatment/Interventions: Self-care/ADL training;DME and/or AE instruction;Patient/family education;Therapeutic activities;Balance training    OT Goals(Current goals can be found in the care plan section) Acute Rehab OT Goals Patient Stated Goal: less pain. OT Goal Formulation: With patient Time For Goal Achievement: 09/27/20 Potential to Achieve Goals: Good  OT Frequency: Min 2X/week   Barriers to D/C:            Co-evaluation              AM-PAC OT "6 Clicks" Daily Activity     Outcome Measure Help from another person eating meals?: None Help from another person taking care of personal grooming?: A Little Help from another person toileting, which includes using toliet, bedpan, or urinal?: A Lot Help from another person bathing (including washing, rinsing, drying)?: A Lot Help from another person to put on and taking off regular upper body clothing?: A Little Help from another person to put on and taking off regular lower body clothing?: Total 6 Click Score: 15   End of Session Equipment Utilized During  Treatment: Gait belt;Rolling walker Nurse Communication: Other (comment) (continued complaints of pain)  Activity Tolerance: Patient limited by pain Patient left: Other (comment) (with Cheryl Hinton, PTA)  OT Visit Diagnosis: Other abnormalities of gait and mobility (R26.89);Pain Pain - Right/Left: Right Pain - part of body: Knee                Time: 1044-1106 OT Time Calculation (min): 22 min Charges:  OT General Charges $OT Visit: 1 Visit OT Evaluation $OT Eval Low Complexity: 1 Low  Riot Waterworth, OTR/L Acute Care Rehab Services  Office [phone] Pager: [phone]   Kelli Churn 09/13/2020, 11:23 AM

## 2020-09-13 NOTE — Progress Notes (Signed)
   Subjective: 2 Days Post-Op Procedure(s) (LRB): TOTAL KNEE ARTHROPLASTY (Right) Patient reports pain as moderate.   Patient seen in rounds for Dr. Lequita Halt. Patient is resting in bed comfortably. Reports she is voiding without difficulty, but reports burning and urgency. Most likely just irritation from the foley, but will obtain a UA to rule out any infection. Denies chest pain, SOB, or calf pain. + flatus. Plan is to go Home after hospital stay.  Objective: Vital signs in last 24 hours: Temp:  [98 F (36.7 C)-98.5 F (36.9 C)] 98.5 F (36.9 C) (04/20 0512) Pulse Rate:  [75-97] 79 (04/20 0512) Resp:  [16-18] 16 (04/20 0512) BP: (130-156)/(61-77) 156/71 (04/20 0512) SpO2:  [89 %-99 %] 99 % (04/20 0512)  Intake/Output from previous day:  Intake/Output Summary (Last 24 hours) at 09/13/2020 0857 Last data filed at 09/13/2020 0600 Gross per 24 hour  Intake 360 ml  Output 600 ml  Net -240 ml    Intake/Output this shift: No intake/output data recorded.  Labs: Recent Labs    09/12/20 0302 09/13/20 0254  HGB 9.3* 9.1*   Recent Labs    09/12/20 0302 09/13/20 0254  WBC 7.6 9.1  RBC 2.98* 2.97*  HCT 28.7* 28.2*  PLT 159 176   Recent Labs    09/12/20 0302 09/13/20 0254  NA 140 136  K 3.1* 3.4*  CL 103 100  CO2 32 26  BUN 9 9  CREATININE 0.57 0.50  GLUCOSE 128* 111*  CALCIUM 7.8* 8.3*   No results for input(s): LABPT, INR in the last 72 hours.  Exam: General - Patient is Alert and Oriented Extremity - Neurologically intact Neurovascular intact Sensation intact distally Dorsiflexion/Plantar flexion intact Dressing/Incision - clean, dry, no drainage Motor Function - intact, moving foot and toes well on exam.   Past Medical History:  Diagnosis Date  . Anemia    Prior to hysterectomy  . Anxiety   . Arthritis   . Barrett's esophagus   . Colon polyps 1985  . Diverticulosis   . Endometriosis   . Esophageal stricture   . GERD (gastroesophageal reflux  disease)   . Hx of migraine headaches   . Hyperlipidemia   . Hypertension   . IBS (irritable bowel syndrome)   . Pneumonia   . Status post dilation of esophageal narrowing   . Urticaria     Assessment/Plan: 2 Days Post-Op Procedure(s) (LRB): TOTAL KNEE ARTHROPLASTY (Right) Principal Problem:   OA (osteoarthritis) of knee Active Problems:   Primary osteoarthritis of right knee   Primary osteoarthritis of knee  Estimated body mass index is 26.58 kg/m as calculated from the following:   Height as of this encounter: 5' 3.39" (1.61 m).   Weight as of this encounter: 68.9 kg. Up with therapy D/C IV fluids  DVT Prophylaxis - Xarelto Weight-bearing as tolerated  Plan for discharge back to [facility] assisted living once meeting goals with therapy. Possible discharge later today if more mobile and cleared by PT.  Scheduled to received physical therapy once home through [facility]. Follow-up in the office in 2 weeks.   The PDMP database was reviewed today prior to any opioid medications being prescribed to this patient.  Arther Abbott, PA-C Orthopedic Surgery [phone] 09/13/2020, 8:57 AM

## 2020-09-13 NOTE — Progress Notes (Signed)
OT Cancellation Note  Patient Details Name: BRITAIN ANAGNOS MRN: [id number] DOB: 09-Aug-1935   Cancelled Treatment:    Reason Eval/Treat Not Completed: Pain limiting ability to participate . Attempted x 2 this a.m. for OT evaluation. Patient reporting pain not controlled. Will f/u as able. Darionna Banke L Leyani Gargus 09/13/2020, 9:49 AM

## 2020-09-14 LAB — CBC
HCT: 26.1 % — ABNORMAL LOW (ref 36.0–46.0)
Hemoglobin: 8.4 g/dL — ABNORMAL LOW (ref 12.0–15.0)
MCH: 31.2 pg (ref 26.0–34.0)
MCHC: 32.2 g/dL (ref 30.0–36.0)
MCV: 97 fL (ref 80.0–100.0)
Platelets: 151 10*3/uL (ref 150–400)
RBC: 2.69 MIL/uL — ABNORMAL LOW (ref 3.87–5.11)
RDW: 13.9 % (ref 11.5–15.5)
WBC: 7.8 10*3/uL (ref 4.0–10.5)
nRBC: 0 % (ref 0.0–0.2)

## 2020-09-14 NOTE — Progress Notes (Signed)
Physical Therapy Treatment Patient Details Name: Cheryl Hinton MRN: [id number] DOB: 08-22-1935 Today's Date: 09/14/2020    History of Present Illness 85 yo female s/p R TKA 09/11/20. Hx of chronic pain    PT Comments    POD # 3 pm session  Assisted OOB to amb to bathroom was difficult due to R knee pain 7/10 as well as R hip pain.  Gait is progressing slowly.  Remains unsteady esp with turns.  Pt not fully WBing thru R LE due to pain despite instructions for safety.   Pt will need 24/7 assist as she is not progressing to an Indep Level.    Follow Up Recommendations  SNF     Equipment Recommendations  None recommended by PT    Recommendations for Other Services       Precautions / Restrictions Precautions Precautions: Fall;Knee Precaution Comments: instructed no pillow under knee Restrictions Weight Bearing Restrictions: No RLE Weight Bearing: Weight bearing as tolerated    Mobility  Bed Mobility Overal bed mobility: Needs Assistance Bed Mobility: Supine to Sit;Sit to Supine     Supine to sit: Min assist Sit to supine: Max assist   General bed mobility comments: Pt requires assist to support R LE off bed but required Max Assist back to bed to support B LE and scoot to [facility]    Transfers Overall transfer level: Needs assistance Equipment used: Rolling walker (2 wheeled) Transfers: Sit to/from [employer] Sit to Stand: Min assist;From elevated surface;Mod assist Stand pivot transfers: Min assist     Assisted with commode transfer in which pt was unable to self perform hygiene and was unable to self rise off commode due to R knee pain and B UE weakness.   Ambulation/Gait Ambulation/Gait assistance: Min assist Gait Distance (Feet): 18 Feet Assistive device: Rolling walker (2 wheeled) Gait Pattern/deviations: Step-to pattern;Trunk flexed;Antalgic Gait velocity: decreased  Assisted with amb to and from bathroom.   General Gait Details: assisted  with amb to and from bathroom was difficult due to R knee pain, R hip pain "Bursitis" stated pt and "other" knee crepitus/instability.   Stairs             Wheelchair Mobility    Modified Rankin (Stroke Patients Only)       Balance                                            Cognition Arousal/Alertness: Awake/alert Behavior During Therapy: WFL for tasks assessed/performed Overall Cognitive Status: Within Functional Limits for tasks assessed                                 General Comments: AxO x 3 pleasant      Exercises      General Comments        Pertinent Vitals/Pain Pain Assessment: 0-10 Pain Score: 8  Pain Location: R knee Pain Descriptors / Indicators: Grimacing;Discomfort;Sore;Aching Pain Intervention(s): Monitored during session;Premedicated before session;Repositioned;Ice applied    Home Living                      Prior Function            PT Goals (current goals can now be found in the care plan section) Progress towards PT goals: Progressing toward goals  Frequency    7X/week      PT Plan Current plan remains appropriate    Co-evaluation              AM-PAC PT "6 Clicks" Mobility   Outcome Measure  Help needed turning from your back to your side while in a flat bed without using bedrails?: A Little Help needed moving from lying on your back to sitting on the side of a flat bed without using bedrails?: A Little Help needed moving to and from a bed to a chair (including a wheelchair)?: A Little Help needed standing up from a chair using your arms (e.g., wheelchair or bedside chair)?: A Little Help needed to walk in hospital room?: A Little Help needed climbing 3-5 steps with a railing? : A Lot 6 Click Score: 17    End of Session Equipment Utilized During Treatment: Gait belt Activity Tolerance: Patient limited by fatigue;No increased pain Patient left: in chair;with call  bell/phone within reach;with chair alarm set Nurse Communication: Mobility status PT Visit Diagnosis: History of falling (Z91.81);Pain;Other abnormalities of gait and mobility (R26.89) Pain - Right/Left: Right Pain - part of body: Knee     Time: 1027-2536 PT Time Calculation (min) (ACUTE ONLY): 27 min  Charges:  $Gait Training: 8-22 mins $Therapeutic Activity: 8-22 mins                     Felecia Shelling  PTA Acute  Rehabilitation Services Pager      [phone] Office      [phone]

## 2020-09-14 NOTE — Progress Notes (Signed)
Physical Therapy Treatment Patient Details Name: Cheryl Hinton MRN: [id number] DOB: June 09, 1935 Today's Date: 09/14/2020    History of Present Illness 85 yo female s/p R TKA 09/11/20. Hx of chronic pain    PT Comments    POD # 3 am session Assisted OOB to amb was a struggle.  General bed mobility comments: Pt requires assist to support R LE off bed.  Assisted to [facility]. Marland Kitchen  General transfer comment: min assist required from elevated bed but Mod Assist from commode as pt was unable to selr rise.  "other" knee has audible crepitus and pt c/o R hip pain "bursitis" stated pt. General Gait Details: distance limited by pain and effort.  "other knee" has crepitus/weakness.  Pain is 7/10 pre medicated. Balance is poor esp with turns and back steps.  HIGH FALL RISK.   Performed a few TE's but limited by pain.    Follow Up Recommendations  SNF     Equipment Recommendations  None recommended by PT    Recommendations for Other Services       Precautions / Restrictions Precautions Precautions: Fall;Knee Precaution Comments: instructed no pillow under knee Restrictions Weight Bearing Restrictions: No RLE Weight Bearing: Weight bearing as tolerated    Mobility  Bed Mobility Overal bed mobility: Needs Assistance Bed Mobility: Supine to Sit;Sit to Supine     Supine to sit: Min assist Sit to supine: Max assist   General bed mobility comments: Pt requires assist to support R LE off bed but required Max Assist back to bed to support B LE and scoot to [facility]    Transfers Overall transfer level: Needs assistance Equipment used: Rolling walker (2 wheeled) Transfers: Sit to/from [employer] Sit to Stand: Min assist;From elevated surface;Mod assist Stand pivot transfers: Min assist       General transfer comment: min assist required from elevated bed but Mod Assist from commode as pt was unable to selr rise.  "other" knee has audible crepitus and pt c/o R hip pain "bursitis"  stated pt.  Ambulation/Gait Ambulation/Gait assistance: Min assist Gait Distance (Feet): 8 Feet Assistive device: Rolling walker (2 wheeled) Gait Pattern/deviations: Step-to pattern;Trunk flexed;Antalgic Gait velocity: decreased   General Gait Details: distance limited by pain and effort.  "other knee" has crepitus/weakness.  Pain is 7/10 pre medicated. Balance is poor esp with turns and back steps.  HIGH FALL RISK.   Stairs             Wheelchair Mobility    Modified Rankin (Stroke Patients Only)       Balance                                            Cognition Arousal/Alertness: Awake/alert Behavior During Therapy: WFL for tasks assessed/performed Overall Cognitive Status: Within Functional Limits for tasks assessed                                 General Comments: AxO x 3 pleasant      Exercises   Total Knee Replacement TE's following HEP handout 10 reps B LE ankle pumps 05 reps towel squeezes 05 reps knee presses 05 reps heel slides   Educated on use of gait belt to assist with TE's Followed by [facility]    General Comments  Pertinent Vitals/Pain Pain Assessment: 0-10 Pain Score: 8  Pain Location: R knee Pain Descriptors / Indicators: Grimacing;Discomfort;Sore;Aching Pain Intervention(s): Monitored during session;Premedicated before session;Repositioned;Ice applied    Home Living                      Prior Function            PT Goals (current goals can now be found in the care plan section) Progress towards PT goals: Progressing toward goals    Frequency    7X/week      PT Plan Current plan remains appropriate    Co-evaluation              AM-PAC PT "6 Clicks" Mobility   Outcome Measure  Help needed turning from your back to your side while in a flat bed without using bedrails?: A Little Help needed moving from lying on your back to sitting on the side of a flat bed without  using bedrails?: A Little Help needed moving to and from a bed to a chair (including a wheelchair)?: A Little Help needed standing up from a chair using your arms (e.g., wheelchair or bedside chair)?: A Little Help needed to walk in hospital room?: A Little Help needed climbing 3-5 steps with a railing? : A Lot 6 Click Score: 17    End of Session Equipment Utilized During Treatment: Gait belt Activity Tolerance: Patient limited by fatigue;No increased pain Patient left: in chair;with call bell/phone within reach;with chair alarm set Nurse Communication: Mobility status PT Visit Diagnosis: History of falling (Z91.81);Pain;Other abnormalities of gait and mobility (R26.89) Pain - Right/Left: Right Pain - part of body: Knee     Time: 1135-1200 PT Time Calculation (min) (ACUTE ONLY): 25 min  Charges:  $Gait Training: 8-22 mins $Therapeutic Activity: 8-22 mins                     Felecia Shelling  PTA Acute  Rehabilitation Services Pager      [phone] Office      [phone]

## 2020-09-14 NOTE — Progress Notes (Signed)
   Subjective: 3 Days Post-Op Procedure(s) (LRB): TOTAL KNEE ARTHROPLASTY (Right) Patient reports pain as mild and moderate.   Patient seen in rounds by Dr. Lequita Halt. Patient is well, and has had no acute complaints or problems. Denies SOB, chest pain, or calf pain. Ambulated two feet with PT yesterday.  Plan is to go Home after hospital stay.  Objective: Vital signs in last 24 hours: Temp:  [97.6 F (36.4 C)-98 F (36.7 C)] 97.9 F (36.6 C) (04/21 0445) Pulse Rate:  [68-77] 68 (04/21 0445) Resp:  [16-19] 18 (04/21 0445) BP: (104-150)/(49-73) 150/64 (04/21 0445) SpO2:  [93 %-100 %] 100 % (04/21 0445)  Intake/Output from previous day:  Intake/Output Summary (Last 24 hours) at 09/14/2020 0811 Last data filed at 09/14/2020 4854 Gross per 24 hour  Intake 194.11 ml  Output 650 ml  Net -455.89 ml    Intake/Output this shift: No intake/output data recorded.  Labs: Recent Labs    09/12/20 0302 09/13/20 0254 09/14/20 0302  HGB 9.3* 9.1* 8.4*   Recent Labs    09/13/20 0254 09/14/20 0302  WBC 9.1 7.8  RBC 2.97* 2.69*  HCT 28.2* 26.1*  PLT 176 151   Recent Labs    09/12/20 0302 09/13/20 0254  NA 140 136  K 3.1* 3.4*  CL 103 100  CO2 32 26  BUN 9 9  CREATININE 0.57 0.50  GLUCOSE 128* 111*  CALCIUM 7.8* 8.3*   No results for input(s): LABPT, INR in the last 72 hours.  Exam: General - Patient is Alert and Oriented Extremity - Neurologically intact Neurovascular intact Sensation intact distally Dorsiflexion/Plantar flexion intact Dressing/Incision - clean, dry, no drainage Motor Function - intact, moving foot and toes well on exam.   Past Medical History:  Diagnosis Date  . Anemia    Prior to hysterectomy  . Anxiety   . Arthritis   . Barrett's esophagus   . Colon polyps 1985  . Diverticulosis   . Endometriosis   . Esophageal stricture   . GERD (gastroesophageal reflux disease)   . Hx of migraine headaches   . Hyperlipidemia   . Hypertension   . IBS  (irritable bowel syndrome)   . Pneumonia   . Status post dilation of esophageal narrowing   . Urticaria     Assessment/Plan: 3 Days Post-Op Procedure(s) (LRB): TOTAL KNEE ARTHROPLASTY (Right) Principal Problem:   OA (osteoarthritis) of knee Active Problems:   Primary osteoarthritis of right knee   Primary osteoarthritis of knee  Estimated body mass index is 26.58 kg/m as calculated from the following:   Height as of this encounter: 5' 3.39" (1.61 m).   Weight as of this encounter: 68.9 kg. Up with therapy  DVT Prophylaxis - Xarelto and TED hose Weight-bearing as tolerated  Plan for patient is to discharge to [facility] assisted living center once she is stable and meeting goals with physical therapy. Planning for two sessions today, and possible discharge this afternoon if meeting goals with PT.   Patient to follow up in clinic with Dr. Lequita Halt in two weeks.   The PDMP database was reviewed today prior to any opioid medications being prescribed to this patient.  Nelia Shi, MBA, PA-C Orthopedic Surgery 09/14/2020, 8:11 AM

## 2020-09-15 ENCOUNTER — Encounter (HOSPITAL_COMMUNITY): Payer: Medicare PPO

## 2020-09-15 ENCOUNTER — Non-Acute Institutional Stay (SKILLED_NURSING_FACILITY): Payer: Medicare PPO | Admitting: Nurse Practitioner

## 2020-09-15 DIAGNOSIS — M79606 Pain in leg, unspecified: Secondary | ICD-10-CM

## 2020-09-15 DIAGNOSIS — E876 Hypokalemia: Secondary | ICD-10-CM | POA: Diagnosis not present

## 2020-09-15 DIAGNOSIS — F419 Anxiety disorder, unspecified: Secondary | ICD-10-CM | POA: Insufficient documentation

## 2020-09-15 DIAGNOSIS — Z79899 Other long term (current) drug therapy: Secondary | ICD-10-CM | POA: Diagnosis not present

## 2020-09-15 DIAGNOSIS — K589 Irritable bowel syndrome without diarrhea: Secondary | ICD-10-CM | POA: Diagnosis not present

## 2020-09-15 DIAGNOSIS — R279 Unspecified lack of coordination: Secondary | ICD-10-CM | POA: Diagnosis not present

## 2020-09-15 DIAGNOSIS — Z96651 Presence of right artificial knee joint: Secondary | ICD-10-CM | POA: Diagnosis not present

## 2020-09-15 DIAGNOSIS — K22719 Barrett's esophagus with dysplasia, unspecified: Secondary | ICD-10-CM | POA: Diagnosis not present

## 2020-09-15 DIAGNOSIS — M1711 Unilateral primary osteoarthritis, right knee: Secondary | ICD-10-CM | POA: Diagnosis present

## 2020-09-15 DIAGNOSIS — I10 Essential (primary) hypertension: Secondary | ICD-10-CM | POA: Diagnosis present

## 2020-09-15 DIAGNOSIS — M545 Low back pain, unspecified: Secondary | ICD-10-CM

## 2020-09-15 DIAGNOSIS — K219 Gastro-esophageal reflux disease without esophagitis: Secondary | ICD-10-CM | POA: Diagnosis present

## 2020-09-15 DIAGNOSIS — D500 Iron deficiency anemia secondary to blood loss (chronic): Secondary | ICD-10-CM

## 2020-09-15 DIAGNOSIS — E782 Mixed hyperlipidemia: Secondary | ICD-10-CM | POA: Diagnosis not present

## 2020-09-15 DIAGNOSIS — G43909 Migraine, unspecified, not intractable, without status migrainosus: Secondary | ICD-10-CM | POA: Insufficient documentation

## 2020-09-15 DIAGNOSIS — G8929 Other chronic pain: Secondary | ICD-10-CM | POA: Diagnosis not present

## 2020-09-15 DIAGNOSIS — R5381 Other malaise: Secondary | ICD-10-CM | POA: Diagnosis not present

## 2020-09-15 DIAGNOSIS — M1712 Unilateral primary osteoarthritis, left knee: Secondary | ICD-10-CM | POA: Diagnosis present

## 2020-09-15 DIAGNOSIS — Z87891 Personal history of nicotine dependence: Secondary | ICD-10-CM | POA: Diagnosis not present

## 2020-09-15 DIAGNOSIS — Z90710 Acquired absence of both cervix and uterus: Secondary | ICD-10-CM | POA: Diagnosis not present

## 2020-09-15 DIAGNOSIS — Z743 Need for continuous supervision: Secondary | ICD-10-CM | POA: Diagnosis not present

## 2020-09-15 DIAGNOSIS — Z20822 Contact with and (suspected) exposure to covid-19: Secondary | ICD-10-CM | POA: Diagnosis present

## 2020-09-15 DIAGNOSIS — Z7982 Long term (current) use of aspirin: Secondary | ICD-10-CM | POA: Diagnosis not present

## 2020-09-15 DIAGNOSIS — M170 Bilateral primary osteoarthritis of knee: Secondary | ICD-10-CM | POA: Diagnosis not present

## 2020-09-15 DIAGNOSIS — E785 Hyperlipidemia, unspecified: Secondary | ICD-10-CM | POA: Diagnosis present

## 2020-09-15 MED ORDER — RIVAROXABAN 10 MG PO TABS: 10.0000 mg | ORAL_TABLET | Freq: Every day | ORAL | 0 refills | Status: AC

## 2020-09-15 MED ORDER — POLYETHYLENE GLYCOL 3350 17 G PO PACK
17.0000 g | PACK | Freq: Every day | ORAL | Status: DC
  Administered 2020-09-15: 17 g via ORAL

## 2020-09-15 MED ORDER — HYDROMORPHONE HCL 2 MG PO TABS: 2.0000 mg | ORAL_TABLET | Freq: Four times a day (QID) | ORAL | 0 refills | Status: DC | PRN

## 2020-09-15 MED ORDER — METHOCARBAMOL 500 MG PO TABS: 500.0000 mg | ORAL_TABLET | Freq: Four times a day (QID) | ORAL | 0 refills | Status: DC | PRN

## 2020-09-15 MED ORDER — MAGNESIUM CITRATE PO SOLN
1.0000 | Freq: Once | ORAL | Status: AC
  Administered 2020-09-15: 1 via ORAL
  Filled 2020-09-15: qty 296

## 2020-09-15 NOTE — Assessment & Plan Note (Signed)
Barrett's esophagus/GERD/esophageal stricture s/p dilation of esophageal narrowing, takes Pantoprazole, Sucralfate.

## 2020-09-15 NOTE — Assessment & Plan Note (Signed)
Migraine HA, takes Lyrica.

## 2020-09-15 NOTE — Progress Notes (Signed)
Location:   SNF [place] Room Number: 43 Place of Service:  SNF (31) Provider: [facility] Omarr Hann NP  Lawerance Cruel, MD  Patient Care Team: Lawerance Cruel, MD as PCP - General (Family Medicine) Nahser, Wonda Cheng, MD as PCP - Cardiology (Cardiology)  Extended Emergency Contact Information Primary Emergency Contact: [place] Phone: [phone] Mobile Phone: [phone] Relation: Nephew Secondary Emergency Contact: Olegario Messier Mobile Phone: [phone] Relation: Friend  Code Status: DNR Goals of care: Advanced Directive information Advanced Directives 09/19/2020  Does Patient Have a Medical Advance Directive? Yes  Type of Advance Directive Living will;Healthcare Power of Attorney  Does patient want to make changes to medical advance directive? No - Patient declined  Copy of [place] in Chart? Yes - validated most recent copy scanned in chart (See row information)     Chief Complaint  Patient presents with  . Acute Visit    Medication review following hospital stay    HPI:  Pt is a 85 y.o. female seen today for an acute visit for medication review following hospital stay.   Hospitalized 09/11/20-09/15/20 for the right total knee arthroplasty, WBAT, takes Xarelto then resume ASA, prn Hydromorphine, Methocarbamol  for pain  Post op anemia, Hgb 8.4 09/14/20  Hypokalemia, K 3.4 09/13/20, taking KCL  Anxiety, takes Alprazolam, Hydroxyzine, Sertraline  OA R+L knee, s/p TKR right   Barrett's esophagus/GERD/esophageal stricture s/p dilation of esophageal narrowing, takes Pantoprazole, Sucralfate.   Migraine HA, takes Lyrica.   Hyperlipidemia, takes Ezetimibe, Rosuvastatin. LDL 129 06/01/20  IBS symptomatic management.   HTN, takes Nebivolol, Telmisarta/HCTZ, Bun/creat 9/0.5 4/202  Acyclovir        Past Medical History:  Diagnosis Date  . Anemia    Prior to hysterectomy  . Anxiety   . Arthritis   . Barrett's esophagus   . Colon polyps  1985  . Diverticulosis   . Endometriosis   . Esophageal stricture   . GERD (gastroesophageal reflux disease)   . Hx of migraine headaches   . Hyperlipidemia   . Hypertension   . IBS (irritable bowel syndrome)   . Pneumonia   . Status post dilation of esophageal narrowing   . Urticaria    Past Surgical History:  Procedure Laterality Date  . ADENOIDECTOMY    . APPENDECTOMY  1956  . BREAST BIOPSY Left   . CATARACT EXTRACTION W/ INTRAOCULAR LENS IMPLANT    . COLONOSCOPY    . ESOPHAGEAL MANOMETRY N/A 01/13/2017   Procedure: ESOPHAGEAL MANOMETRY (EM);  Surgeon: Mauri Pole, MD;  Location: [facility];  Service: Endoscopy;  Laterality: N/A;  . ESOPHAGOGASTRODUODENOSCOPY ENDOSCOPY    . FOOT SURGERY Bilateral    tumor removed  . HEMORROIDECTOMY    . KNEE SURGERY Bilateral [phone]   x 3  . PARTIAL HYSTERECTOMY    . TONSILLECTOMY    . TONSILLECTOMY    . TOTAL ABDOMINAL HYSTERECTOMY     for endometriosis  . TOTAL KNEE ARTHROPLASTY Right 09/11/2020   Procedure: TOTAL KNEE ARTHROPLASTY;  Surgeon: Gaynelle Arabian, MD;  Location: [facility];  Service: Orthopedics;  Laterality: Right;  38mn    Allergies  Allergen Reactions  . Duloxetine Hcl     Other reaction(s): blurry vision  . Influenza Vaccines     Other reaction(s): "side effects"  . Lisinopril     Other reaction(s): cough  . Nsaids     Other reaction(s): gastritis  . Other     Other reaction(s): GI upset  .  Praluent [Alirocumab]     Muscle pains  . Prednisone     Other reaction(s): Eye Swelling  . Repatha [Evolocumab]     itching  . Statins     Other reaction(s): myalgias    Allergies as of 09/15/2020      Reactions   Duloxetine Hcl    Other reaction(s): blurry vision   Influenza Vaccines    Other reaction(s): "side effects"   Lisinopril    Other reaction(s): cough   Nsaids    Other reaction(s): gastritis   Other    Other reaction(s): GI upset   Praluent [alirocumab]    Muscle pains   Prednisone     Other reaction(s): Eye Swelling   Repatha [evolocumab]    itching   Statins    Other reaction(s): myalgias      Medication List    Notice   This visit is during an admission. Changes to the med list made in this visit will be reflected in the After Visit Summary of the admission.     Review of Systems  Constitutional: Positive for activity change. Negative for appetite change, chills, diaphoresis and fever.  HENT: Negative for congestion, hearing loss, trouble swallowing and voice change.   Respiratory: Negative for cough and shortness of breath.   Cardiovascular: Positive for leg swelling. Negative for chest pain and palpitations.  Gastrointestinal: Negative for abdominal pain, constipation, nausea and vomiting.  Genitourinary: Negative for dysuria and urgency.  Musculoskeletal: Positive for arthralgias, back pain and gait problem.       Chronic left shoulder pain, rotator cuff issue, no decreased ROM upon my examination, chronic back pain with peripheral neuropathy. Chronic knee pain, has been to pain clinic for Oxycodone/Acetaminophen 10/325 q4r prn, never used Narcan per patient.   Skin: Positive for pallor.  Neurological: Negative for speech difficulty, weakness and headaches.  Psychiatric/Behavioral: Negative for behavioral problems, hallucinations and sleep disturbance. The patient is nervous/anxious.     Immunization History  Administered Date(s) Administered  . Influenza-Unspecified 06/04/2018  . Moderna Sars-Covid-2 Vaccination 06/28/2019, 07/26/2019, 04/04/2020  . Pneumococcal Conjugate-13 07/06/2014  . Pneumococcal Polysaccharide-23 04/13/1998  . Td 12/06/1998  . Tdap 08/09/2015  . Zoster 02/24/2012   Pertinent  Health Maintenance Due  Topic Date Due  . DEXA SCAN  Never done  . PNA vac Low Risk Adult (2 of 2 - PPSV23) 07/07/2015  . INFLUENZA VACCINE  12/25/2020   No flowsheet data found. Functional Status Survey:    Vitals:   09/18/20 1058  BP: (!)  150/83  Pulse: 96  Resp: 18  Temp: 97.6 F (36.4 C)  SpO2: 92%   There is no height or weight on file to calculate BMI. Physical Exam Vitals and nursing note reviewed.  Constitutional:      General: She is not in acute distress.    Appearance: Normal appearance. She is not ill-appearing, toxic-appearing or diaphoretic.  HENT:     Head: Normocephalic and atraumatic.     Nose: Nose normal.     Mouth/Throat:     Mouth: Mucous membranes are moist.  Eyes:     Extraocular Movements: Extraocular movements intact.     Conjunctiva/sclera: Conjunctivae normal.     Pupils: Pupils are equal, round, and reactive to light.  Cardiovascular:     Rate and Rhythm: Normal rate and regular rhythm.     Heart sounds: No murmur heard.     Comments: DP pulses R+L present.  Pulmonary:     Effort: Pulmonary effort  is normal.     Breath sounds: No wheezing or rales.  Abdominal:     General: Bowel sounds are normal.     Palpations: Abdomen is soft.     Tenderness: There is no abdominal tenderness.  Musculoskeletal:        General: Tenderness present.     Cervical back: Normal range of motion and neck supple.     Right lower leg: Edema present.     Left lower leg: No edema.     Comments: Right knee surgical incision is covered in dressing, no peri dressing redness or warmth, a few small ecchymoses seen, slight swelling RLE.   Skin:    General: Skin is warm and dry.     Comments: Right knee surgical incision is covered in dressing.   Neurological:     General: No focal deficit present.     Mental Status: She is alert and oriented to person, place, and time. Mental status is at baseline.     Motor: No weakness.     Coordination: Coordination normal.     Gait: Gait abnormal.  Psychiatric:        Mood and Affect: Mood normal.        Behavior: Behavior normal.        Thought Content: Thought content normal.        Judgment: Judgment normal.     Labs reviewed: Recent Labs    09/05/20 1515  09/12/20 0302 09/13/20 0254  NA 140 140 136  K 3.9 3.1* 3.4*  CL 102 103 100  CO2 31 32 26  GLUCOSE 106* 128* 111*  BUN 13 9 9   CREATININE 0.56 0.57 0.50  CALCIUM 9.4 7.8* 8.3*   Recent Labs    03/07/20 1121 06/01/20 1145 09/05/20 1515  AST 19 21 23   ALT 16 13 20   ALKPHOS 96 100 73  BILITOT 0.3 0.4 0.2*  PROT 6.5 7.1 7.3  ALBUMIN 4.1 4.3 4.0   Recent Labs    09/12/20 0302 09/13/20 0254 09/14/20 0302  WBC 7.6 9.1 7.8  HGB 9.3* 9.1* 8.4*  HCT 28.7* 28.2* 26.1*  MCV 96.3 94.9 97.0  PLT 159 176 151   Lab Results  Component Value Date   TSH 1.78 01/10/2009   No results found for: HGBA1C Lab Results  Component Value Date   CHOL 203 (H) 06/01/2020   HDL 59 06/01/2020   LDLCALC 129 (H) 06/01/2020   TRIG 82 06/01/2020   CHOLHDL 3.4 06/01/2020    Significant Diagnostic Results in last 30 days:  No results found.  Assessment/Plan: Blood loss anemia Add Fe 372m po qd MWF, repeat CBC/diff. Hgb 8.4 09/14/20  Hypokalemia Hypokalemia, K 3.4 09/13/20, taking KCL, update CMP/eGFR  Anxiety Anxiety, takes Alprazolam, Hydroxyzine, Sertraline, update TSH, MMSE 25/30 09/18/20  Primary osteoarthritis of right knee OA R+L knee, s/p TKR right, WBAT, in SNF [facility] for therapy, her goal is to return [place] [facility] where she resides prior the surgery. Xarelto 17 days then ASA, prn Tylenol, Methocarbamol, Oxycodone/acetaminophen 10/3262mq4hr prn x 5 days.   BARRETTS ESOPHAGUS Barrett's esophagus/GERD/esophageal stricture s/p dilation of esophageal narrowing, takes Pantoprazole, Sucralfate.   Migraine headache Migraine HA, takes Lyrica.    Hyperlipidemia Hyperlipidemia, takes Ezetimibe, Rosuvastatin. LDL 129 06/01/20. Update lipid panel.    IBS (irritable bowel syndrome) IBS symptomatic management.    Essential hypertension Sbp mildly elevated in 1502, symptomatic,  takes Nebivolol, Telmisarta/HCTZ, Bun/creat 9/0.5 4/202  Low back pain radiating to lower extremity Uses  Oxycodone/acetaminophen 10/325 q 4hr prn for pain in the lower back, sciatica R+L, left shoulder pain, and pain in knees, f/u pain clinic. Will have Oxycodone/acetaminophen 10/330m q4h prn x 5 days, Narcan available to her. Continue Lyrica, Methocarbamol prn.     Family/ staff Communication: plan of care reviewed with the patient and charge nurse.   Labs/tests ordered:    Time spend 35 minutes.

## 2020-09-15 NOTE — Assessment & Plan Note (Addendum)
Sbp mildly elevated in 1502, symptomatic,  takes Nebivolol, Telmisarta/HCTZ, Bun/creat 9/0.5 4/202

## 2020-09-15 NOTE — TOC Transition Note (Signed)
Transition of Care [facility]) - CM/SW Discharge Note   Patient Details  Name: Cheryl Hinton MRN: [id number] Date of Birth: 06-12-1935  Transition of Care [facility]) CM/SW Contact:  Amada Jupiter, LCSW Phone Number: 09/15/2020, 3:47 PM   Clinical Narrative:    Pt medically cleared for dc today to SNF at [facility] [place].  Insurance auth received (auth# [id number]).  PTAR called at 3:30 and reporting it could be several hours before pick up.  RN to call report to [phone].  No further TOC needs.   Final next level of care: Skilled Nursing Facility Barriers to Discharge: Barriers Resolved   Patient Goals and CMS Choice Patient states their goals for this hospitalization and ongoing recovery are:: [place]      Discharge Placement   Existing PASRR number confirmed : 09/13/20          Patient chooses bed at: [facility] Patient to be transferred to facility by: PTAR Name of family member notified: pt states she will notify her family when she has arrived at [facility]    Discharge Plan and Services In-house Referral: Clinical Social Work              DME Arranged: Dan Humphreys rolling DME Agency: [employer]                  Social Determinants of Health (SDOH) Interventions     Readmission Risk Interventions Readmission Risk Prevention Plan 09/12/2020  Post Dischage Appt Complete  Medication Screening Complete  Transportation Screening Complete  Some recent data might be hidden

## 2020-09-15 NOTE — Progress Notes (Signed)
Physical Therapy Treatment Patient Details Name: Cheryl Hinton MRN: [id number] DOB: February 10, 1936 Today's Date: 09/15/2020    History of Present Illness 85 yo female s/p R TKA 09/11/20. Hx of chronic pain    PT Comments    POD # 4 am session Assisted pt OOB to amb to bathroom was difficult.  General transfer comment: min assist required from elevated bed but Mod Assist from commode as pt was unable to selr rise.  "other" knee has audible crepitus and pt c/o R hip pain "bursitis" stated pt.  General Gait Details: assisted with amb to and from bathroom was difficult due to R knee pain, R hip pain "Bursitis" stated pt and "other" knee crepitus/instability. Assisted to recliner and positioned to comfort.  Unable to perform any TE's due to pain level.  Applied ICE. Pt's mobility is progressing slowly with complications of R hip pain, "other" knee with crepitus, overal  Weakness and B shoulders.  Pt lives in [place] apartment alone and will needed 24/7 asisst such as SNF.    Follow Up Recommendations  SNF     Equipment Recommendations  None recommended by PT    Recommendations for Other Services       Precautions / Restrictions Precautions Precautions: Fall;Knee Precaution Comments: instructed no pillow under knee Restrictions Weight Bearing Restrictions: No RLE Weight Bearing: Weight bearing as tolerated    Mobility  Bed Mobility Overal bed mobility: Needs Assistance Bed Mobility: Supine to Sit     Supine to sit: Min assist     General bed mobility comments: required increased time and assist due to pain/effort.    Transfers Overall transfer level: Needs assistance Equipment used: Rolling walker (2 wheeled) Transfers: Sit to/from [employer] Sit to Stand: Min assist;From elevated surface;Mod assist Stand pivot transfers: Min assist;Mod assist       General transfer comment: min assist required from elevated bed but Mod Assist from commode as pt was unable  to selr rise.  "other" knee has audible crepitus and pt c/o R hip pain "bursitis" stated pt.  Ambulation/Gait Ambulation/Gait assistance: Min assist Gait Distance (Feet): 18 Feet Assistive device: Rolling walker (2 wheeled) Gait Pattern/deviations: Step-to pattern;Trunk flexed;Antalgic Gait velocity: decreased   General Gait Details: assisted with amb to and from bathroom was difficult due to R knee pain, R hip pain "Bursitis" stated pt and "other" knee crepitus/instability.   Stairs             Wheelchair Mobility    Modified Rankin (Stroke Patients Only)       Balance                                            Cognition Arousal/Alertness: Awake/alert Behavior During Therapy: WFL for tasks assessed/performed Overall Cognitive Status: Within Functional Limits for tasks assessed                                 General Comments: AxO x 3 pleasant      Exercises      General Comments        Pertinent Vitals/Pain Pain Assessment: Faces Faces Pain Scale: Hurts little more Pain Location: R hip Pain Descriptors / Indicators: Grimacing;Discomfort;Sore;Aching Pain Intervention(s): Monitored during session;Premedicated before session;Repositioned;Ice applied    Home Living  Prior Function            PT Goals (current goals can now be found in the care plan section) Progress towards PT goals: Progressing toward goals    Frequency    7X/week      PT Plan Current plan remains appropriate    Co-evaluation              AM-PAC PT "6 Clicks" Mobility   Outcome Measure  Help needed turning from your back to your side while in a flat bed without using bedrails?: A Little Help needed moving from lying on your back to sitting on the side of a flat bed without using bedrails?: A Little Help needed moving to and from a bed to a chair (including a wheelchair)?: A Little Help needed standing up  from a chair using your arms (e.g., wheelchair or bedside chair)?: A Little Help needed to walk in hospital room?: A Little Help needed climbing 3-5 steps with a railing? : A Lot 6 Click Score: 17    End of Session Equipment Utilized During Treatment: Gait belt Activity Tolerance: Patient limited by fatigue;No increased pain Patient left: in chair;with call bell/phone within reach;with chair alarm set Nurse Communication: Mobility status PT Visit Diagnosis: History of falling (Z91.81);Pain;Other abnormalities of gait and mobility (R26.89) Pain - Right/Left: Right Pain - part of body: Knee     Time: 4917-9150 PT Time Calculation (min) (ACUTE ONLY): 26 min  Charges:  $Gait Training: 8-22 mins $Therapeutic Activity: 8-22 mins                     Felecia Shelling  PTA Acute  Rehabilitation Services Pager      [phone] Office      [phone]

## 2020-09-15 NOTE — NC FL2 (Signed)
[place] MEDICAID FL2 LEVEL OF CARE SCREENING TOOL     IDENTIFICATION  Patient Name: Cheryl Hinton Birthdate: [birth year] Sex: female Admission Date (Current Location): 09/11/2020  [facility] and [place] Number:  Producer, television/film/video and Address:  [facility],  [address], [place] [address]      Provider Number: [id number]  Attending Physician Name and Address:  Ollen Gross, MD  Relative Name and Phone Number:       Current Level of Care: Hospital Recommended Level of Care: Skilled Nursing Facility Prior Approval Number:    Date Approved/Denied:   PASRR Number: [id number]  Discharge Plan: SNF    Current Diagnoses: Patient Active Problem List   Diagnosis Date Noted  . Primary osteoarthritis of left knee 09/15/2020  . Primary osteoarthritis of knee 09/12/2020  . Primary osteoarthritis of right knee 09/11/2020  . Allergic contact dermatitis 08/21/2020  . Leg swelling 06/29/2019  . Upper airway cough syndrome 10/02/2017  . Shortness of breath 01/13/2012  . NAUSEA 05/25/2010  . BARRETTS ESOPHAGUS 01/10/2009  . CONSTIPATION 01/10/2009  . GERD 11/20/2007  . DYSPHAGIA 11/20/2007  . Hyperlipidemia 11/19/2007  . Essential hypertension 11/19/2007  . ESOPHAGEAL STRICTURE 11/19/2007  . GASTRITIS 11/19/2007  . DIVERTICULOSIS, COLON 11/19/2007  . OA (osteoarthritis) of knee 11/19/2007    Orientation RESPIRATION BLADDER Height & Weight     Self,Time,Situation,Place  Normal Continent Weight: 151 lb 14.4 oz (68.9 kg) Height:  5' 3.39" (161 cm)  BEHAVIORAL SYMPTOMS/MOOD NEUROLOGICAL BOWEL NUTRITION STATUS      Continent    AMBULATORY STATUS COMMUNICATION OF NEEDS Skin   Limited Assist Verbally Surgical wounds                       Personal Care Assistance Level of Assistance  Bathing,Dressing Bathing Assistance: Limited assistance   Dressing Assistance: Limited assistance     Functional Limitations Info             SPECIAL CARE  FACTORS FREQUENCY  OT (By licensed OT),PT (By licensed PT)     PT Frequency: 5x/wk OT Frequency: 5x/wk            Contractures Contractures Info: Not present    Additional Factors Info  Code Status,Allergies Code Status Info: Full Allergies Info: see MAR           Current Medications (09/15/2020):  This is the current hospital active medication list Current Facility-Administered Medications  Medication Dose Route Frequency Provider Last Rate Last Admin  . 0.9 %  sodium chloride infusion   Intravenous Continuous Edmisten, Kristie L, PA 150 mL/hr at 09/13/20 1245 Rate Change at 09/13/20 1245  . ALPRAZolam Prudy Feeler) tablet 0.25-0.5 mg  0.25-0.5 mg Oral QHS PRN Edmisten, Kristie L, PA   0.25 mg at 09/14/20 2222  . bisacodyl (DULCOLAX) suppository 10 mg  10 mg Rectal Daily PRN Edmisten, Kristie L, PA      . diphenhydrAMINE (BENADRYL) 12.5 MG/5ML elixir 12.5-25 mg  12.5-25 mg Oral Q4H PRN Edmisten, Kristie L, PA      . docusate sodium (COLACE) capsule 100 mg  100 mg Oral BID Edmisten, Kristie L, PA   100 mg at 09/15/20 0942  . ezetimibe (ZETIA) tablet 10 mg  10 mg Oral Daily Edmisten, Kristie L, PA   10 mg at 09/15/20 0943  . irbesartan (AVAPRO) tablet 300 mg  300 mg Oral Daily Ollen Gross, MD   300 mg at 09/15/20 0941   And  .  hydrochlorothiazide (HYDRODIURIL) tablet 25 mg  25 mg Oral Daily Ollen Gross, MD   25 mg at 09/15/20 0942  . hydrOXYzine (ATARAX/VISTARIL) tablet 50 mg  50 mg Oral QHS Edmisten, Kristie L, PA   50 mg at 09/14/20 2114  . menthol-cetylpyridinium (CEPACOL) lozenge 3 mg  1 lozenge Oral PRN Edmisten, Kristie L, PA       Or  . phenol (CHLORASEPTIC) mouth spray 1 spray  1 spray Mouth/Throat PRN Edmisten, Kristie L, PA      . methocarbamol (ROBAXIN) tablet 500 mg  500 mg Oral Q6H PRN Edmisten, Kristie L, PA   500 mg at 09/15/20 0942   Or  . methocarbamol (ROBAXIN) 500 mg in dextrose 5 % 50 mL IVPB  500 mg Intravenous Q6H PRN Edmisten, Kristie L, PA   Stopped at  09/11/20 1511  . metoCLOPramide (REGLAN) tablet 5-10 mg  5-10 mg Oral Q8H PRN Edmisten, Kristie L, PA       Or  . metoCLOPramide (REGLAN) injection 5-10 mg  5-10 mg Intravenous Q8H PRN Edmisten, Kristie L, PA      . morphine 2 MG/ML injection 0.5-1 mg  0.5-1 mg Intravenous Q2H PRN Edmisten, Kristie L, PA   1 mg at 09/13/20 2014  . nebivolol (BYSTOLIC) tablet 5 mg  5 mg Oral Daily Edmisten, Kristie L, PA   5 mg at 09/15/20 0943  . ondansetron (ZOFRAN) tablet 4 mg  4 mg Oral Q6H PRN Edmisten, Kristie L, PA   4 mg at 09/12/20 2219   Or  . ondansetron (ZOFRAN) injection 4 mg  4 mg Intravenous Q6H PRN Edmisten, Kristie L, PA      . oxyCODONE-acetaminophen (PERCOCET/ROXICET) 5-325 MG per tablet 1-2 tablet  1-2 tablet Oral Q4H PRN Edmisten, Kristie L, PA   2 tablet at 09/15/20 0942  . pantoprazole (PROTONIX) EC tablet 40 mg  40 mg Oral Daily Edmisten, Kristie L, PA   40 mg at 09/15/20 0943  . polyethylene glycol (MIRALAX / GLYCOLAX) packet 17 g  17 g Oral Daily PRN Edmisten, Kristie L, PA   17 g at 09/14/20 0815  . potassium chloride (KLOR-CON) CR tablet 20 mEq  20 mEq Oral Daily Edmisten, Kristie L, PA   20 mEq at 09/15/20 0942  . pregabalin (LYRICA) capsule 50 mg  50 mg Oral Daily Edmisten, Kristie L, PA   50 mg at 09/15/20 0942  . rivaroxaban (XARELTO) tablet 10 mg  10 mg Oral Q breakfast Edmisten, Kristie L, PA   10 mg at 09/15/20 0943  . sertraline (ZOLOFT) tablet 50 mg  50 mg Oral Daily Edmisten, Kristie L, PA   50 mg at 09/15/20 0942  . sodium phosphate (FLEET) 7-19 GM/118ML enema 1 enema  1 enema Rectal Once PRN Edmisten, Kristie L, PA      . sucralfate (CARAFATE) 1 GM/10ML suspension 1 g  1 g Oral TID PRN Edmisten, Kristie L, PA      . valACYclovir (VALTREX) tablet 500 mg  500 mg Oral Daily Edmisten, Kristie L, PA   500 mg at 09/15/20 6378     Discharge Medications: Please see discharge summary for a list of discharge medications.  Relevant Imaging Results:  Relevant Lab  Results:   Additional Information SS# [id number]  Amada Jupiter, LCSW

## 2020-09-15 NOTE — Assessment & Plan Note (Addendum)
Anxiety, takes Alprazolam, Hydroxyzine, Sertraline, update TSH, MMSE 25/30 09/18/20

## 2020-09-15 NOTE — Discharge Summary (Signed)
Physician Discharge Summary   Patient ID: Cheryl Hinton MRN: [id number] DOB/AGE: January 15, 1936 85 y.o.  Admit date: 09/11/2020 Discharge date: 09/15/2020  Primary Diagnosis: Osteoarthritis, right knee   Admission Diagnoses:  Past Medical History:  Diagnosis Date  . Anemia    Prior to hysterectomy  . Anxiety   . Arthritis   . Barrett's esophagus   . Colon polyps 1985  . Diverticulosis   . Endometriosis   . Esophageal stricture   . GERD (gastroesophageal reflux disease)   . Hx of migraine headaches   . Hyperlipidemia   . Hypertension   . IBS (irritable bowel syndrome)   . Pneumonia   . Status post dilation of esophageal narrowing   . Urticaria    Discharge Diagnoses:   Principal Problem:   OA (osteoarthritis) of knee Active Problems:   Primary osteoarthritis of right knee   Primary osteoarthritis of knee   Primary osteoarthritis of left knee  Estimated body mass index is 26.58 kg/m as calculated from the following:   Height as of this encounter: 5' 3.39" (1.61 m).   Weight as of this encounter: 68.9 kg.  Procedure:  Procedure(s) (LRB): TOTAL KNEE ARTHROPLASTY (Right)   Consults: None  HPI: Cheryl Hinton is a 85 y.o. year old female with end stage OA of her right knee with progressively worsening pain and dysfunction. She has constant pain, with activity and at rest and significant functional deficits with difficulties even with ADLs. She has had extensive non-op management including analgesics, injections of cortisone and viscosupplements, and home exercise program, but remains in significant pain with significant dysfunction.Radiographs show bone on bone arthritis medial and patellofemoral. She presents now for right Total Knee Arthroplasty.    Laboratory Data: Admission on 09/11/2020  Component Date Value Ref Range Status  . SARS Coronavirus 2 09/11/2020 NEGATIVE  NEGATIVE Final   Comment: (NOTE) SARS-CoV-2 target nucleic acids are NOT DETECTED.  The  SARS-CoV-2 RNA is generally detectable in upper and lower respiratory specimens during the acute phase of infection. The lowest concentration of SARS-CoV-2 viral copies this assay can detect is 250 copies / mL. A negative result does not preclude SARS-CoV-2 infection and should not be used as the sole basis for treatment or other patient management decisions.  A negative result may occur with improper specimen collection / handling, submission of specimen other than nasopharyngeal swab, presence of viral mutation(s) within the areas targeted by this assay, and inadequate number of viral copies (<250 copies / mL). A negative result must be combined with clinical observations, patient history, and epidemiological information.  Fact Sheet for Patients:   StrictlyIdeas.no  Fact Sheet for Healthcare Providers: BankingDealers.co.za  This test is not yet approved or                           cleared by the [place] FDA and has been authorized for detection and/or diagnosis of SARS-CoV-2 by FDA under an Emergency Use Authorization (EUA).  This EUA will remain in effect (meaning this test can be used) for the duration of the COVID-19 declaration under Section 564(b)(1) of the Act, 21 U.S.C. section 360bbb-3(b)(1), unless the authorization is terminated or revoked sooner.  Performed at [facility], [place] [address], [place], [place] [address]   . WBC 09/12/2020 7.6  4.0 - 10.5 K/uL Final  . RBC 09/12/2020 2.98* 3.87 - 5.11 MIL/uL Final  . Hemoglobin 09/12/2020 9.3* 12.0 - 15.0 g/dL Final  .  HCT 09/12/2020 28.7* 36.0 - 46.0 % Final  . MCV 09/12/2020 96.3  80.0 - 100.0 fL Final  . MCH 09/12/2020 31.2  26.0 - 34.0 pg Final  . MCHC 09/12/2020 32.4  30.0 - 36.0 g/dL Final  . RDW 09/12/2020 13.5  11.5 - 15.5 % Final  . Platelets 09/12/2020 159  150 - 400 K/uL Final  . nRBC 09/12/2020 0.0  0.0 - 0.2 % Final   Performed at  [facility], [place] [address] [place], [place] [address]  . Sodium 09/12/2020 140  135 - 145 mmol/L Final  . Potassium 09/12/2020 3.1* 3.5 - 5.1 mmol/L Final  . Chloride 09/12/2020 103  98 - 111 mmol/L Final  . CO2 09/12/2020 32  22 - 32 mmol/L Final  . Glucose, Bld 09/12/2020 128* 70 - 99 mg/dL Final   Glucose reference range applies only to samples taken after fasting for at least 8 hours.  . BUN 09/12/2020 9  8 - 23 mg/dL Final  . Creatinine, Ser 09/12/2020 0.57  0.44 - 1.00 mg/dL Final  . Calcium 09/12/2020 7.8* 8.9 - 10.3 mg/dL Final  . GFR, Estimated 09/12/2020 >60  >60 mL/min Final   Comment: (NOTE) Calculated using the CKD-EPI Creatinine Equation (2021)   . Anion gap 09/12/2020 5  5 - 15 Final   Performed at [facility], [place] [address] [place], [place] [address]  . WBC 09/13/2020 9.1  4.0 - 10.5 K/uL Final  . RBC 09/13/2020 2.97* 3.87 - 5.11 MIL/uL Final  . Hemoglobin 09/13/2020 9.1* 12.0 - 15.0 g/dL Final  . HCT 09/13/2020 28.2* 36.0 - 46.0 % Final  . MCV 09/13/2020 94.9  80.0 - 100.0 fL Final  . MCH 09/13/2020 30.6  26.0 - 34.0 pg Final  . MCHC 09/13/2020 32.3  30.0 - 36.0 g/dL Final  . RDW 09/13/2020 13.8  11.5 - 15.5 % Final  . Platelets 09/13/2020 176  150 - 400 K/uL Final  . nRBC 09/13/2020 0.0  0.0 - 0.2 % Final   Performed at [facility], [place] [address], [place], [place] [address]  . Sodium 09/13/2020 136  135 - 145 mmol/L Final  . Potassium 09/13/2020 3.4* 3.5 - 5.1 mmol/L Final  . Chloride 09/13/2020 100  98 - 111 mmol/L Final  . CO2 09/13/2020 26  22 - 32 mmol/L Final  . Glucose, Bld 09/13/2020 111* 70 - 99 mg/dL Final   Glucose reference range applies only to samples taken after fasting for at least 8 hours.  . BUN 09/13/2020 9  8 - 23 mg/dL Final  . Creatinine, Ser 09/13/2020 0.50  0.44 - 1.00 mg/dL Final  . Calcium 09/13/2020 8.3* 8.9 - 10.3 mg/dL Final  . GFR, Estimated 09/13/2020 >60  >60 mL/min  Final   Comment: (NOTE) Calculated using the CKD-EPI Creatinine Equation (2021)   . Anion gap 09/13/2020 10  5 - 15 Final   Performed at [facility], [place] [address] [place], [place] [address]  . Color, Urine 09/13/2020 YELLOW  YELLOW Final  . APPearance 09/13/2020 HAZY* CLEAR Final  . Specific Gravity, Urine 09/13/2020 1.018  1.005 - 1.030 Final  . pH 09/13/2020 5.0  5.0 - 8.0 Final  . Glucose, UA 09/13/2020 NEGATIVE  NEGATIVE mg/dL Final  . Hgb urine dipstick 09/13/2020 MODERATE* NEGATIVE Final  . Bilirubin Urine 09/13/2020 NEGATIVE  NEGATIVE Final  . Ketones, ur 09/13/2020 80* NEGATIVE mg/dL Final  . Protein, ur 09/13/2020 100* NEGATIVE mg/dL  Final  . Nitrite 09/13/2020 NEGATIVE  NEGATIVE Final  . Chalmers Guest 09/13/2020 SMALL* NEGATIVE Final  . RBC / HPF 09/13/2020 21-50  0 - 5 RBC/hpf Final  . WBC, UA 09/13/2020 21-50  0 - 5 WBC/hpf Final  . Bacteria, UA 09/13/2020 RARE* NONE SEEN Final  . Squamous Epithelial / LPF 09/13/2020 0-5  0 - 5 Final  . Mucus 09/13/2020 PRESENT   Final  . Hyaline Casts, UA 09/13/2020 PRESENT   Final   Performed at [facility], [place] [address] [place], [place] [address]  . SARS Coronavirus 2 09/13/2020 NEGATIVE  NEGATIVE Final   Comment: (NOTE) SARS-CoV-2 target nucleic acids are NOT DETECTED.  The SARS-CoV-2 RNA is generally detectable in upper and lower respiratory specimens during the acute phase of infection. Negative results do not preclude SARS-CoV-2 infection, do not rule out co-infections with other pathogens, and should not be used as the sole basis for treatment or other patient management decisions. Negative results must be combined with clinical observations, patient history, and epidemiological information. The expected result is Negative.  Fact Sheet for Patients: SugarRoll.be  Fact Sheet for Healthcare Providers: https://www.woods-mathews.com/  This  test is not yet approved or cleared by the [place] FDA and  has been authorized for detection and/or diagnosis of SARS-CoV-2 by FDA under an Emergency Use Authorization (EUA). This EUA will remain  in effect (meaning this test can be used) for the duration of the COVID-19 declaration under Se                          ction 564(b)(1) of the Act, 21 U.S.C. section 360bbb-3(b)(1), unless the authorization is terminated or revoked sooner.  Performed at [facility], [place] [address], [place], [place] [address]   . WBC 09/14/2020 7.8  4.0 - 10.5 K/uL Final  . RBC 09/14/2020 2.69* 3.87 - 5.11 MIL/uL Final  . Hemoglobin 09/14/2020 8.4* 12.0 - 15.0 g/dL Final  . HCT 09/14/2020 26.1* 36.0 - 46.0 % Final  . MCV 09/14/2020 97.0  80.0 - 100.0 fL Final  . MCH 09/14/2020 31.2  26.0 - 34.0 pg Final  . MCHC 09/14/2020 32.2  30.0 - 36.0 g/dL Final  . RDW 09/14/2020 13.9  11.5 - 15.5 % Final  . Platelets 09/14/2020 151  150 - 400 K/uL Final  . nRBC 09/14/2020 0.0  0.0 - 0.2 % Final   Performed at [facility], [place] [address] [place], [place] [address]  Hospital Outpatient Visit on 09/05/2020  Component Date Value Ref Range Status  . MRSA, PCR 09/05/2020 NEGATIVE  NEGATIVE Final  . Staphylococcus aureus 09/05/2020 NEGATIVE  NEGATIVE Final   Comment: (NOTE) The Xpert SA Assay (FDA approved for NASAL specimens in patients 38 years of age and older), is one component of a comprehensive surveillance program. It is not intended to diagnose infection nor to guide or monitor treatment. Performed at [facility], [place] [address], [place], [place] [address]   . WBC 09/05/2020 6.5  4.0 - 10.5 K/uL Final  . RBC 09/05/2020 4.33  3.87 - 5.11 MIL/uL Final  . Hemoglobin 09/05/2020 13.5  12.0 - 15.0 g/dL Final  . HCT 09/05/2020 41.1  36.0 - 46.0 % Final  . MCV 09/05/2020 94.9  80.0 - 100.0 fL Final  . MCH 09/05/2020 31.2  26.0 - 34.0 pg Final  . MCHC 09/05/2020  32.8  30.0 - 36.0 g/dL Final  .  RDW 09/05/2020 13.7  11.5 - 15.5 % Final  . Platelets 09/05/2020 261  150 - 400 K/uL Final  . nRBC 09/05/2020 0.0  0.0 - 0.2 % Final   Performed at [facility], [place] [address], [place], [place] [address]  . Sodium 09/05/2020 140  135 - 145 mmol/L Final  . Potassium 09/05/2020 3.9  3.5 - 5.1 mmol/L Final  . Chloride 09/05/2020 102  98 - 111 mmol/L Final  . CO2 09/05/2020 31  22 - 32 mmol/L Final  . Glucose, Bld 09/05/2020 106* 70 - 99 mg/dL Final   Glucose reference range applies only to samples taken after fasting for at least 8 hours.  . BUN 09/05/2020 13  8 - 23 mg/dL Final  . Creatinine, Ser 09/05/2020 0.56  0.44 - 1.00 mg/dL Final  . Calcium 09/05/2020 9.4  8.9 - 10.3 mg/dL Final  . Total Protein 09/05/2020 7.3  6.5 - 8.1 g/dL Final  . Albumin 09/05/2020 4.0  3.5 - 5.0 g/dL Final  . AST 09/05/2020 23  15 - 41 U/L Final  . ALT 09/05/2020 20  0 - 44 U/L Final  . Alkaline Phosphatase 09/05/2020 73  38 - 126 U/L Final  . Total Bilirubin 09/05/2020 0.2* 0.3 - 1.2 mg/dL Final  . GFR, Estimated 09/05/2020 >60  >60 mL/min Final   Comment: (NOTE) Calculated using the CKD-EPI Creatinine Equation (2021)   . Anion gap 09/05/2020 7  5 - 15 Final   Performed at [facility], [place] [address] [place], [place] [address]  . Prothrombin Time 09/05/2020 14.1  11.4 - 15.2 seconds Final  . INR 09/05/2020 1.1  0.8 - 1.2 Final   Comment: (NOTE) INR goal varies based on device and disease states. Performed at [facility], [place] [address] [place], [place] [address]      X-Rays:No results found.  EKG: Orders placed or performed in visit on 08/11/20  . EKG 12-Lead     Hospital Course: Cheryl Hinton is a 85 y.o. who was admitted to [facility]. They were brought to the operating room on 09/11/2020 and underwent Procedure(s): TOTAL KNEE ARTHROPLASTY.  Patient tolerated the procedure well and was  later transferred to the recovery room and then to the orthopaedic floor for postoperative care. They were given PO and IV analgesics for pain control following their surgery. They were given 24 hours of postoperative antibiotics of  Anti-infectives (From admission, onward)   Start     Dose/Rate Route Frequency Ordered Stop   09/12/20 1000  valACYclovir (VALTREX) tablet 500 mg        500 mg Oral Daily 09/11/20 1741     09/11/20 1900  ceFAZolin (ANCEF) IVPB 2g/100 mL premix        2 g 200 mL/hr over 30 Minutes Intravenous Every 6 hours 09/11/20 1741 09/12/20 0049   09/11/20 0945  ceFAZolin (ANCEF) IVPB 2g/100 mL premix        2 g 200 mL/hr over 30 Minutes Intravenous On call to O.R. 09/11/20 2130 09/11/20 1333     and started on DVT prophylaxis in the form of Xarelto.   PT and OT were ordered for total joint protocol. Discharge planning consulted to help with postop disposition and equipment needs. Patient had a decent night on the evening of surgery. They started to get up OOB with therapy on POD #1. Continued to work with therapy, patient had slower progression with ambulation and mobility. Dressing was  changed on day two and the Aquacel was dry and clean. She continued to work with therapy until POD #4, it was determined that SNF was the most appropriate discharge plan. She was discharged to the SNF within [place] later that day in stable condition  Diet: Regular diet Activity: WBAT Follow-up: in 2 weeks Disposition: SNF Discharged Condition: stable   Discharge Instructions    Call MD / Call 911   Complete by: As directed    If you experience chest pain or shortness of breath, CALL 911 and be transported to the hospital emergency room.  If you develope a fever above 101 F, pus (white drainage) or increased drainage or redness at the wound, or calf pain, call your surgeon's office.   Change dressing   Complete by: As directed    You may remove the bulky bandage (ACE wrap and gauze)  two days after surgery. You will have an adhesive waterproof bandage underneath. Leave this in place until your first follow-up appointment.   Constipation Prevention   Complete by: As directed    Drink plenty of fluids.  Prune juice may be helpful.  You may use a stool softener, such as Colace (over the counter) 100 mg twice a day.  Use MiraLax (over the counter) for constipation as needed.   Diet - low sodium heart healthy   Complete by: As directed    Do not put a pillow under the knee. Place it under the heel.   Complete by: As directed    Driving restrictions   Complete by: As directed    No driving for two weeks   Post-operative opioid taper instructions:   Complete by: As directed    POST-OPERATIVE OPIOID TAPER INSTRUCTIONS: It is important to wean off of your opioid medication as soon as possible. If you do not need pain medication after your surgery it is ok to stop day one. Opioids include: Codeine, Hydrocodone(Norco, Vicodin), Oxycodone(Percocet, oxycontin) and hydromorphone amongst others.  Long term and even short term use of opiods can cause: Increased pain response Dependence Constipation Depression Respiratory depression And more.  Withdrawal symptoms can include Flu like symptoms Nausea, vomiting And more Techniques to manage these symptoms Hydrate well Eat regular healthy meals Stay active Use relaxation techniques(deep breathing, meditating, yoga) Do Not substitute Alcohol to help with tapering If you have been on opioids for less than two weeks and do not have pain than it is ok to stop all together.  Plan to wean off of opioids This plan should start within one week post op of your joint replacement. Maintain the same interval or time between taking each dose and first decrease the dose.  Cut the total daily intake of opioids by one tablet each day Next start to increase the time between doses. The last dose that should be eliminated is the evening dose.       TED hose   Complete by: As directed    Use stockings (TED hose) for three weeks on both leg(s).  You may remove them at night for sleeping.   Weight bearing as tolerated   Complete by: As directed      Allergies as of 09/15/2020      Reactions   Duloxetine Hcl    Other reaction(s): blurry vision   Influenza Vaccines    Other reaction(s): "side effects"   Lisinopril    Other reaction(s): cough   Nsaids    Other reaction(s): gastritis   Other  Other reaction(s): GI upset   Praluent [alirocumab]    Muscle pains   Prednisone    Other reaction(s): Eye Swelling   Repatha [evolocumab]    itching   Statins    Other reaction(s): myalgias      Medication List    STOP taking these medications   aspirin 81 MG chewable tablet   oxyCODONE-acetaminophen 10-325 MG tablet Commonly known as: PERCOCET     TAKE these medications   ALPRAZolam 0.5 MG tablet Commonly known as: XANAX Take 0.25-0.5 mg by mouth at bedtime as needed for sleep.   CALCIUM PO Take 2 tablets by mouth daily.   EYE VITAMINS PO Take 1 tablet by mouth daily.   ezetimibe 10 MG tablet Commonly known as: ZETIA Take 1 tablet (10 mg total) by mouth daily.   Fish Oil 1200 MG Caps Take 1 capsule by mouth daily.   HYDROmorphone 2 MG tablet Commonly known as: DILAUDID Take 1-2 tablets (2-4 mg total) by mouth every 6 (six) hours as needed for moderate pain or severe pain.   hydrOXYzine 50 MG tablet Commonly known as: ATARAX/VISTARIL Take 50 mg by mouth at bedtime.   lidocaine 5 % Commonly known as: LIDODERM Place 1 patch onto the skin daily as needed (pain). Remove & Discard patch within 12 hours or as directed by MD   methocarbamol 500 MG tablet Commonly known as: ROBAXIN Take 1 tablet (500 mg total) by mouth every 6 (six) hours as needed for muscle spasms.   Narcan 4 MG/0.1ML Liqd nasal spray kit Generic drug: naloxone Place 4 mg into the nose as needed (overdose).   nebivolol 5 MG  tablet Commonly known as: Bystolic Take 1 tablet (5 mg total) by mouth daily.   pantoprazole 40 MG tablet Commonly known as: PROTONIX Take 40 mg by mouth daily.   polyvinyl alcohol 1.4 % ophthalmic solution Commonly known as: LIQUIFILM TEARS Place 1 drop into both eyes as needed for dry eyes.   potassium chloride 10 MEQ tablet Commonly known as: KLOR-CON Take 1 tablet (10 mEq total) by mouth 2 (two) times daily. What changed:   how much to take  when to take this   pregabalin 50 MG capsule Commonly known as: LYRICA Take 50 mg by mouth daily.   Red Yeast Rice 600 MG Caps Take 1,200 mg by mouth daily.   rivaroxaban 10 MG Tabs tablet Commonly known as: XARELTO Take 1 tablet (10 mg total) by mouth daily with breakfast for 17 days. Then resume one 81 mg aspirin once a day.   rosuvastatin 10 MG tablet Commonly known as: CRESTOR Take 10 mg by mouth once a week.   sertraline 50 MG tablet Commonly known as: ZOLOFT Take 50 mg by mouth daily.   sucralfate 1 GM/10ML suspension Commonly known as: CARAFATE Take 10 mLs by mouth 3 (three) times daily as needed (acid reflux).   telmisartan-hydrochlorothiazide 80-25 MG tablet Commonly known as: MICARDIS HCT TAKE 1 TABLET BY MOUTH ONCE DAILY   valACYclovir 500 MG tablet Commonly known as: VALTREX Take 500 mg by mouth daily.   vitamin C 500 MG tablet Commonly known as: ASCORBIC ACID Take 500 mg by mouth daily.   Vitamin D 50 MCG (2000 UT) tablet Take 2,000 Units by mouth daily.            Discharge Care Instructions  (From admission, onward)         Start     Ordered   09/13/20 0000  Weight  bearing as tolerated        09/13/20 0903   09/13/20 0000  Change dressing       Comments: You may remove the bulky bandage (ACE wrap and gauze) two days after surgery. You will have an adhesive waterproof bandage underneath. Leave this in place until your first follow-up appointment.   09/13/20 7903          Follow-up  Information    Gaynelle Arabian, MD. Go on 09/26/2020.   Specialty: Orthopedic Surgery Why: You are scheduled for first post op appointment on Tuesday May 3rd at 4:45pm. Contact information: [address] [place] [address] [phone]               Signed: Theresa Duty, PA-C Orthopedic Surgery 09/15/2020, 6:58 AM

## 2020-09-15 NOTE — Assessment & Plan Note (Signed)
IBS symptomatic management.

## 2020-09-15 NOTE — Assessment & Plan Note (Addendum)
Add Fe 325mg  po qd MWF, repeat CBC/diff. Hgb 8.4 09/14/20

## 2020-09-15 NOTE — Assessment & Plan Note (Signed)
Hyperlipidemia, takes Ezetimibe, Rosuvastatin. LDL 129 06/01/20. Update lipid panel.

## 2020-09-15 NOTE — Assessment & Plan Note (Addendum)
OA R+L knee, s/p TKR right, WBAT, in SNF [facility] for therapy, her goal is to return [facility] where she resides prior the surgery. Xarelto 17 days then ASA, prn Tylenol, Methocarbamol, Oxycodone/acetaminophen 10/325mg  q4hr prn x 5 days.

## 2020-09-15 NOTE — Assessment & Plan Note (Signed)
Hypokalemia, K 3.4 09/13/20, taking KCL, update CMP/eGFR

## 2020-09-18 ENCOUNTER — Encounter: Payer: Self-pay | Admitting: Nurse Practitioner

## 2020-09-18 DIAGNOSIS — M79606 Pain in leg, unspecified: Secondary | ICD-10-CM | POA: Insufficient documentation

## 2020-09-18 DIAGNOSIS — M545 Low back pain, unspecified: Secondary | ICD-10-CM | POA: Insufficient documentation

## 2020-09-18 NOTE — Assessment & Plan Note (Signed)
Uses Oxycodone/acetaminophen 10/325 q 4hr prn for pain in the lower back, sciatica R+L, left shoulder pain, and pain in knees, f/u pain clinic. Will have Oxycodone/acetaminophen 10/325mg  q4h prn x 5 days, Narcan available to her. Continue Lyrica, Methocarbamol prn.

## 2020-09-19 ENCOUNTER — Encounter: Payer: Self-pay | Admitting: Internal Medicine

## 2020-09-19 ENCOUNTER — Encounter: Payer: Self-pay | Admitting: Nurse Practitioner

## 2020-09-19 ENCOUNTER — Non-Acute Institutional Stay (SKILLED_NURSING_FACILITY): Payer: Medicare PPO | Admitting: Internal Medicine

## 2020-09-19 DIAGNOSIS — Z96651 Presence of right artificial knee joint: Secondary | ICD-10-CM | POA: Diagnosis not present

## 2020-09-19 DIAGNOSIS — I10 Essential (primary) hypertension: Secondary | ICD-10-CM | POA: Diagnosis not present

## 2020-09-19 DIAGNOSIS — K22719 Barrett's esophagus with dysplasia, unspecified: Secondary | ICD-10-CM | POA: Diagnosis not present

## 2020-09-19 DIAGNOSIS — G8929 Other chronic pain: Secondary | ICD-10-CM

## 2020-09-19 DIAGNOSIS — D500 Iron deficiency anemia secondary to blood loss (chronic): Secondary | ICD-10-CM

## 2020-09-19 DIAGNOSIS — E782 Mixed hyperlipidemia: Secondary | ICD-10-CM

## 2020-09-19 MED ORDER — OXYCODONE-ACETAMINOPHEN 10-325 MG PO TABS: 1.0000 | ORAL_TABLET | ORAL | 0 refills | Status: DC | PRN

## 2020-09-19 NOTE — Progress Notes (Signed)
Provider:  Veleta Miners MD Location:   [facility] Room Number: 34 Place of Service:  SNF (31)  PCP: Lawerance Cruel, MD Patient Care Team: Lawerance Cruel, MD as PCP - General (Family Medicine) Nahser, Wonda Cheng, MD as PCP - Cardiology (Cardiology)  Extended Emergency Contact Information Primary Emergency Contact: Nimmons Phone: [phone] Mobile Phone: [phone] Relation: Nephew Secondary Emergency Contact: Olegario Messier Mobile Phone: [phone] Relation: Friend  Code Status: Full Code, Managed Care Goals of Care: Advanced Directive information Advanced Directives 09/19/2020  Does Patient Have a Medical Advance Directive? Yes  Type of Advance Directive Living will;Healthcare Power of Attorney  Does patient want to make changes to medical advance directive? No - Patient declined  Copy of [place] in Chart? Yes - validated most recent copy scanned in chart (See row information)      Chief Complaint  Patient presents with  . New Admit To SNF    Admission to SNF    HPI: Patient is a 85 y.o. female seen today for admission to SNF  Patient was in the hospital from 4/18-4/22 for Right TKR for her Chronic Arthritis  Has h/o GERD with Stricture s/o Dilatation Hypertension,  HLD, is intolerant to higher doses Statins Chronic Pain on High Doses of Percocet at home due to arthritis and Chronic Shoulder and Back pain Depression with Anxiety  She is SNF for therapy Plans to go back to her Apartment Doing well. Already WBAT walking with her Gilford Rile. Her only issue is Pain Control. Is on High doses of Percocet at home Wants to continue that here also.    Past Medical History:  Diagnosis Date  . Anemia    Prior to hysterectomy  . Anxiety   . Arthritis   . Barrett's esophagus   . Colon polyps 1985  . Diverticulosis   . Endometriosis   . Esophageal stricture   . GERD (gastroesophageal reflux disease)   .  Hx of migraine headaches   . Hyperlipidemia   . Hypertension   . IBS (irritable bowel syndrome)   . Pneumonia   . Status post dilation of esophageal narrowing   . Urticaria    Past Surgical History:  Procedure Laterality Date  . ADENOIDECTOMY    . APPENDECTOMY  1956  . BREAST BIOPSY Left   . CATARACT EXTRACTION W/ INTRAOCULAR LENS IMPLANT    . COLONOSCOPY    . ESOPHAGEAL MANOMETRY N/A 01/13/2017   Procedure: ESOPHAGEAL MANOMETRY (EM);  Surgeon: Mauri Pole, MD;  Location: [facility];  Service: Endoscopy;  Laterality: N/A;  . ESOPHAGOGASTRODUODENOSCOPY ENDOSCOPY    . FOOT SURGERY Bilateral    tumor removed  . HEMORROIDECTOMY    . KNEE SURGERY Bilateral [phone]   x 3  . PARTIAL HYSTERECTOMY    . TONSILLECTOMY    . TONSILLECTOMY    . TOTAL ABDOMINAL HYSTERECTOMY     for endometriosis  . TOTAL KNEE ARTHROPLASTY Right 09/11/2020   Procedure: TOTAL KNEE ARTHROPLASTY;  Surgeon: Gaynelle Arabian, MD;  Location: [facility];  Service: Orthopedics;  Laterality: Right;  46mn    reports that she quit smoking about 54 years ago. Her smoking use included cigarettes. She has a 5.00 pack-year smoking history. She has never used smokeless tobacco. She reports that she does not drink alcohol and does not use drugs. Social History   Socioeconomic History  . Marital status: Widowed    Spouse name: Not on file  . Number of children:  1  . Years of education: Not on file  . Highest education level: Not on file  Occupational History  . Occupation: RETIRED    Employer: RETIRED  Tobacco Use  . Smoking status: Former Smoker    Packs/day: 0.50    Years: 10.00    Pack years: 5.00    Types: Cigarettes    Quit date: 05/27/1966    Years since quitting: 54.3  . Smokeless tobacco: Never Used  Vaping Use  . Vaping Use: Never used  Substance and Sexual Activity  . Alcohol use: No  . Drug use: No  . Sexual activity: Not Currently  Other Topics Concern  . Not on file  Social History  Narrative  . Not on file   Social Determinants of Health   Financial Resource Strain: Not on file  Food Insecurity: Not on file  Transportation Needs: Not on file  Physical Activity: Not on file  Stress: Not on file  Social Connections: Not on file  Intimate Partner Violence: Not on file    Functional Status Survey:    Family History  Problem Relation Age of Onset  . Heart disease Father   . Heart failure Sister   . Allergic rhinitis Sister   . Allergic rhinitis Brother   . Colon cancer Mother   . Diabetes Mother   . Allergic rhinitis Mother     Health Maintenance  Topic Date Due  . DEXA SCAN  Never done  . PNA vac Low Risk Adult (2 of 2 - PPSV23) 07/07/2015  . INFLUENZA VACCINE  12/25/2020  . TETANUS/TDAP  08/08/2025  . COVID-19 Vaccine  Completed  . HPV VACCINES  Aged Out    Allergies  Allergen Reactions  . Duloxetine Hcl     Other reaction(s): blurry vision  . Influenza Vaccines     Other reaction(s): "side effects"  . Lisinopril     Other reaction(s): cough  . Nsaids     Other reaction(s): gastritis  . Other     Other reaction(s): GI upset  . Praluent [Alirocumab]     Muscle pains  . Prednisone     Other reaction(s): Eye Swelling  . Repatha [Evolocumab]     itching  . Statins     Other reaction(s): myalgias    Allergies as of 09/19/2020      Reactions   Duloxetine Hcl    Other reaction(s): blurry vision   Influenza Vaccines    Other reaction(s): "side effects"   Lisinopril    Other reaction(s): cough   Nsaids    Other reaction(s): gastritis   Other    Other reaction(s): GI upset   Praluent [alirocumab]    Muscle pains   Prednisone    Other reaction(s): Eye Swelling   Repatha [evolocumab]    itching   Statins    Other reaction(s): myalgias      Medication List       Accurate as of September 19, 2020 11:38 AM. If you have any questions, ask your nurse or doctor.        STOP taking these medications   HYDROmorphone 2 MG  tablet Commonly known as: DILAUDID Stopped by: Virgie Dad, MD   potassium chloride 10 MEQ tablet Commonly known as: KLOR-CON Stopped by: Virgie Dad, MD     TAKE these medications   acetaminophen 325 MG tablet Commonly known as: TYLENOL Take 650 mg by mouth every 4 (four) hours as needed.   ALPRAZolam 0.5 MG tablet Commonly  known as: XANAX Take 0.25-0.5 mg by mouth at bedtime as needed for sleep.   aspirin 81 MG chewable tablet Chew by mouth daily.   CALCIUM PO Take 2 tablets by mouth daily.   ezetimibe 10 MG tablet Commonly known as: ZETIA Take 1 tablet (10 mg total) by mouth daily.   ferrous sulfate 325 (65 FE) MG tablet Take 325 mg by mouth. Once A Day on Mon, Wed, Fri   Fish Oil 1200 MG Caps Take 1 capsule by mouth daily.   hydrOXYzine 50 MG tablet Commonly known as: ATARAX/VISTARIL Take 50 mg by mouth at bedtime.   methocarbamol 500 MG tablet Commonly known as: ROBAXIN Take 1 tablet (500 mg total) by mouth every 6 (six) hours as needed for muscle spasms.   Narcan 4 MG/0.1ML Liqd nasal spray kit Generic drug: naloxone Place 4 mg into the nose as needed (overdose).   nebivolol 5 MG tablet Commonly known as: Bystolic Take 1 tablet (5 mg total) by mouth daily.   Ocuvite Adult 50+ Caps Take 1 capsule by mouth daily.   oxyCODONE 5 MG immediate release tablet Commonly known as: Oxy IR/ROXICODONE Take 5 mg by mouth every 6 (six) hours as needed for severe pain.   oxyCODONE-acetaminophen 10-325 MG tablet Commonly known as: PERCOCET Take 1 tablet by mouth every 4 (four) hours as needed for pain.   pantoprazole 40 MG tablet Commonly known as: PROTONIX Take 40 mg by mouth daily.   polyvinyl alcohol 1.4 % ophthalmic solution Commonly known as: LIQUIFILM TEARS Place 1 drop into both eyes as needed for dry eyes.   potassium chloride 10 MEQ tablet Commonly known as: KLOR-CON Take 10 mEq by mouth 2 (two) times daily.   pregabalin 50 MG  capsule Commonly known as: LYRICA Take 50 mg by mouth daily.   Red Yeast Rice 600 MG Caps Take 1,200 mg by mouth daily.   rivaroxaban 10 MG Tabs tablet Commonly known as: XARELTO Take 1 tablet (10 mg total) by mouth daily with breakfast for 17 days. Then resume one 81 mg aspirin once a day.   rosuvastatin 10 MG tablet Commonly known as: CRESTOR Take 10 mg by mouth once a week.   sertraline 50 MG tablet Commonly known as: ZOLOFT Take 50 mg by mouth daily.   sucralfate 1 GM/10ML suspension Commonly known as: CARAFATE Take 10 mLs by mouth 3 (three) times daily as needed (acid reflux).   telmisartan-hydrochlorothiazide 80-25 MG tablet Commonly known as: MICARDIS HCT TAKE 1 TABLET BY MOUTH ONCE DAILY   valACYclovir 500 MG tablet Commonly known as: VALTREX Take 500 mg by mouth daily.   vitamin C 500 MG tablet Commonly known as: ASCORBIC ACID Take 500 mg by mouth daily.   Vitamin D 50 MCG (2000 UT) tablet Take 2,000 Units by mouth daily.       Review of Systems  Review of Systems  Constitutional: Negative for activity change, appetite change, chills, diaphoresis, fatigue and fever.  HENT: Negative for mouth sores, postnasal drip, rhinorrhea, sinus pain and sore throat.   Respiratory: Negative for apnea, cough, chest tightness, shortness of breath and wheezing.   Cardiovascular: Negative for chest pain, palpitations and leg swelling.  Gastrointestinal: Negative for abdominal distention, abdominal pain, constipation, diarrhea, nausea and vomiting.  Genitourinary: Negative for dysuria and frequency.  Musculoskeletal: Positive  for arthralgias,and myalgias.  Skin: Negative for rash.  Neurological: Negative for dizziness, syncope, weakness, light-headedness and numbness.  Psychiatric/Behavioral: Negative for behavioral problems, confusion and sleep disturbance.  Vitals:   09/19/20 1056  BP: (!) 150/83  Pulse: 75  Resp: 20  Temp: 97.6 F (36.4 C)  SpO2: 98%   Weight: 154 lb 3.2 oz (69.9 kg)  Height: 5' 3"  (1.6 m)   Body mass index is 27.32 kg/m. Physical Exam  Constitutional: Oriented to person, place, and time. Well-developed and well-nourished.  HENT:  Head: Normocephalic.  Mouth/Throat: Oropharynx is clear and moist.  Eyes: Pupils are equal, round, and reactive to light.  Neck: Neck supple.  Cardiovascular: Normal rate and normal heart sounds.  No murmur heard. Pulmonary/Chest: Effort normal and breath sounds normal. No respiratory distress. No wheezes. She has no rales.  Abdominal: Soft. Bowel sounds are normal. No distension. There is no tenderness. There is no rebound.  Musculoskeletal: Mild Edema in Right Leg No Tenderness  Lymphadenopathy: none Neurological: Alert and oriented to person, place, and time.  Already Walking with her walker Skin: Skin is warm and dry.  Psychiatric: Normal mood and affect. Behavior is normal. Thought content normal.    Labs reviewed: Basic Metabolic Panel: Recent Labs    09/05/20 1515 09/12/20 0302 09/13/20 0254  NA 140 140 136  K 3.9 3.1* 3.4*  CL 102 103 100  CO2 31 32 26  GLUCOSE 106* 128* 111*  BUN 13 9 9   CREATININE 0.56 0.57 0.50  CALCIUM 9.4 7.8* 8.3*   Liver Function Tests: Recent Labs    03/07/20 1121 06/01/20 1145 09/05/20 1515  AST 19 21 23   ALT 16 13 20   ALKPHOS 96 100 73  BILITOT 0.3 0.4 0.2*  PROT 6.5 7.1 7.3  ALBUMIN 4.1 4.3 4.0   No results for input(s): LIPASE, AMYLASE in the last 8760 hours. No results for input(s): AMMONIA in the last 8760 hours. CBC: Recent Labs    09/12/20 0302 09/13/20 0254 09/14/20 0302  WBC 7.6 9.1 7.8  HGB 9.3* 9.1* 8.4*  HCT 28.7* 28.2* 26.1*  MCV 96.3 94.9 97.0  PLT 159 176 151   Cardiac Enzymes: No results for input(s): CKTOTAL, CKMB, CKMBINDEX, TROPONINI in the last 8760 hours. BNP: Invalid input(s): POCBNP No results found for: HGBA1C Lab Results  Component Value Date   TSH 1.78 01/10/2009   Lab Results   Component Value Date   VITAMINB12 >1500 pg/mL (H) 01/10/2009   Lab Results  Component Value Date   FOLATE >20.0 ng/mL 01/10/2009   Lab Results  Component Value Date   IRON 88 01/10/2009   FERRITIN 15.4 01/10/2009    Imaging and Procedures obtained prior to SNF admission: No results found.  Assessment/Plan  Blood loss anemia Started on Iron Repeat CBC Pending Status post total right knee replacement On Xarelto for DVT prophlaxis for # weeks  Chronic pain Restart her on Percocet 10 mg Q 4 PRN for pain Also on Lyrica, Robaxin Mixed hyperlipidemia On Crestor and Zetia Barrett's esophagus with dysplasia On Carafate, Protonix and  Essential hypertension On Bysytolic, Ans Micardis Depression and Anxiety On Zoloft and Xanax   Family/ staff Communication:   Labs/tests ordered:

## 2020-09-26 ENCOUNTER — Other Ambulatory Visit: Payer: Self-pay | Admitting: Orthopedic Surgery

## 2020-09-26 DIAGNOSIS — G8929 Other chronic pain: Secondary | ICD-10-CM

## 2020-09-26 MED ORDER — OXYCODONE-ACETAMINOPHEN 10-325 MG PO TABS: 1.0000 | ORAL_TABLET | ORAL | 0 refills | Status: DC | PRN

## 2020-10-02 DIAGNOSIS — R29898 Other symptoms and signs involving the musculoskeletal system: Secondary | ICD-10-CM | POA: Diagnosis not present

## 2020-10-02 DIAGNOSIS — M25661 Stiffness of right knee, not elsewhere classified: Secondary | ICD-10-CM | POA: Diagnosis not present

## 2020-10-02 DIAGNOSIS — R2681 Unsteadiness on feet: Secondary | ICD-10-CM | POA: Diagnosis not present

## 2020-10-02 DIAGNOSIS — M170 Bilateral primary osteoarthritis of knee: Secondary | ICD-10-CM | POA: Diagnosis not present

## 2020-10-02 DIAGNOSIS — M6281 Muscle weakness (generalized): Secondary | ICD-10-CM | POA: Diagnosis not present

## 2020-10-02 DIAGNOSIS — Z96651 Presence of right artificial knee joint: Secondary | ICD-10-CM | POA: Diagnosis not present

## 2020-10-05 DIAGNOSIS — R2681 Unsteadiness on feet: Secondary | ICD-10-CM | POA: Diagnosis not present

## 2020-10-05 DIAGNOSIS — M170 Bilateral primary osteoarthritis of knee: Secondary | ICD-10-CM | POA: Diagnosis not present

## 2020-10-05 DIAGNOSIS — M25661 Stiffness of right knee, not elsewhere classified: Secondary | ICD-10-CM | POA: Diagnosis not present

## 2020-10-05 DIAGNOSIS — M6281 Muscle weakness (generalized): Secondary | ICD-10-CM | POA: Diagnosis not present

## 2020-10-05 DIAGNOSIS — Z96651 Presence of right artificial knee joint: Secondary | ICD-10-CM | POA: Diagnosis not present

## 2020-10-05 DIAGNOSIS — R29898 Other symptoms and signs involving the musculoskeletal system: Secondary | ICD-10-CM | POA: Diagnosis not present

## 2020-10-09 DIAGNOSIS — M170 Bilateral primary osteoarthritis of knee: Secondary | ICD-10-CM | POA: Diagnosis not present

## 2020-10-09 DIAGNOSIS — M6281 Muscle weakness (generalized): Secondary | ICD-10-CM | POA: Diagnosis not present

## 2020-10-09 DIAGNOSIS — Z96651 Presence of right artificial knee joint: Secondary | ICD-10-CM | POA: Diagnosis not present

## 2020-10-09 DIAGNOSIS — R29898 Other symptoms and signs involving the musculoskeletal system: Secondary | ICD-10-CM | POA: Diagnosis not present

## 2020-10-09 DIAGNOSIS — R2681 Unsteadiness on feet: Secondary | ICD-10-CM | POA: Diagnosis not present

## 2020-10-09 DIAGNOSIS — M25661 Stiffness of right knee, not elsewhere classified: Secondary | ICD-10-CM | POA: Diagnosis not present

## 2020-10-12 DIAGNOSIS — Z96651 Presence of right artificial knee joint: Secondary | ICD-10-CM | POA: Diagnosis not present

## 2020-10-12 DIAGNOSIS — M6281 Muscle weakness (generalized): Secondary | ICD-10-CM | POA: Diagnosis not present

## 2020-10-12 DIAGNOSIS — M25661 Stiffness of right knee, not elsewhere classified: Secondary | ICD-10-CM | POA: Diagnosis not present

## 2020-10-12 DIAGNOSIS — R2681 Unsteadiness on feet: Secondary | ICD-10-CM | POA: Diagnosis not present

## 2020-10-12 DIAGNOSIS — R29898 Other symptoms and signs involving the musculoskeletal system: Secondary | ICD-10-CM | POA: Diagnosis not present

## 2020-10-12 DIAGNOSIS — M170 Bilateral primary osteoarthritis of knee: Secondary | ICD-10-CM | POA: Diagnosis not present

## 2020-10-17 DIAGNOSIS — Z96651 Presence of right artificial knee joint: Secondary | ICD-10-CM | POA: Diagnosis not present

## 2020-10-17 DIAGNOSIS — Z471 Aftercare following joint replacement surgery: Secondary | ICD-10-CM | POA: Diagnosis not present

## 2020-10-19 DIAGNOSIS — G894 Chronic pain syndrome: Secondary | ICD-10-CM | POA: Diagnosis not present

## 2020-10-19 DIAGNOSIS — Z79891 Long term (current) use of opiate analgesic: Secondary | ICD-10-CM | POA: Diagnosis not present

## 2020-10-19 DIAGNOSIS — M25561 Pain in right knee: Secondary | ICD-10-CM | POA: Diagnosis not present

## 2020-10-19 DIAGNOSIS — M4726 Other spondylosis with radiculopathy, lumbar region: Secondary | ICD-10-CM | POA: Diagnosis not present

## 2020-11-14 ENCOUNTER — Encounter: Payer: Self-pay | Admitting: Cardiovascular Disease

## 2020-11-14 NOTE — Progress Notes (Signed)
Cardiology Office Note:    Date:  11/15/2020   ID:  Cheryl Hinton, DOB 1935-06-14, MRN [id number]  PCP:  Daisy Floro, MD  Cardiologist: Deaira Leckey  Electrophysiologist:  None   Referring MD: Catha Gosselin, MD   Chief Complaint  Patient presents with   Hypertension    Previous notes:    Jan. 14, 2021 Cheryl Hinton is a 85 y.o. female with a hx of hypertension and hyperlipidemia.   I saw her years ago for HTN.  She has been on telmesartan  She was recently in the hospital with a headache and was found to have hypertension.  CT scan of the brain was negative . Potassium was low.  She was given amlodipine and extra potassium supplement for that day only  and was told to follow-up with [place].    We are asked to see her today by Dr. Catha Gosselin for further evaluation and management of her hypertension.  Has been eating more salt.   Organic popcorn recently .  Does not eat meat for the most part. Grilled chicken on occasion .   I saw her back in August, 2013 for shortness of breath.  I put her on Lisinopril years ago and she did very well.  Echocardiogram at that time was essentially normal.  Ejection fraction was 55 to 60%.  Myoview study was negative for ischemia and showed normal left ventricular systolic function.  August 19, 2019: Cheryl Hinton is seen today for a follow-up visit regarding her hypertension and leg edema. Her last echocardiogram in 2013 reveals normal left ventricular systolic function.  She did have left ventricular hypertrophy.  Has greatly reduced her salt intake and her BP and leg edema have resolved.  Was eating lots of organic popcorn and using lots of table salt   Sees "stars" when she turns her head to the side Carotid duplex in 2019 showed mild plaque.   She has had hyperlipidemia .   Has tried multiple statins .   Has tried niacin,lipitor,   Total cholesterol is 248.  The HDL is 49.  The LDL is 181.  She is willing to try rosuvastatin. We  discussed the fact that she has tried multiple statins in the past and did not tolerate them.  She also has tried niacin and could not tolerate that.  With her elevated LDL I would have a very low threshold to refer her to the lipid clinic for consideration for PCSK9 inhibitor if she does not tolerate the rosuvastatin.  August 11, 2020: Cheryl Hinton is seen today for follow-up of her hypertension and leg edema.  She has normal left ventricular systolic function.  She does have LVH. Needs to have Right knee replacement  She is at low risk for her upcoming knee replacement .  No CP , no dyspnea   Is only taking Rosuvastatin 10 mg a week - higher doses make her muscle ache  Is on zetia 10 mg a day    November 15, 2020: Cheryl Hinton is seen today for follow up her HTN, leg edema  Seems to be doing well Has some / rare palpitations  Causes her to take a deep breath  Has had for years  Swims 2 days a week Has exercise class twice a week.   We discussed having her wear an event monitor .  She wore a Holter Monitor 20+ years ago  She does not think its severe enough to warrant this at this point   Past Medical  History:  Diagnosis Date   Anemia    Prior to hysterectomy   Anxiety    Arthritis    Barrett's esophagus    Colon polyps 1985   Diverticulosis    Endometriosis    Esophageal stricture    GERD (gastroesophageal reflux disease)    Hx of migraine headaches    Hyperlipidemia    Hypertension    IBS (irritable bowel syndrome)    Pneumonia    Status post dilation of esophageal narrowing    Urticaria     Past Surgical History:  Procedure Laterality Date   ADENOIDECTOMY     APPENDECTOMY  1956   BREAST BIOPSY Left    CATARACT EXTRACTION W/ INTRAOCULAR LENS IMPLANT     COLONOSCOPY     ESOPHAGEAL MANOMETRY N/A 01/13/2017   Procedure: ESOPHAGEAL MANOMETRY (EM);  Surgeon: Napoleon FormNandigam, Kavitha V, MD;  Location: [facility];  Service: Endoscopy;  Laterality: N/A;   ESOPHAGOGASTRODUODENOSCOPY  ENDOSCOPY     FOOT SURGERY Bilateral    tumor removed   HEMORROIDECTOMY     KNEE SURGERY Bilateral [id number]   x 3   PARTIAL HYSTERECTOMY     TONSILLECTOMY     TONSILLECTOMY     TOTAL ABDOMINAL HYSTERECTOMY     for endometriosis   TOTAL KNEE ARTHROPLASTY Right 09/11/2020   Procedure: TOTAL KNEE ARTHROPLASTY;  Surgeon: Ollen GrossAluisio, Frank, MD;  Location: [facility];  Service: Orthopedics;  Laterality: Right;  50min    Current Medications: Current Meds  Medication Sig   ALPRAZolam (XANAX) 0.5 MG tablet Take 0.25-0.5 mg by mouth at bedtime as needed for sleep.   aspirin 81 MG chewable tablet Chew by mouth daily.   CALCIUM PO Take 2 tablets by mouth daily.   Cholecalciferol (VITAMIN D) 50 MCG (2000 UT) tablet Take 2,000 Units by mouth daily.   ezetimibe (ZETIA) 10 MG tablet Take 1 tablet (10 mg total) by mouth daily.   hydrOXYzine (ATARAX/VISTARIL) 50 MG tablet Take 50 mg by mouth at bedtime.   methocarbamol (ROBAXIN) 500 MG tablet Take 1 tablet (500 mg total) by mouth every 6 (six) hours as needed for muscle spasms.   Multiple Vitamins-Minerals (OCUVITE ADULT 50+) CAPS Take 1 capsule by mouth daily.   nebivolol (BYSTOLIC) 5 MG tablet Take 1 tablet (5 mg total) by mouth daily.   Omega-3 Fatty Acids (FISH OIL) 1200 MG CAPS Take 1 capsule by mouth daily.   oxyCODONE-acetaminophen (PERCOCET) 10-325 MG tablet Take 1 tablet by mouth every 4 (four) hours as needed for pain.   pantoprazole (PROTONIX) 40 MG tablet Take 40 mg by mouth daily.   polyvinyl alcohol (LIQUIFILM TEARS) 1.4 % ophthalmic solution Place 1 drop into both eyes as needed for dry eyes.   potassium chloride (KLOR-CON) 10 MEQ tablet Take 10 mEq by mouth 2 (two) times daily.   pregabalin (LYRICA) 50 MG capsule Take 50 mg by mouth daily.   Red Yeast Rice 600 MG CAPS Take 1,200 mg by mouth daily.   rosuvastatin (CRESTOR) 10 MG tablet Take 10 mg by mouth once a week.   sertraline (ZOLOFT) 50 MG tablet Take 50 mg by mouth daily.    sucralfate (CARAFATE) 1 GM/10ML suspension Take 10 mLs by mouth 3 (three) times daily as needed (acid reflux).   telmisartan-hydrochlorothiazide (MICARDIS HCT) 80-25 MG tablet TAKE 1 TABLET BY MOUTH ONCE DAILY   valACYclovir (VALTREX) 500 MG tablet Take 500 mg by mouth daily.   vitamin C (ASCORBIC ACID) 500 MG tablet Take 500 mg  by mouth daily.     Allergies:   Duloxetine hcl, Influenza vaccines, Lisinopril, Nsaids, Other, Praluent [alirocumab], Prednisone, Repatha [evolocumab], and Statins   Social History   Socioeconomic History   Marital status: Widowed    Spouse name: Not on file   Number of children: 1   Years of education: Not on file   Highest education level: Not on file  Occupational History   Occupation: RETIRED    Employer: RETIRED  Tobacco Use   Smoking status: Former    Packs/day: 0.50    Years: 10.00    Pack years: 5.00    Types: Cigarettes    Quit date: 05/27/1966    Years since quitting: 54.5   Smokeless tobacco: Never  Vaping Use   Vaping Use: Never used  Substance and Sexual Activity   Alcohol use: No   Drug use: No   Sexual activity: Not Currently  Other Topics Concern   Not on file  Social History Narrative   Not on file   Social Determinants of Health   Financial Resource Strain: Not on file  Food Insecurity: Not on file  Transportation Needs: Not on file  Physical Activity: Not on file  Stress: Not on file  Social Connections: Not on file     Family History: The patient's family history includes Allergic rhinitis in her brother, mother, and sister; Colon cancer in her mother; Diabetes in her mother; Heart disease in her father; Heart failure in her sister.  ROS:   Please see the history of present illness.     All other systems reviewed and are negative.  EKGs/Labs/Other Studies Reviewed:    The following studies were reviewed today:  EKG:   Recent Labs: 09/05/2020: ALT 20 09/13/2020: BUN 9; Creatinine, Ser 0.50; Potassium 3.4; Sodium  136 09/14/2020: Hemoglobin 8.4; Platelets 151  Recent Lipid Panel    Component Value Date/Time   CHOL 203 (H) 06/01/2020 1145   TRIG 82 06/01/2020 1145   HDL 59 06/01/2020 1145   CHOLHDL 3.4 06/01/2020 1145   LDLCALC 129 (H) 06/01/2020 1145    Physical Exam:     Physical Exam: Blood pressure 130/62, pulse 61, height 5' 3.5" (1.613 m), weight 155 lb 3.2 oz (70.4 kg), SpO2 97 %.  GEN:   elderly female,  NAD  HEENT: Normal NECK: No JVD; No carotid bruits LYMPHATICS: No lymphadenopathy CARDIAC:  RR, no murmur  RESPIRATORY:  Clear to auscultation without rales, wheezing or rhonchi  ABDOMEN: Soft, non-tender, non-distended MUSCULOSKELETAL:  no edema  SKIN: Warm and dry NEUROLOGIC:  Alert and oriented x 3     ASSESSMENT:    No diagnosis found.  PLAN:       Essential HTN:   BP is well controlled.  Cont current meds.   2.  Hyperlipidemia:  cont current meds.    3.   Leg edema :   no significant edema    Medication Adjustments/Labs and Tests Ordered: Current medicines are reviewed at length with the patient today.  Concerns regarding medicines are outlined above.  No orders of the defined types were placed in this encounter.  No orders of the defined types were placed in this encounter.   Patient Instructions  Medication Instructions:   Your physician recommends that you continue on your current medications as directed. Please refer to the Current Medication list given to you today.  *If you need a refill on your cardiac medications before your next appointment, please call your pharmacy*  Lab Work:  none If you have labs (blood work) drawn today and your tests are completely normal, you will receive your results only by: MyChart Message (if you have MyChart) OR A paper copy in the mail If you have any lab test that is abnormal or we need to change your treatment, we will call you to review the results.   Testing/Procedures:  none   Follow-Up: At  [facility], you and your health needs are our priority.  As part of our continuing mission to provide you with exceptional heart care, we have created designated Provider Care Teams.  These Care Teams include your primary Cardiologist (physician) and Advanced Practice Providers (APPs -  Physician Assistants and Nurse Practitioners) who all work together to provide you with the care you need, when you need it.  We recommend signing up for the patient portal called "MyChart".  Sign up information is provided on this After Visit Summary.  MyChart is used to connect with patients for Virtual Visits (Telemedicine).  Patients are able to view lab/test results, encounter notes, upcoming appointments, etc.  Non-urgent messages can be sent to your provider as well.   To learn more about what you can do with MyChart, go to [email or web address].    Your next appointment:   1 year(s)  The format for your next appointment:   In Person  Provider:   Kristeen Miss, MD   Signed, Kristeen Miss, MD  11/15/2020 5:23 PM    [facility]

## 2020-11-15 ENCOUNTER — Ambulatory Visit: Payer: Medicare PPO | Admitting: Cardiovascular Disease

## 2020-11-15 ENCOUNTER — Other Ambulatory Visit: Payer: Self-pay

## 2020-11-15 ENCOUNTER — Encounter: Payer: Self-pay | Admitting: Cardiovascular Disease

## 2020-11-15 VITALS — BP 130/62 | HR 61 | Ht 63.5 in | Wt 155.2 lb

## 2020-11-15 DIAGNOSIS — I10 Essential (primary) hypertension: Secondary | ICD-10-CM

## 2020-11-15 NOTE — Patient Instructions (Signed)
Medication Instructions:  Your physician recommends that you continue on your current medications as directed. Please refer to the Current Medication list given to you today.  *If you need a refill on your cardiac medications before your next appointment, please call your pharmacy*   Lab Work: none If you have labs (blood work) drawn today and your tests are completely normal, you will receive your results only by: MyChart Message (if you have MyChart) OR A paper copy in the mail If you have any lab test that is abnormal or we need to change your treatment, we will call you to review the results.   Testing/Procedures: none   Follow-Up: At [facility], you and your health needs are our priority.  As part of our continuing mission to provide you with exceptional heart care, we have created designated Provider Care Teams.  These Care Teams include your primary Cardiologist (physician) and Advanced Practice Providers (APPs -  Physician Assistants and Nurse Practitioners) who all work together to provide you with the care you need, when you need it.  We recommend signing up for the patient portal called "MyChart".  Sign up information is provided on this After Visit Summary.  MyChart is used to connect with patients for Virtual Visits (Telemedicine).  Patients are able to view lab/test results, encounter notes, upcoming appointments, etc.  Non-urgent messages can be sent to your provider as well.   To learn more about what you can do with MyChart, go to [email or web address].    Your next appointment:   1 year(s)  The format for your next appointment:   In Person  Provider:   Philip Nahser, MD   

## 2020-12-05 DIAGNOSIS — L820 Inflamed seborrheic keratosis: Secondary | ICD-10-CM | POA: Diagnosis not present

## 2020-12-05 DIAGNOSIS — D1801 Hemangioma of skin and subcutaneous tissue: Secondary | ICD-10-CM | POA: Diagnosis not present

## 2020-12-05 DIAGNOSIS — L814 Other melanin hyperpigmentation: Secondary | ICD-10-CM | POA: Diagnosis not present

## 2020-12-05 DIAGNOSIS — D692 Other nonthrombocytopenic purpura: Secondary | ICD-10-CM | POA: Diagnosis not present

## 2020-12-05 DIAGNOSIS — L570 Actinic keratosis: Secondary | ICD-10-CM | POA: Diagnosis not present

## 2020-12-05 DIAGNOSIS — L821 Other seborrheic keratosis: Secondary | ICD-10-CM | POA: Diagnosis not present

## 2020-12-07 ENCOUNTER — Other Ambulatory Visit: Payer: Self-pay | Admitting: Cardiovascular Disease

## 2020-12-18 DIAGNOSIS — R059 Cough, unspecified: Secondary | ICD-10-CM | POA: Diagnosis not present

## 2020-12-18 DIAGNOSIS — F329 Major depressive disorder, single episode, unspecified: Secondary | ICD-10-CM | POA: Diagnosis not present

## 2020-12-18 DIAGNOSIS — L299 Pruritus, unspecified: Secondary | ICD-10-CM | POA: Diagnosis not present

## 2020-12-18 DIAGNOSIS — J45909 Unspecified asthma, uncomplicated: Secondary | ICD-10-CM | POA: Diagnosis not present

## 2020-12-18 DIAGNOSIS — F419 Anxiety disorder, unspecified: Secondary | ICD-10-CM | POA: Diagnosis not present

## 2020-12-18 DIAGNOSIS — K222 Esophageal obstruction: Secondary | ICD-10-CM | POA: Diagnosis not present

## 2020-12-18 DIAGNOSIS — I10 Essential (primary) hypertension: Secondary | ICD-10-CM | POA: Diagnosis not present

## 2020-12-18 DIAGNOSIS — K219 Gastro-esophageal reflux disease without esophagitis: Secondary | ICD-10-CM | POA: Diagnosis not present

## 2020-12-26 DIAGNOSIS — M4726 Other spondylosis with radiculopathy, lumbar region: Secondary | ICD-10-CM | POA: Diagnosis not present

## 2020-12-26 DIAGNOSIS — Z79891 Long term (current) use of opiate analgesic: Secondary | ICD-10-CM | POA: Diagnosis not present

## 2020-12-26 DIAGNOSIS — G894 Chronic pain syndrome: Secondary | ICD-10-CM | POA: Diagnosis not present

## 2020-12-26 DIAGNOSIS — M25561 Pain in right knee: Secondary | ICD-10-CM | POA: Diagnosis not present

## 2021-01-07 ENCOUNTER — Other Ambulatory Visit: Payer: Self-pay | Admitting: Cardiovascular Disease

## 2021-02-16 DIAGNOSIS — M25532 Pain in left wrist: Secondary | ICD-10-CM | POA: Diagnosis not present

## 2021-02-20 DIAGNOSIS — Z79891 Long term (current) use of opiate analgesic: Secondary | ICD-10-CM | POA: Diagnosis not present

## 2021-02-20 DIAGNOSIS — G894 Chronic pain syndrome: Secondary | ICD-10-CM | POA: Diagnosis not present

## 2021-02-20 DIAGNOSIS — M25561 Pain in right knee: Secondary | ICD-10-CM | POA: Diagnosis not present

## 2021-02-20 DIAGNOSIS — M4726 Other spondylosis with radiculopathy, lumbar region: Secondary | ICD-10-CM | POA: Diagnosis not present

## 2021-05-10 ENCOUNTER — Other Ambulatory Visit: Payer: Self-pay | Admitting: Cardiovascular Disease

## 2021-05-11 MED ORDER — POTASSIUM CHLORIDE CRYS ER 10 MEQ PO TBCR: 10.0000 meq | EXTENDED_RELEASE_TABLET | Freq: Two times a day (BID) | ORAL | 1 refills | Status: DC

## 2021-05-17 DIAGNOSIS — J45909 Unspecified asthma, uncomplicated: Secondary | ICD-10-CM | POA: Diagnosis not present

## 2021-05-17 DIAGNOSIS — I10 Essential (primary) hypertension: Secondary | ICD-10-CM | POA: Diagnosis not present

## 2021-05-17 DIAGNOSIS — Z9660 Presence of unspecified orthopedic joint implant: Secondary | ICD-10-CM | POA: Diagnosis not present

## 2021-05-30 ENCOUNTER — Other Ambulatory Visit: Payer: Self-pay | Admitting: Family Medicine

## 2021-05-30 ENCOUNTER — Ambulatory Visit
Admission: RE | Admit: 2021-05-30 | Discharge: 2021-05-30 | Disposition: A | Discharge status: Home / Self Care | Payer: Medicare PPO | Source: Ambulatory Visit | Attending: Family Medicine | Admitting: Family Medicine

## 2021-05-30 DIAGNOSIS — R0602 Shortness of breath: Secondary | ICD-10-CM | POA: Diagnosis not present

## 2021-05-30 DIAGNOSIS — R059 Cough, unspecified: Secondary | ICD-10-CM | POA: Diagnosis not present

## 2021-05-30 DIAGNOSIS — R053 Chronic cough: Secondary | ICD-10-CM

## 2021-06-01 ENCOUNTER — Other Ambulatory Visit: Payer: Self-pay

## 2021-06-01 DIAGNOSIS — E785 Hyperlipidemia, unspecified: Secondary | ICD-10-CM

## 2021-06-08 ENCOUNTER — Other Ambulatory Visit: Payer: Medicare PPO | Admitting: *Deleted

## 2021-06-08 ENCOUNTER — Other Ambulatory Visit: Payer: Self-pay

## 2021-06-08 DIAGNOSIS — E785 Hyperlipidemia, unspecified: Secondary | ICD-10-CM

## 2021-06-09 LAB — LIPID PANEL
Chol/HDL Ratio: 3.9 ratio (ref 0.0–4.4)
Cholesterol, Total: 212 mg/dL — ABNORMAL HIGH (ref 100–199)
HDL: 55 mg/dL (ref >39)
LDL Chol Calc (NIH): 145 mg/dL — ABNORMAL HIGH (ref 0–99)
Triglycerides: 67 mg/dL (ref 0–149)
VLDL Cholesterol Cal: 12 mg/dL (ref 5–40)

## 2021-06-09 LAB — HEPATIC FUNCTION PANEL
ALT: 14 IU/L (ref 0–32)
AST: 23 IU/L (ref 0–40)
Albumin: 4.1 g/dL (ref 3.6–4.6)
Alkaline Phosphatase: 103 IU/L (ref 44–121)
Bilirubin Total: 0.4 mg/dL (ref 0.0–1.2)
Bilirubin, Direct: 0.11 mg/dL (ref 0.00–0.40)
Total Protein: 6.6 g/dL (ref 6.0–8.5)

## 2021-06-15 ENCOUNTER — Telehealth: Payer: Self-pay | Admitting: Cardiovascular Disease

## 2021-06-15 MED ORDER — EZETIMIBE 10 MG PO TABS: 10.0000 mg | ORAL_TABLET | Freq: Every day | ORAL | 3 refills | Status: DC

## 2021-06-15 MED ORDER — PRAVASTATIN SODIUM 40 MG PO TABS: 40.0000 mg | ORAL_TABLET | Freq: Every evening | ORAL | 3 refills | Status: DC

## 2021-06-15 NOTE — Telephone Encounter (Signed)
-----   Message from Thayer Headings, MD sent at 06/11/2021  2:35 PM EST ----- LDL is back up to 145.   It was 91 a year ago. I suspect she is not taking the rosuvastatin and perhaps not the Zetia Did she run out or not tolerate it  If she had a side effect, lets try pravachol 40 mg a day and see if that helps  Restart zetia -it typically does not cause side effects

## 2021-06-15 NOTE — Telephone Encounter (Signed)
The patient has been notified of the result and verbalized understanding.  All questions (if any) were answered.  Pt states she has not been taking her crestor, for it causes her muscle aches (never called the office to report side effects).  She states she ran out of refills on her zetia (never called to refill).  Advised the pt that per Dr. Acie Fredrickson, being she is not tolerating her crestor, he will discontinue that medication and start her on pravachol 40 mg po daily and she should continue her zetia regimen as is.  Pt request that we send in only a 30 day supply of her pravachol, to make sure she tolerates this appropriately, and if she does, then we can kick this back as a 90 day supply.   Confirmed the pharmacy of choice with the pt.  Pt is aware I will send in both her zetia and new med pravachol.  Reiterated to the pt the importance of letting us know if she ever has an issue with a medication we prescribe, she should always call us to inform us of this, so we can advise on possibly switching her to a more tolerable regimen.  Advised her to always remember to reach out to her pharmacy and have them fax [place] a refill request on any medication we prescribe.  Pt verbalized understanding and agrees with this plan.

## 2021-06-15 NOTE — Telephone Encounter (Signed)
Follow Up: ° ° ° ° °Patient is returning a call from today, concerning her lab results.  °

## 2021-06-18 DIAGNOSIS — M25561 Pain in right knee: Secondary | ICD-10-CM | POA: Diagnosis not present

## 2021-06-18 DIAGNOSIS — Z79891 Long term (current) use of opiate analgesic: Secondary | ICD-10-CM | POA: Diagnosis not present

## 2021-06-18 DIAGNOSIS — M4726 Other spondylosis with radiculopathy, lumbar region: Secondary | ICD-10-CM | POA: Diagnosis not present

## 2021-06-18 DIAGNOSIS — G894 Chronic pain syndrome: Secondary | ICD-10-CM | POA: Diagnosis not present

## 2021-06-22 DIAGNOSIS — R059 Cough, unspecified: Secondary | ICD-10-CM | POA: Diagnosis not present

## 2021-06-22 DIAGNOSIS — R062 Wheezing: Secondary | ICD-10-CM | POA: Diagnosis not present

## 2021-06-22 DIAGNOSIS — F419 Anxiety disorder, unspecified: Secondary | ICD-10-CM | POA: Diagnosis not present

## 2021-08-13 DIAGNOSIS — G894 Chronic pain syndrome: Secondary | ICD-10-CM | POA: Diagnosis not present

## 2021-08-13 DIAGNOSIS — M47812 Spondylosis without myelopathy or radiculopathy, cervical region: Secondary | ICD-10-CM | POA: Diagnosis not present

## 2021-08-13 DIAGNOSIS — Z79891 Long term (current) use of opiate analgesic: Secondary | ICD-10-CM | POA: Diagnosis not present

## 2021-08-13 DIAGNOSIS — M4726 Other spondylosis with radiculopathy, lumbar region: Secondary | ICD-10-CM | POA: Diagnosis not present

## 2021-08-31 DIAGNOSIS — M5451 Vertebrogenic low back pain: Secondary | ICD-10-CM | POA: Diagnosis not present

## 2021-09-10 ENCOUNTER — Telehealth: Payer: Self-pay

## 2021-09-10 DIAGNOSIS — E785 Hyperlipidemia, unspecified: Secondary | ICD-10-CM

## 2021-09-10 NOTE — Telephone Encounter (Signed)
Routing back to myself to follow up 

## 2021-09-10 NOTE — Telephone Encounter (Signed)
Pt called and reported that they are taking half tab of pravastatin and some red yeast rice. Scheduled lipid panel for 4/18 ?

## 2021-09-10 NOTE — Telephone Encounter (Signed)
-----   Message from Olene Floss, RPH-CPP sent at 09/10/2021  7:31 AM EDT ----- ?Please see if she is still taking pravastatin. If she is, please set up lipid panel and apob. thanks ?----- Message ----- ?From: Olene Floss, RPH-CPP ?Sent: 09/07/2021  12:00 AM EDT ?To: Olene Floss, RPH-CPP ? ?Set up repeat lipids ? ? ?

## 2021-09-10 NOTE — Addendum Note (Signed)
Addended by: Eather Colas on: 09/10/2021 02:05 PM ? ? Modules accepted: Orders ? ?

## 2021-09-10 NOTE — Telephone Encounter (Signed)
Called and lmomed the pt to please call us  ?

## 2021-09-11 ENCOUNTER — Other Ambulatory Visit: Payer: Medicare PPO

## 2021-10-11 DIAGNOSIS — M47812 Spondylosis without myelopathy or radiculopathy, cervical region: Secondary | ICD-10-CM | POA: Diagnosis not present

## 2021-10-11 DIAGNOSIS — M4726 Other spondylosis with radiculopathy, lumbar region: Secondary | ICD-10-CM | POA: Diagnosis not present

## 2021-10-11 DIAGNOSIS — G894 Chronic pain syndrome: Secondary | ICD-10-CM | POA: Diagnosis not present

## 2021-10-11 DIAGNOSIS — Z79891 Long term (current) use of opiate analgesic: Secondary | ICD-10-CM | POA: Diagnosis not present

## 2021-10-24 ENCOUNTER — Other Ambulatory Visit (HOSPITAL_COMMUNITY): Payer: Self-pay

## 2021-10-24 MED ORDER — OXYCODONE-ACETAMINOPHEN 10-325 MG PO TABS
ORAL_TABLET | ORAL | 0 refills | Status: DC
  Filled 2021-10-24: qty 120, 30d supply, fill #0

## 2021-10-25 ENCOUNTER — Other Ambulatory Visit (HOSPITAL_COMMUNITY): Payer: Self-pay

## 2021-10-29 DIAGNOSIS — Z23 Encounter for immunization: Secondary | ICD-10-CM | POA: Diagnosis not present

## 2021-10-29 DIAGNOSIS — Z0000 Encounter for general adult medical examination without abnormal findings: Secondary | ICD-10-CM | POA: Diagnosis not present

## 2021-10-29 DIAGNOSIS — I10 Essential (primary) hypertension: Secondary | ICD-10-CM | POA: Diagnosis not present

## 2021-10-29 DIAGNOSIS — H6123 Impacted cerumen, bilateral: Secondary | ICD-10-CM | POA: Diagnosis not present

## 2021-11-14 ENCOUNTER — Other Ambulatory Visit: Payer: Self-pay | Admitting: Cardiovascular Disease

## 2021-11-22 ENCOUNTER — Other Ambulatory Visit (HOSPITAL_COMMUNITY): Payer: Self-pay

## 2021-11-22 MED ORDER — OXYCODONE-ACETAMINOPHEN 10-325 MG PO TABS
ORAL_TABLET | ORAL | 0 refills | Status: AC
  Filled 2021-11-22: qty 120, 30d supply, fill #0

## 2021-11-30 ENCOUNTER — Ambulatory Visit: Payer: Medicare PPO | Admitting: Cardiovascular Disease

## 2021-11-30 ENCOUNTER — Other Ambulatory Visit: Payer: Medicare PPO | Admitting: *Deleted

## 2021-11-30 DIAGNOSIS — E785 Hyperlipidemia, unspecified: Secondary | ICD-10-CM | POA: Diagnosis not present

## 2021-11-30 LAB — LIPID PANEL
Chol/HDL Ratio: 2.8 ratio (ref 0.0–4.4)
Cholesterol, Total: 156 mg/dL (ref 100–199)
HDL: 56 mg/dL (ref >39)
LDL Chol Calc (NIH): 89 mg/dL (ref 0–99)
Triglycerides: 52 mg/dL (ref 0–149)
VLDL Cholesterol Cal: 11 mg/dL (ref 5–40)

## 2021-12-03 ENCOUNTER — Encounter: Payer: Self-pay | Admitting: Cardiovascular Disease

## 2021-12-03 ENCOUNTER — Telehealth: Payer: Self-pay | Admitting: Pharmacist

## 2021-12-03 NOTE — Telephone Encounter (Signed)
LDL-C much better. Patient intolerant to Repatha, Praluent, rosuvastatin 10mg  daily, rosuvastatin 5mg  MWF, atorvastatin.  Continue pravastatin 20mg  daily and zetia 10mg  daily. Called pt. No answer and VM full. Will try again later.

## 2021-12-03 NOTE — Progress Notes (Unsigned)
Cardiology Office Note:    Date:  12/04/2021   ID:  Cheryl Hinton, DOB Jan 12, 1936, MRN [id number]  PCP:  Daisy Floro, MD  Cardiologist: Wilbert Hayashi  Electrophysiologist:  None   Referring MD: Daisy Floro, MD   Chief Complaint  Patient presents with   Hypertension         Previous notes:    Jan. 14, 2021 Cheryl Hinton is a 86 y.o. female with a hx of hypertension and hyperlipidemia.   I saw her years ago for HTN.  She has been on telmesartan  She was recently in the hospital with a headache and was found to have hypertension.  CT scan of the brain was negative . Potassium was low.  She was given amlodipine and extra potassium supplement for that day only  and was told to follow-up with [place].    We are asked to see her today by Dr. Catha Gosselin for further evaluation and management of her hypertension.  Has been eating more salt.   Organic popcorn recently .  Does not eat meat for the most part. Grilled chicken on occasion .   I saw her back in August, 2013 for shortness of breath.  I put her on Lisinopril years ago and she did very well.  Echocardiogram at that time was essentially normal.  Ejection fraction was 55 to 60%.  Myoview study was negative for ischemia and showed normal left ventricular systolic function.  August 19, 2019: Cheryl Hinton is seen today for a follow-up visit regarding her hypertension and leg edema. Her last echocardiogram in 2013 reveals normal left ventricular systolic function.  She did have left ventricular hypertrophy.  Has greatly reduced her salt intake and her BP and leg edema have resolved.  Was eating lots of organic popcorn and using lots of table salt   Sees "stars" when she turns her head to the side Carotid duplex in 2019 showed mild plaque.   She has had hyperlipidemia .   Has tried multiple statins .   Has tried niacin,lipitor,   Total cholesterol is 248.  The HDL is 49.  The LDL is 181.  She is willing to try  rosuvastatin. We discussed the fact that she has tried multiple statins in the past and did not tolerate them.  She also has tried niacin and could not tolerate that.  With her elevated LDL I would have a very low threshold to refer her to the lipid clinic for consideration for PCSK9 inhibitor if she does not tolerate the rosuvastatin.  August 11, 2020: Cheryl Hinton is seen today for follow-up of her hypertension and leg edema.  She has normal left ventricular systolic function.  She does have LVH. Needs to have Right knee replacement  She is at low risk for her upcoming knee replacement .  No CP , no dyspnea   Is only taking Rosuvastatin 10 mg a week - higher doses make her muscle ache  Is on zetia 10 mg a day    November 15, 2020: Cheryl Hinton is seen today for follow up her HTN, leg edema  Seems to be doing well Has some / rare palpitations  Causes her to take a deep breath  Has had for years  Swims 2 days a week Has exercise class twice a week.   We discussed having her wear an event monitor .  She wore a Holter Monitor 20+ years ago  She does not think its severe enough to warrant this at  this point    December 04, 2021 Cheryl Hinton is seen today for follow up of her HTN, leg edema  The food at [employer] is very salty.  She  occasionally   She fell in 2013 at a restaurant that was not up to code. She injurred her shoulder and filed an insurance  Now [employer] owner is harrasing her       Past Medical History:  Diagnosis Date   Anemia    Prior to hysterectomy   Anxiety    Arthritis    Barrett's esophagus    Colon polyps 1985   Diverticulosis    Endometriosis    Esophageal stricture    GERD (gastroesophageal reflux disease)    Hx of migraine headaches    Hyperlipidemia    Hypertension    IBS (irritable bowel syndrome)    Pneumonia    Status post dilation of esophageal narrowing    Urticaria     Past Surgical History:  Procedure Laterality Date   ADENOIDECTOMY      APPENDECTOMY  1956   BREAST BIOPSY Left    CATARACT EXTRACTION W/ INTRAOCULAR LENS IMPLANT     COLONOSCOPY     ESOPHAGEAL MANOMETRY N/A 01/13/2017   Procedure: ESOPHAGEAL MANOMETRY (EM);  Surgeon: Mauri Pole, MD;  Location: [facility];  Service: Endoscopy;  Laterality: N/A;   ESOPHAGOGASTRODUODENOSCOPY ENDOSCOPY     FOOT SURGERY Bilateral    tumor removed   HEMORROIDECTOMY     KNEE SURGERY Bilateral [phone]   x 3   PARTIAL HYSTERECTOMY     TONSILLECTOMY     TONSILLECTOMY     TOTAL ABDOMINAL HYSTERECTOMY     for endometriosis   TOTAL KNEE ARTHROPLASTY Right 09/11/2020   Procedure: TOTAL KNEE ARTHROPLASTY;  Surgeon: Gaynelle Arabian, MD;  Location: [facility];  Service: Orthopedics;  Laterality: Right;  2min    Current Medications: Current Meds  Medication Sig   ALPRAZolam (XANAX) 0.5 MG tablet Take 0.25-0.5 mg by mouth at bedtime as needed for sleep.   aspirin 81 MG chewable tablet Chew by mouth daily.   CALCIUM PO Take 2 tablets by mouth daily.   Cholecalciferol (VITAMIN D) 50 MCG (2000 UT) tablet Take 2,000 Units by mouth daily.   cyclobenzaprine (FLEXERIL) 5 MG tablet 1-2 Tablet(s) By Mouth Every 12 Hours PRN   ezetimibe (ZETIA) 10 MG tablet Take 1 tablet (10 mg total) by mouth daily.   hydrALAZINE (APRESOLINE) 25 MG tablet Take 25 mg by mouth 2 (two) times daily.   hydrOXYzine (ATARAX/VISTARIL) 50 MG tablet Take 50 mg by mouth at bedtime.   Multiple Vitamins-Minerals (OCUVITE ADULT 50+) CAPS Take 1 capsule by mouth daily.   nebivolol (BYSTOLIC) 5 MG tablet TAKE 1 TABLET EVERY DAY   Omega-3 Fatty Acids (FISH OIL) 1200 MG CAPS Take 1 capsule by mouth daily.   oxyCODONE-acetaminophen (PERCOCET) 10-325 MG tablet Take 1 tablet by mouth four times a day as needed for pain   pantoprazole (PROTONIX) 40 MG tablet Take 40 mg by mouth daily.   polyvinyl alcohol (LIQUIFILM TEARS) 1.4 % ophthalmic solution Place 1 drop into both eyes as needed for dry eyes.   potassium chloride  (KLOR-CON) 10 MEQ tablet Take 1 tablet by mouth twice daily   pravastatin (PRAVACHOL) 40 MG tablet Take 1 tablet (40 mg total) by mouth every evening.   pregabalin (LYRICA) 50 MG capsule Take 50 mg by mouth daily.   Red Yeast Rice 600 MG CAPS Take 1,200 mg by  mouth daily.   sertraline (ZOLOFT) 50 MG tablet Take 50 mg by mouth daily.   sucralfate (CARAFATE) 1 GM/10ML suspension Take 10 mLs by mouth 3 (three) times daily as needed (acid reflux).   telmisartan-hydrochlorothiazide (MICARDIS HCT) 80-25 MG tablet TAKE 1 TABLET BY MOUTH ONCE DAILY   valACYclovir (VALTREX) 500 MG tablet Take 500 mg by mouth daily.   vitamin C (ASCORBIC ACID) 500 MG tablet Take 500 mg by mouth daily.     Allergies:   Duloxetine hcl, Influenza vaccines, Lisinopril, Nsaids, Other, Praluent [alirocumab], Prednisone, Repatha [evolocumab], Rosuvastatin, and Statins   Social History   Socioeconomic History   Marital status: Widowed    Spouse name: Not on file   Number of children: 1   Years of education: Not on file   Highest education level: Not on file  Occupational History   Occupation: RETIRED    Employer: RETIRED  Tobacco Use   Smoking status: Former    Packs/day: 0.50    Years: 10.00    Total pack years: 5.00    Types: Cigarettes    Quit date: 05/27/1966    Years since quitting: 55.5   Smokeless tobacco: Never  Vaping Use   Vaping Use: Never used  Substance and Sexual Activity   Alcohol use: No   Drug use: No   Sexual activity: Not Currently  Other Topics Concern   Not on file  Social History Narrative   Not on file   Social Determinants of Health   Financial Resource Strain: Not on file  Food Insecurity: Not on file  Transportation Needs: Not on file  Physical Activity: Not on file  Stress: Not on file  Social Connections: Not on file     Family History: The patient's family history includes Allergic rhinitis in her brother, mother, and sister; Colon cancer in her mother; Diabetes in her  mother; Heart disease in her father; Heart failure in her sister.  ROS:   Please see the history of present illness.     All other systems reviewed and are negative.  EKGs/Labs/Other Studies Reviewed:    The following studies were reviewed today:  EKG:   Recent Labs: 06/08/2021: ALT 14  Recent Lipid Panel    Component Value Date/Time   CHOL 156 11/30/2021 1015   TRIG 52 11/30/2021 1015   HDL 56 11/30/2021 1015   CHOLHDL 2.8 11/30/2021 1015   LDLCALC 89 11/30/2021 1015    Physical Exam:     Physical Exam: Blood pressure 124/66, pulse 65, height 5' 3.5" (1.613 m), weight 166 lb 3.2 oz (75.4 kg), SpO2 92 %.  GEN:  Well nourished, well developed in no acute distress HEENT: Normal NECK: No JVD; No carotid bruits LYMPHATICS: No lymphadenopathy CARDIAC: RRR , no murmurs, rubs, gallops RESPIRATORY:  Clear to auscultation without rales, wheezing or rhonchi  ABDOMEN: Soft, non-tender, non-distended MUSCULOSKELETAL:  No edema; No deformity  SKIN: Warm and dry NEUROLOGIC:  Alert and oriented x 3  ECG: December 04, 2021: Normal sinus rhythm at 65.  No ST or T wave changes.   ASSESSMENT:    1. Primary hypertension     PLAN:       Essential HTN:    Blood pressure today looks okay.  On occasion her blood pressure goes as high as 200 at night.  I suspect this is because she is eating some salty food.  [facility] and so she does not really have control of her how much  salt in her food. She has hydralazine to take if her blood pressure goes too high.  At this point I do not think that we should change her baseline medications but continued to take hydralazine at night.  2.  Hyperlipidemia:       3.   Leg edema :       Medication Adjustments/Labs and Tests Ordered: Current medicines are reviewed at length with the patient today.  Concerns regarding medicines are outlined above.  Orders Placed This Encounter  Procedures   EKG 12-Lead    No orders of the  defined types were placed in this encounter.    Patient Instructions  Medication Instructions:  Your physician recommends that you continue on your current medications as directed. Please refer to the Current Medication list given to you today.  *If you need a refill on your cardiac medications before your next appointment, please call your pharmacy*   Lab Work: NONE If you have labs (blood work) drawn today and your tests are completely normal, you will receive your results only by: Allison (if you have MyChart) OR A paper copy in the mail If you have any lab test that is abnormal or we need to change your treatment, we will call you to review the results.   Testing/Procedures: NONE   Follow-Up: At [facility], you and your health needs are our priority.  As part of our continuing mission to provide you with exceptional heart care, we have created designated Provider Care Teams.  These Care Teams include your primary Cardiologist (physician) and Advanced Practice Providers (APPs -  Physician Assistants and Nurse Practitioners) who all work together to provide you with the care you need, when you need it.  We recommend signing up for the patient portal called "MyChart".  Sign up information is provided on this After Visit Summary.  MyChart is used to connect with patients for Virtual Visits (Telemedicine).  Patients are able to view lab/test results, encounter notes, upcoming appointments, etc.  Non-urgent messages can be sent to your provider as well.   To learn more about what you can do with MyChart, go to [email or web address].    Your next appointment:   1 year(s)  The format for your next appointment:   In Person  Provider:   Ronn Melena, or Nataline Basara {     Important Information About Sugar         Signed, Mertie Moores, MD  12/04/2021 5:38 PM    [place]

## 2021-12-04 ENCOUNTER — Ambulatory Visit: Payer: Medicare PPO | Admitting: Cardiovascular Disease

## 2021-12-04 ENCOUNTER — Encounter: Payer: Self-pay | Admitting: Cardiovascular Disease

## 2021-12-04 VITALS — BP 124/66 | HR 65 | Ht 63.5 in | Wt 166.2 lb

## 2021-12-04 DIAGNOSIS — I10 Essential (primary) hypertension: Secondary | ICD-10-CM | POA: Diagnosis not present

## 2021-12-04 NOTE — Patient Instructions (Signed)
Medication Instructions:  Your physician recommends that you continue on your current medications as directed. Please refer to the Current Medication list given to you today.  *If you need a refill on your cardiac medications before your next appointment, please call your pharmacy*   Lab Work: NONE If you have labs (blood work) drawn today and your tests are completely normal, you will receive your results only by: MyChart Message (if you have MyChart) OR A paper copy in the mail If you have any lab test that is abnormal or we need to change your treatment, we will call you to review the results.   Testing/Procedures: NONE   Follow-Up: At [facility], you and your health needs are our priority.  As part of our continuing mission to provide you with exceptional heart care, we have created designated Provider Care Teams.  These Care Teams include your primary Cardiologist (physician) and Advanced Practice Providers (APPs -  Physician Assistants and Nurse Practitioners) who all work together to provide you with the care you need, when you need it.  We recommend signing up for the patient portal called "MyChart".  Sign up information is provided on this After Visit Summary.  MyChart is used to connect with patients for Virtual Visits (Telemedicine).  Patients are able to view lab/test results, encounter notes, upcoming appointments, etc.  Non-urgent messages can be sent to your provider as well.   To learn more about what you can do with MyChart, go to [email or web address].    Your next appointment:   1 year(s)  The format for your next appointment:   In Person  Provider:   Swinyer, Weaver, or Nahser {     Important Information About Sugar       

## 2021-12-04 NOTE — Telephone Encounter (Signed)
Sarah RN reviewed labs with patient at her office visit with Dr. Elease Hashimoto 7/11

## 2021-12-06 ENCOUNTER — Other Ambulatory Visit (HOSPITAL_COMMUNITY): Payer: Self-pay

## 2021-12-06 DIAGNOSIS — Z79891 Long term (current) use of opiate analgesic: Secondary | ICD-10-CM | POA: Diagnosis not present

## 2021-12-06 DIAGNOSIS — G894 Chronic pain syndrome: Secondary | ICD-10-CM | POA: Diagnosis not present

## 2021-12-06 DIAGNOSIS — M4726 Other spondylosis with radiculopathy, lumbar region: Secondary | ICD-10-CM | POA: Diagnosis not present

## 2021-12-06 DIAGNOSIS — M47812 Spondylosis without myelopathy or radiculopathy, cervical region: Secondary | ICD-10-CM | POA: Diagnosis not present

## 2021-12-06 MED ORDER — OXYCODONE-ACETAMINOPHEN 10-325 MG PO TABS
ORAL_TABLET | ORAL | 0 refills | Status: DC
  Filled 2021-12-20: qty 120, 30d supply, fill #0

## 2021-12-20 ENCOUNTER — Other Ambulatory Visit (HOSPITAL_COMMUNITY): Payer: Self-pay

## 2021-12-23 ENCOUNTER — Other Ambulatory Visit: Payer: Self-pay | Admitting: Cardiovascular Disease

## 2022-01-18 ENCOUNTER — Other Ambulatory Visit (HOSPITAL_COMMUNITY): Payer: Self-pay

## 2022-01-18 MED ORDER — OXYCODONE-ACETAMINOPHEN 10-325 MG PO TABS
ORAL_TABLET | ORAL | 0 refills | Status: AC
  Filled 2022-01-21: qty 120, 30d supply, fill #0

## 2022-01-21 ENCOUNTER — Other Ambulatory Visit (HOSPITAL_COMMUNITY): Payer: Self-pay

## 2022-02-04 ENCOUNTER — Encounter: Payer: Self-pay | Admitting: Physician Assistant

## 2022-02-04 ENCOUNTER — Ambulatory Visit: Payer: Medicare PPO | Attending: Physician Assistant | Admitting: Physician Assistant

## 2022-02-04 VITALS — BP 120/70 | HR 68 | Ht 62.0 in | Wt 162.6 lb

## 2022-02-04 DIAGNOSIS — R0989 Other specified symptoms and signs involving the circulatory and respiratory systems: Secondary | ICD-10-CM | POA: Diagnosis not present

## 2022-02-04 DIAGNOSIS — R0601 Orthopnea: Secondary | ICD-10-CM | POA: Diagnosis not present

## 2022-02-04 DIAGNOSIS — I10 Essential (primary) hypertension: Secondary | ICD-10-CM | POA: Diagnosis not present

## 2022-02-04 DIAGNOSIS — R600 Localized edema: Secondary | ICD-10-CM | POA: Diagnosis not present

## 2022-02-04 MED ORDER — FUROSEMIDE 20 MG PO TABS: 20.0000 mg | ORAL_TABLET | Freq: Every day | ORAL | 1 refills | Status: AC | PRN

## 2022-02-04 NOTE — Patient Instructions (Signed)
Medication Instructions:  START Lasix 20mg  take 1 tablet daily for 3 days then take as needed *If you need a refill on your cardiac medications before your next appointment, please call your pharmacy*   Lab Work: TODAY-BMET If you have labs (blood work) drawn today and your tests are completely normal, you will receive your results only by: MyChart Message (if you have MyChart) OR A paper copy in the mail If you have any lab test that is abnormal or we need to change your treatment, we will call you to review the results.   Testing/Procedures: Your physician has requested that you have an echocardiogram. Echocardiography is a painless test that uses sound waves to create images of your heart. It provides your doctor with information about the size and shape of your heart and how well your heart's chambers and valves are working. This procedure takes approximately one hour. There are no restrictions for this procedure.   Follow-Up: At [facility], you and your health needs are our priority.  As part of our continuing mission to provide you with exceptional heart care, we have created designated Provider Care Teams.  These Care Teams include your primary Cardiologist (physician) and Advanced Practice Providers (APPs -  Physician Assistants and Nurse Practitioners) who all work together to provide you with the care you need, when you need it.  We recommend signing up for the patient portal called "MyChart".  Sign up information is provided on this After Visit Summary.  MyChart is used to connect with patients for Virtual Visits (Telemedicine).  Patients are able to view lab/test results, encounter notes, upcoming appointments, etc.  Non-urgent messages can be sent to your provider as well.   To learn more about what you can do with MyChart, go to [facility].    Your next appointment:   4 week(s)  The format for your next appointment:   In Person  Provider:   [email or web address], PA-C        Other Instructions   Important Information About Sugar

## 2022-02-04 NOTE — Progress Notes (Signed)
Cardiology Office Note:    Date:  02/04/2022   ID:  Abbott Pao, DOB [birth year], MRN [id number]  PCP:  Daisy Floro, MD  [facility] Cardiologist:  Kristeen Miss, MD  [facility] Electrophysiologist:  None   Chief Complaint: swelling   History of Present Illness:    Cheryl Hinton is a 86 y.o. female with a hx of HTN and HLD seen for leg edema.  Echo in 2013 with LVEF of 55-60%.   Followed with Dr. Elease Hashimoto for hypertension. Noted high salt diet leading to elevated BP and LE edema during OV 11/2021.   Patient is here for follow-up. Assisted living facility continues to give high salt diet.  Has intermittent lower extremity edema.  NO orthopnea and PND.  She has tried to avoid salt as much as he can.  She denies palpitation, dizziness or syncope.  She keeps pillow under her leg at night.  Past Medical History:  Diagnosis Date   Anemia    Prior to hysterectomy   Anxiety    Arthritis    Barrett's esophagus    Colon polyps 1985   Diverticulosis    Endometriosis    Esophageal stricture    GERD (gastroesophageal reflux disease)    Hx of migraine headaches    Hyperlipidemia    Hypertension    IBS (irritable bowel syndrome)    Pneumonia    Status post dilation of esophageal narrowing    Urticaria     Past Surgical History:  Procedure Laterality Date   ADENOIDECTOMY     APPENDECTOMY  1956   BREAST BIOPSY Left    CATARACT EXTRACTION W/ INTRAOCULAR LENS IMPLANT     COLONOSCOPY     ESOPHAGEAL MANOMETRY N/A 01/13/2017   Procedure: ESOPHAGEAL MANOMETRY (EM);  Surgeon: Napoleon Form, MD;  Location: [facility];  Service: Endoscopy;  Laterality: N/A;   ESOPHAGOGASTRODUODENOSCOPY ENDOSCOPY     FOOT SURGERY Bilateral    tumor removed   HEMORROIDECTOMY     KNEE SURGERY Bilateral [phone]   x 3   PARTIAL HYSTERECTOMY     TONSILLECTOMY     TONSILLECTOMY     TOTAL ABDOMINAL HYSTERECTOMY     for endometriosis   TOTAL KNEE ARTHROPLASTY Right  09/11/2020   Procedure: TOTAL KNEE ARTHROPLASTY;  Surgeon: Ollen Gross, MD;  Location: [facility];  Service: Orthopedics;  Laterality: Right;     Current Medications: Current Meds  Medication Sig   ALPRAZolam (XANAX) 0.5 MG tablet Take 0.25-0.5 mg by mouth at bedtime as needed for sleep.   aspirin 81 MG chewable tablet Chew by mouth daily.   CALCIUM PO Take 2 tablets by mouth daily.   Cholecalciferol (VITAMIN D) 50 MCG (2000 UT) tablet Take 2,000 Units by mouth daily.   cyclobenzaprine (FLEXERIL) 5 MG tablet 1-2 Tablet(s) By Mouth Every 12 Hours PRN   ezetimibe (ZETIA) 10 MG tablet Take 1 tablet (10 mg total) by mouth daily.   furosemide (LASIX) 20 MG tablet Take 1 tablet (20 mg total) by mouth daily as needed. TAKE AS DIRECTED   hydrALAZINE (APRESOLINE) 25 MG tablet Take 25 mg by mouth 2 (two) times daily.   hydrOXYzine (ATARAX/VISTARIL) 50 MG tablet Take 50 mg by mouth at bedtime.   Multiple Vitamins-Minerals (OCUVITE ADULT 50+) CAPS Take 1 capsule by mouth daily.   nebivolol (BYSTOLIC) 5 MG tablet TAKE 1 TABLET EVERY DAY   Omega-3 Fatty Acids (FISH OIL) 1200 MG CAPS Take 1 capsule by mouth daily.  oxyCODONE-acetaminophen (PERCOCET) 10-325 MG tablet Take 1 tablet by mouth four times a day as needed for pain   oxyCODONE-acetaminophen (PERCOCET) 10-325 MG tablet Take 1 tablet by mouth 4 times a day as needed for pain   pantoprazole (PROTONIX) 40 MG tablet Take 40 mg by mouth daily.   polyvinyl alcohol (LIQUIFILM TEARS) 1.4 % ophthalmic solution Place 1 drop into both eyes as needed for dry eyes.   potassium chloride (KLOR-CON) 10 MEQ tablet Take 1 tablet (10 mEq total) by mouth 2 (two) times daily.   pravastatin (PRAVACHOL) 40 MG tablet Take 1 tablet (40 mg total) by mouth every evening.   pregabalin (LYRICA) 50 MG capsule Take 50 mg by mouth daily.   Red Yeast Rice 600 MG CAPS Take 1,200 mg by mouth daily.   sertraline (ZOLOFT) 50 MG tablet Take 50 mg by mouth daily.   sucralfate  (CARAFATE) 1 GM/10ML suspension Take 10 mLs by mouth 3 (three) times daily as needed (acid reflux).   telmisartan-hydrochlorothiazide (MICARDIS HCT) 80-25 MG tablet TAKE 1 TABLET BY MOUTH ONCE DAILY   valACYclovir (VALTREX) 500 MG tablet Take 500 mg by mouth daily.   vitamin C (ASCORBIC ACID) 500 MG tablet Take 500 mg by mouth daily.     Allergies:   Duloxetine hcl, Influenza vaccines, Lisinopril, Nsaids, Other, Praluent [alirocumab], Prednisone, Repatha [evolocumab], Rosuvastatin, and Statins   Social History   Socioeconomic History   Marital status: Widowed    Spouse name: Not on file   Number of children: 1   Years of education: Not on file   Highest education level: Not on file  Occupational History   Occupation: RETIRED    Employer: RETIRED  Tobacco Use   Smoking status: Former    Packs/day: 0.50    Years: 10.00    Total pack years: 5.00    Types: Cigarettes    Quit date: 05/27/1966    Years since quitting: 55.7   Smokeless tobacco: Never  Vaping Use   Vaping Use: Never used  Substance and Sexual Activity   Alcohol use: No   Drug use: No   Sexual activity: Not Currently  Other Topics Concern   Not on file  Social History Narrative   Not on file   Social Determinants of Health   Financial Resource Strain: Not on file  Food Insecurity: Not on file  Transportation Needs: Not on file  Physical Activity: Not on file  Stress: Not on file  Social Connections: Not on file     Family History: The patient's family history includes Allergic rhinitis in her brother, mother, and sister; Colon cancer in her mother; Diabetes in her mother; Heart disease in her father; Heart failure in her sister.    ROS:   Please see the history of present illness.    All other systems reviewed and are negative.   EKGs/Labs/Other Studies Reviewed:    The following studies were reviewed today:  Echo 12/2011 Study Conclusions   Left ventricle: The cavity size was normal. Wall thickness   was increased in a pattern of mild LVH. Systolic function  was normal. The estimated ejection fraction was in the range  of 55% to 60%. Wall motion was normal; there were no  regional wall motion abnormalities.   EKG:  EKG is not ordered today.    Recent Labs: 06/08/2021: ALT 14  Recent Lipid Panel    Component Value Date/Time   CHOL 156 11/30/2021 1015   TRIG 52 11/30/2021 1015  HDL 56 11/30/2021 1015   CHOLHDL 2.8 11/30/2021 1015   LDLCALC 89 11/30/2021 1015    Physical Exam:    VS:  BP 120/70   Pulse 68   Ht 5\' 2"  (1.575 m)   Wt 162 lb 9.6 oz (73.8 kg)   SpO2 96%   BMI 29.74 kg/m     Wt Readings from Last 3 Encounters:  02/04/22 162 lb 9.6 oz (73.8 kg)  12/04/21 166 lb 3.2 oz (75.4 kg)  11/15/20 155 lb 3.2 oz (70.4 kg)     GEN:  Well nourished, well developed in no acute distress HEENT: Normal NECK: No JVD; No carotid bruits LYMPHATICS: No lymphadenopathy CARDIAC: RRR, no murmurs, rubs, gallops RESPIRATORY:  Clear to auscultation without rales, wheezing or rhonchi  ABDOMEN: Soft, non-tender, non-distended MUSCULOSKELETAL: 1+ edema; No deformity  SKIN: Warm and dry NEUROLOGIC:  Alert and oriented x 3 PSYCHIATRIC:  Normal affect   ASSESSMENT AND PLAN:    Bilateral lower extremity edema as well as orthopnea and PND Due to excess salt intake.  She will try to avoid high salt food at facility but sometimes cannot.  She is keeping her leg elevated.  She does not have anyone to help with compression stocking.  We will try Lasix.  She prefers to have echocardiogram.  2.  Hypertension -Blood pressure stable.  Continue current dose of telmisartan/HCTZ, hydralazine and bystolic.   Medication Adjustments/Labs and Tests Ordered: Current medicines are reviewed at length with the patient today.  Concerns regarding medicines are outlined above.  Orders Placed This Encounter  Procedures   Basic Metabolic Panel (BMET)   ECHOCARDIOGRAM COMPLETE   Meds ordered this  encounter  Medications   furosemide (LASIX) 20 MG tablet    Sig: Take 1 tablet (20 mg total) by mouth daily as needed. TAKE AS DIRECTED    Dispense:  30 tablet    Refill:  1    Patient Instructions  Medication Instructions:  START Lasix 20mg  take 1 tablet daily for 3 days then take as needed *If you need a refill on your cardiac medications before your next appointment, please call your pharmacy*   Lab Work: TODAY-BMET If you have labs (blood work) drawn today and your tests are completely normal, you will receive your results only by: MyChart Message (if you have MyChart) OR A paper copy in the mail If you have any lab test that is abnormal or we need to change your treatment, we will call you to review the results.   Testing/Procedures: Your physician has requested that you have an echocardiogram. Echocardiography is a painless test that uses sound waves to create images of your heart. It provides your doctor with information about the size and shape of your heart and how well your heart's chambers and valves are working. This procedure takes approximately one hour. There are no restrictions for this procedure.   Follow-Up: At [facility], you and your health needs are our priority.  As part of our continuing mission to provide you with exceptional heart care, we have created designated Provider Care Teams.  These Care Teams include your primary Cardiologist (physician) and Advanced Practice Providers (APPs -  Physician Assistants and Nurse Practitioners) who all work together to provide you with the care you need, when you need it.  We recommend signing up for the patient portal called "MyChart".  Sign up information is provided on this After Visit Summary.  MyChart is used to connect with patients for Virtual  Visits (Telemedicine).  Patients are able to view lab/test results, encounter notes, upcoming appointments, etc.  Non-urgent messages can be sent to your provider as  well.   To learn more about what you can do with MyChart, go to [email or web address].    Your next appointment:   4 week(s)  The format for your next appointment:   In Person  Provider:   Chelsea Aus, PA-C        Other Instructions   Important Information About Sugar         Lorelei Pont, PA  02/04/2022 11:44 AM    [facility]

## 2022-02-05 LAB — BASIC METABOLIC PANEL
BUN/Creatinine Ratio: 19 (ref 12–28)
BUN: 13 mg/dL (ref 8–27)
CO2: 29 mmol/L (ref 20–29)
Calcium: 9.5 mg/dL (ref 8.7–10.3)
Chloride: 100 mmol/L (ref 96–106)
Creatinine, Ser: 0.7 mg/dL (ref 0.57–1.00)
Glucose: 90 mg/dL (ref 70–99)
Potassium: 4.1 mmol/L (ref 3.5–5.2)
Sodium: 143 mmol/L (ref 134–144)
eGFR: 85 mL/min/{1.73_m2} (ref >59)

## 2022-02-06 DIAGNOSIS — H35033 Hypertensive retinopathy, bilateral: Secondary | ICD-10-CM | POA: Diagnosis not present

## 2022-02-06 DIAGNOSIS — H2511 Age-related nuclear cataract, right eye: Secondary | ICD-10-CM | POA: Diagnosis not present

## 2022-02-06 DIAGNOSIS — H04123 Dry eye syndrome of bilateral lacrimal glands: Secondary | ICD-10-CM | POA: Diagnosis not present

## 2022-02-06 DIAGNOSIS — B0052 Herpesviral keratitis: Secondary | ICD-10-CM | POA: Diagnosis not present

## 2022-02-06 DIAGNOSIS — D3131 Benign neoplasm of right choroid: Secondary | ICD-10-CM | POA: Diagnosis not present

## 2022-02-06 DIAGNOSIS — H26492 Other secondary cataract, left eye: Secondary | ICD-10-CM | POA: Diagnosis not present

## 2022-02-06 DIAGNOSIS — Z961 Presence of intraocular lens: Secondary | ICD-10-CM | POA: Diagnosis not present

## 2022-02-06 DIAGNOSIS — H43811 Vitreous degeneration, right eye: Secondary | ICD-10-CM | POA: Diagnosis not present

## 2022-02-09 ENCOUNTER — Other Ambulatory Visit: Payer: Self-pay | Admitting: Cardiovascular Disease

## 2022-02-18 ENCOUNTER — Ambulatory Visit (HOSPITAL_COMMUNITY): Payer: Medicare PPO | Attending: Physician Assistant

## 2022-02-18 DIAGNOSIS — I10 Essential (primary) hypertension: Secondary | ICD-10-CM

## 2022-02-18 DIAGNOSIS — R600 Localized edema: Secondary | ICD-10-CM | POA: Diagnosis not present

## 2022-02-18 DIAGNOSIS — R0989 Other specified symptoms and signs involving the circulatory and respiratory systems: Secondary | ICD-10-CM | POA: Insufficient documentation

## 2022-02-18 DIAGNOSIS — R0601 Orthopnea: Secondary | ICD-10-CM | POA: Insufficient documentation

## 2022-02-18 LAB — ECHOCARDIOGRAM COMPLETE
Area-P 1/2: 3.32 cm2
S' Lateral: 2.6 cm

## 2022-02-19 ENCOUNTER — Other Ambulatory Visit (HOSPITAL_COMMUNITY): Payer: Self-pay

## 2022-02-20 ENCOUNTER — Other Ambulatory Visit (HOSPITAL_COMMUNITY): Payer: Self-pay

## 2022-02-20 DIAGNOSIS — Z79891 Long term (current) use of opiate analgesic: Secondary | ICD-10-CM | POA: Diagnosis not present

## 2022-02-20 DIAGNOSIS — M4726 Other spondylosis with radiculopathy, lumbar region: Secondary | ICD-10-CM | POA: Diagnosis not present

## 2022-02-20 DIAGNOSIS — G894 Chronic pain syndrome: Secondary | ICD-10-CM | POA: Diagnosis not present

## 2022-02-20 DIAGNOSIS — M47812 Spondylosis without myelopathy or radiculopathy, cervical region: Secondary | ICD-10-CM | POA: Diagnosis not present

## 2022-02-20 MED ORDER — OXYCODONE-ACETAMINOPHEN 7.5-325 MG PO TABS
1.0000 | ORAL_TABLET | ORAL | 0 refills | Status: DC | PRN
  Filled 2022-02-20: qty 180, 30d supply, fill #0

## 2022-02-22 NOTE — Progress Notes (Signed)
Pt has been made aware of normal result and verbalized understanding.  jw

## 2022-03-03 DIAGNOSIS — H2511 Age-related nuclear cataract, right eye: Secondary | ICD-10-CM | POA: Diagnosis not present

## 2022-03-05 ENCOUNTER — Telehealth: Payer: Self-pay

## 2022-03-05 NOTE — Patient Outreach (Signed)
  Care Coordination   03/05/2022 Name: Cheryl Hinton MRN: [id number] DOB: Jan 09, 1936   Care Coordination Outreach Attempts:  An unsuccessful telephone outreach was attempted today to offer the patient information about available care coordination services as a benefit of their health plan.   Follow Up Plan:  Additional outreach attempts will be made to offer the patient care coordination information and services.   Encounter Outcome:  No Answer  Care Coordination Interventions Activated:  No   Care Coordination Interventions:  No, not indicated      Enzo Montgomery, RN,BSN,CCM [facility] Telephonic Care Management Coordinator Direct Phone: [phone] Toll Free: [phone] Fax: [phone]

## 2022-03-07 ENCOUNTER — Telehealth: Payer: Self-pay

## 2022-03-07 DIAGNOSIS — H2511 Age-related nuclear cataract, right eye: Secondary | ICD-10-CM | POA: Diagnosis not present

## 2022-03-07 NOTE — Patient Outreach (Signed)
  Care Coordination   Initial Visit Note   03/07/2022 Name: Cheryl Hinton MRN: [id number] DOB: 1936/01/10  Cheryl Hinton is a 86 y.o. year old female who sees Lawerance Cruel, MD for primary care. I spoke with  [name] by phone today.  What matters to the patients health and wellness today?  Spoke with patient who voices she lives in senior independent living. She has supportive staff that assists her as needed. If she becomes unable to reside there she is able to move to the assisted living portion. Patient independent with ADLs at present and still drives. She plans to talk with PCP about getting meds via mail order to save her some trips back and forth to pharmacy. Patient appreciative to [facility] but she denies any RN CM needs or concerns at this time.     Goals Addressed             This Visit's Progress    COMPLETED: Care Coordination-no follow up required       Care Coordination Interventions: Advised patient to provide appropriate vaccination information to provider or CM team member at next visit Provided education to patient re: [place] social determinant of health barriers Updated immunization record-pt received flu vaccine last week at local pharmacy Annual Wellness Visit completed on 6/5/2          SDOH assessments and interventions completed:  Yes     Care Coordination Interventions Activated:  Yes  Care Coordination Interventions:  Yes, provided   Follow up plan: No further intervention required.   Encounter Outcome:  Pt. Visit Completed   Enzo Montgomery, RN,BSN,CCM [facility] Telephonic Care Management Coordinator Direct Phone: [phone] Toll Free: [phone] Fax: [phone]

## 2022-03-12 ENCOUNTER — Ambulatory Visit: Payer: Medicare PPO | Admitting: Physician Assistant

## 2022-03-12 NOTE — Progress Notes (Deleted)
Cardiology Office Note:    Date:  03/12/2022   ID:  Cheryl Hinton, DOB 1936/03/24, MRN [id number]  PCP:  Lawerance Cruel, MD  [facility] Cardiologist:  Mertie Moores, MD  [facility] Electrophysiologist:  None   Chief Complaint: follow up   History of Present Illness:    Cheryl Hinton is a 86 y.o. female with a hx of HTN, HLD and eg edema seen for follow up.   Echo in 2013 with LVEF of 55-60%.    Followed with Dr. Acie Fredrickson for hypertension. Noted high salt diet leading to elevated BP and LE edema during OV 11/2021.   Last seen by me September 2023.  She was getting high salt diet at facility.  Had intermittent edema with orthopnea and PND.  Felt due to excess salt intake. Given as needed Lasix.  Echocardiogram showed preserved LV function with grade 1 diastolic dysfunction.   Past Medical History:  Diagnosis Date   Anemia    Prior to hysterectomy   Anxiety    Arthritis    Barrett's esophagus    Colon polyps 1985   Diverticulosis    Endometriosis    Esophageal stricture    GERD (gastroesophageal reflux disease)    Hx of migraine headaches    Hyperlipidemia    Hypertension    IBS (irritable bowel syndrome)    Pneumonia    Status post dilation of esophageal narrowing    Urticaria     Past Surgical History:  Procedure Laterality Date   ADENOIDECTOMY     APPENDECTOMY  1956   BREAST BIOPSY Left    CATARACT EXTRACTION W/ INTRAOCULAR LENS IMPLANT     COLONOSCOPY     ESOPHAGEAL MANOMETRY N/A 01/13/2017   Procedure: ESOPHAGEAL MANOMETRY (EM);  Surgeon: Mauri Pole, MD;  Location: [facility];  Service: Endoscopy;  Laterality: N/A;   ESOPHAGOGASTRODUODENOSCOPY ENDOSCOPY     FOOT SURGERY Bilateral    tumor removed   HEMORROIDECTOMY     KNEE SURGERY Bilateral [phone]   x 3   PARTIAL HYSTERECTOMY     TONSILLECTOMY     TONSILLECTOMY     TOTAL ABDOMINAL HYSTERECTOMY     for endometriosis   TOTAL KNEE ARTHROPLASTY Right 09/11/2020    Procedure: TOTAL KNEE ARTHROPLASTY;  Surgeon: Gaynelle Arabian, MD;  Location: [facility];  Service: Orthopedics;  Laterality: Right;  50min    Current Medications: No outpatient medications have been marked as taking for the 03/12/22 encounter (Appointment) with Leanor Kail, [place].     Allergies:   Duloxetine hcl, Influenza vaccines, Lisinopril, Nsaids, Other, Praluent [alirocumab], Prednisone, Repatha [evolocumab], Rosuvastatin, and Statins   Social History   Socioeconomic History   Marital status: Widowed    Spouse name: Not on file   Number of children: 1   Years of education: Not on file   Highest education level: Not on file  Occupational History   Occupation: RETIRED    Employer: RETIRED  Tobacco Use   Smoking status: Former    Packs/day: 0.50    Years: 10.00    Total pack years: 5.00    Types: Cigarettes    Quit date: 05/27/1966    Years since quitting: 55.8   Smokeless tobacco: Never  Vaping Use   Vaping Use: Never used  Substance and Sexual Activity   Alcohol use: No   Drug use: No   Sexual activity: Not Currently  Other Topics Concern   Not on file  Social History Narrative  Not on file   Social Determinants of Health   Financial Resource Strain: Not on file  Food Insecurity: Not on file  Transportation Needs: Not on file  Physical Activity: Not on file  Stress: Not on file  Social Connections: Not on file     Family History: The patient's family history includes Allergic rhinitis in her brother, mother, and sister; Colon cancer in her mother; Diabetes in her mother; Heart disease in her father; Heart failure in her sister.    ROS:   Please see the history of present illness.    All other systems reviewed and are negative.   EKGs/Labs/Other Studies Reviewed:    The following studies were reviewed today:  Echo 02/18/22 1. Left ventricular ejection fraction, by estimation, is 60 to 65%. The  left ventricle has normal function. The left ventricle  has no regional  wall motion abnormalities. There is moderate concentric left ventricular  hypertrophy. Left ventricular  diastolic parameters are consistent with Grade I diastolic dysfunction  (impaired relaxation).   2. Right ventricular systolic function is normal. The right ventricular  size is normal. There is normal pulmonary artery systolic pressure.   3. The mitral valve is normal in structure. Trivial mitral valve  regurgitation. No evidence of mitral stenosis.   4. The aortic valve is tricuspid. Aortic valve regurgitation is not  visualized. No aortic stenosis is present.   5. The inferior vena cava is normal in size with greater than 50%  respiratory variability, suggesting right atrial pressure of 3 mmHg.   Comparison(s): No prior Echocardiogram.   EKG:  EKG is *** ordered today.  The ekg ordered today demonstrates ***  Recent Labs: 06/08/2021: ALT 14 02/04/2022: BUN 13; Creatinine, Ser 0.70; Potassium 4.1; Sodium 143  Recent Lipid Panel    Component Value Date/Time   CHOL 156 11/30/2021 1015   TRIG 52 11/30/2021 1015   HDL 56 11/30/2021 1015   CHOLHDL 2.8 11/30/2021 1015   LDLCALC 89 11/30/2021 1015     Risk Assessment/Calculations:   {Does this patient have ATRIAL FIBRILLATION?:[phone]}   Physical Exam:    VS:  There were no vitals taken for this visit.    Wt Readings from Last 3 Encounters:  02/04/22 162 lb 9.6 oz (73.8 kg)  12/04/21 166 lb 3.2 oz (75.4 kg)  11/15/20 155 lb 3.2 oz (70.4 kg)     GEN: *** Well nourished, well developed in no acute distress HEENT: Normal NECK: No JVD; No carotid bruits LYMPHATICS: No lymphadenopathy CARDIAC: ***RRR, no murmurs, rubs, gallops RESPIRATORY:  Clear to auscultation without rales, wheezing or rhonchi  ABDOMEN: Soft, non-tender, non-distended MUSCULOSKELETAL:  No edema; No deformity  SKIN: Warm and dry NEUROLOGIC:  Alert and oriented x 3 PSYCHIATRIC:  Normal affect   ASSESSMENT AND PLAN:    ***  2.  ***  Medication Adjustments/Labs and Tests Ordered: Current medicines are reviewed at length with the patient today.  Concerns regarding medicines are outlined above.  No orders of the defined types were placed in this encounter.  No orders of the defined types were placed in this encounter.   There are no Patient Instructions on file for this visit.   Lorelei Pont, PA  03/12/2022 1:01 PM    [facility]

## 2022-03-13 ENCOUNTER — Other Ambulatory Visit: Payer: Self-pay | Admitting: Cardiovascular Disease

## 2022-03-15 ENCOUNTER — Telehealth: Payer: Self-pay | Admitting: *Deleted

## 2022-03-15 MED ORDER — POTASSIUM CHLORIDE ER 10 MEQ PO TBCR: 10.0000 meq | EXTENDED_RELEASE_TABLET | Freq: Two times a day (BID) | ORAL | 3 refills | Status: DC

## 2022-03-15 NOTE — Telephone Encounter (Signed)
REFILL 

## 2022-03-25 ENCOUNTER — Other Ambulatory Visit (HOSPITAL_COMMUNITY): Payer: Self-pay

## 2022-03-25 MED ORDER — OXYCODONE-ACETAMINOPHEN 7.5-325 MG PO TABS
1.0000 | ORAL_TABLET | ORAL | 0 refills | Status: AC | PRN
  Filled 2022-03-25: qty 180, 30d supply, fill #0

## 2022-03-28 DIAGNOSIS — K222 Esophageal obstruction: Secondary | ICD-10-CM | POA: Diagnosis not present

## 2022-03-28 DIAGNOSIS — K219 Gastro-esophageal reflux disease without esophagitis: Secondary | ICD-10-CM | POA: Diagnosis not present

## 2022-03-28 DIAGNOSIS — F411 Generalized anxiety disorder: Secondary | ICD-10-CM | POA: Diagnosis not present

## 2022-03-28 DIAGNOSIS — F329 Major depressive disorder, single episode, unspecified: Secondary | ICD-10-CM | POA: Diagnosis not present

## 2022-03-28 DIAGNOSIS — I10 Essential (primary) hypertension: Secondary | ICD-10-CM | POA: Diagnosis not present

## 2022-03-28 DIAGNOSIS — Z0000 Encounter for general adult medical examination without abnormal findings: Secondary | ICD-10-CM | POA: Diagnosis not present

## 2022-03-28 DIAGNOSIS — D485 Neoplasm of uncertain behavior of skin: Secondary | ICD-10-CM | POA: Diagnosis not present

## 2022-03-28 DIAGNOSIS — Z6830 Body mass index (BMI) 30.0-30.9, adult: Secondary | ICD-10-CM | POA: Diagnosis not present

## 2022-04-01 ENCOUNTER — Other Ambulatory Visit (HOSPITAL_COMMUNITY): Payer: Self-pay

## 2022-04-01 MED ORDER — CYCLOBENZAPRINE HCL 5 MG PO TABS
5.0000 mg | ORAL_TABLET | Freq: Two times a day (BID) | ORAL | 0 refills | Status: AC | PRN
  Filled 2022-04-01: qty 120, 30d supply, fill #0

## 2022-04-15 ENCOUNTER — Other Ambulatory Visit (HOSPITAL_COMMUNITY): Payer: Self-pay

## 2022-04-15 DIAGNOSIS — Z79891 Long term (current) use of opiate analgesic: Secondary | ICD-10-CM | POA: Diagnosis not present

## 2022-04-15 DIAGNOSIS — M47812 Spondylosis without myelopathy or radiculopathy, cervical region: Secondary | ICD-10-CM | POA: Diagnosis not present

## 2022-04-15 DIAGNOSIS — G894 Chronic pain syndrome: Secondary | ICD-10-CM | POA: Diagnosis not present

## 2022-04-15 DIAGNOSIS — M4726 Other spondylosis with radiculopathy, lumbar region: Secondary | ICD-10-CM | POA: Diagnosis not present

## 2022-04-15 MED ORDER — OXYCODONE-ACETAMINOPHEN 7.5-325 MG PO TABS
1.0000 | ORAL_TABLET | ORAL | 0 refills | Status: DC | PRN
  Filled 2022-04-25: qty 180, 30d supply, fill #0

## 2022-04-25 ENCOUNTER — Other Ambulatory Visit (HOSPITAL_COMMUNITY): Payer: Self-pay

## 2022-04-26 ENCOUNTER — Other Ambulatory Visit (HOSPITAL_BASED_OUTPATIENT_CLINIC_OR_DEPARTMENT_OTHER): Payer: Self-pay

## 2022-04-26 MED ORDER — OXYCODONE-ACETAMINOPHEN 10-325 MG PO TABS
1.0000 | ORAL_TABLET | Freq: Four times a day (QID) | ORAL | 0 refills | Status: AC | PRN
  Filled 2022-04-26: qty 120, 30d supply, fill #0

## 2022-04-29 ENCOUNTER — Other Ambulatory Visit (HOSPITAL_COMMUNITY): Payer: Self-pay

## 2022-04-30 DIAGNOSIS — C44311 Basal cell carcinoma of skin of nose: Secondary | ICD-10-CM | POA: Diagnosis not present

## 2022-04-30 DIAGNOSIS — R238 Other skin changes: Secondary | ICD-10-CM | POA: Diagnosis not present

## 2022-04-30 DIAGNOSIS — D485 Neoplasm of uncertain behavior of skin: Secondary | ICD-10-CM | POA: Diagnosis not present

## 2022-05-09 ENCOUNTER — Other Ambulatory Visit (HOSPITAL_BASED_OUTPATIENT_CLINIC_OR_DEPARTMENT_OTHER): Payer: Self-pay

## 2022-05-13 DIAGNOSIS — M545 Low back pain, unspecified: Secondary | ICD-10-CM | POA: Diagnosis not present

## 2022-05-13 DIAGNOSIS — M7062 Trochanteric bursitis, left hip: Secondary | ICD-10-CM | POA: Diagnosis not present

## 2022-05-13 DIAGNOSIS — M25552 Pain in left hip: Secondary | ICD-10-CM | POA: Diagnosis not present

## 2022-05-30 ENCOUNTER — Other Ambulatory Visit (HOSPITAL_COMMUNITY): Payer: Self-pay

## 2022-06-27 DIAGNOSIS — Z03818 Encounter for observation for suspected exposure to other biological agents ruled out: Secondary | ICD-10-CM | POA: Diagnosis not present

## 2022-06-27 DIAGNOSIS — J029 Acute pharyngitis, unspecified: Secondary | ICD-10-CM | POA: Diagnosis not present

## 2022-06-27 DIAGNOSIS — J208 Acute bronchitis due to other specified organisms: Secondary | ICD-10-CM | POA: Diagnosis not present

## 2022-06-27 DIAGNOSIS — R051 Acute cough: Secondary | ICD-10-CM | POA: Diagnosis not present

## 2022-06-27 DIAGNOSIS — R0981 Nasal congestion: Secondary | ICD-10-CM | POA: Diagnosis not present

## 2022-07-03 DIAGNOSIS — R059 Cough, unspecified: Secondary | ICD-10-CM | POA: Diagnosis not present

## 2022-07-03 DIAGNOSIS — Z8709 Personal history of other diseases of the respiratory system: Secondary | ICD-10-CM | POA: Diagnosis not present

## 2022-07-03 DIAGNOSIS — J209 Acute bronchitis, unspecified: Secondary | ICD-10-CM | POA: Diagnosis not present

## 2022-07-15 ENCOUNTER — Other Ambulatory Visit: Payer: Self-pay | Admitting: Cardiovascular Disease

## 2022-07-25 DIAGNOSIS — M4726 Other spondylosis with radiculopathy, lumbar region: Secondary | ICD-10-CM | POA: Diagnosis not present

## 2022-07-25 DIAGNOSIS — M47812 Spondylosis without myelopathy or radiculopathy, cervical region: Secondary | ICD-10-CM | POA: Diagnosis not present

## 2022-07-25 DIAGNOSIS — G894 Chronic pain syndrome: Secondary | ICD-10-CM | POA: Diagnosis not present

## 2022-07-25 DIAGNOSIS — Z79891 Long term (current) use of opiate analgesic: Secondary | ICD-10-CM | POA: Diagnosis not present

## 2022-07-31 DIAGNOSIS — C44311 Basal cell carcinoma of skin of nose: Secondary | ICD-10-CM | POA: Diagnosis not present

## 2022-08-26 DIAGNOSIS — H0102B Squamous blepharitis left eye, upper and lower eyelids: Secondary | ICD-10-CM | POA: Diagnosis not present

## 2022-08-26 DIAGNOSIS — B0052 Herpesviral keratitis: Secondary | ICD-10-CM | POA: Diagnosis not present

## 2022-08-26 DIAGNOSIS — D3131 Benign neoplasm of right choroid: Secondary | ICD-10-CM | POA: Diagnosis not present

## 2022-08-26 DIAGNOSIS — H35033 Hypertensive retinopathy, bilateral: Secondary | ICD-10-CM | POA: Diagnosis not present

## 2022-08-26 DIAGNOSIS — H04123 Dry eye syndrome of bilateral lacrimal glands: Secondary | ICD-10-CM | POA: Diagnosis not present

## 2022-08-26 DIAGNOSIS — H0102A Squamous blepharitis right eye, upper and lower eyelids: Secondary | ICD-10-CM | POA: Diagnosis not present

## 2022-08-26 DIAGNOSIS — Z961 Presence of intraocular lens: Secondary | ICD-10-CM | POA: Diagnosis not present

## 2022-08-26 DIAGNOSIS — H16142 Punctate keratitis, left eye: Secondary | ICD-10-CM | POA: Diagnosis not present

## 2022-08-26 DIAGNOSIS — H43811 Vitreous degeneration, right eye: Secondary | ICD-10-CM | POA: Diagnosis not present

## 2022-08-28 ENCOUNTER — Other Ambulatory Visit (HOSPITAL_COMMUNITY): Payer: Self-pay

## 2022-08-28 MED ORDER — OXYCODONE-ACETAMINOPHEN 10-325 MG PO TABS
ORAL_TABLET | ORAL | 0 refills | Status: AC
  Filled 2022-08-28: qty 120, 30d supply, fill #0

## 2022-09-19 ENCOUNTER — Other Ambulatory Visit (HOSPITAL_COMMUNITY): Payer: Self-pay

## 2022-09-19 DIAGNOSIS — M47812 Spondylosis without myelopathy or radiculopathy, cervical region: Secondary | ICD-10-CM | POA: Diagnosis not present

## 2022-09-19 DIAGNOSIS — G894 Chronic pain syndrome: Secondary | ICD-10-CM | POA: Diagnosis not present

## 2022-09-19 DIAGNOSIS — M4726 Other spondylosis with radiculopathy, lumbar region: Secondary | ICD-10-CM | POA: Diagnosis not present

## 2022-09-19 DIAGNOSIS — Z79891 Long term (current) use of opiate analgesic: Secondary | ICD-10-CM | POA: Diagnosis not present

## 2022-09-19 MED ORDER — CYCLOBENZAPRINE HCL 5 MG PO TABS
5.0000 mg | ORAL_TABLET | Freq: Two times a day (BID) | ORAL | 1 refills | Status: AC | PRN
  Filled 2022-09-19: qty 120, 30d supply, fill #0

## 2022-09-19 MED ORDER — OXYCODONE-ACETAMINOPHEN 10-325 MG PO TABS
1.0000 | ORAL_TABLET | Freq: Every day | ORAL | 0 refills | Status: AC | PRN
  Filled 2022-09-19: qty 150, 30d supply, fill #0

## 2022-09-20 DIAGNOSIS — M5416 Radiculopathy, lumbar region: Secondary | ICD-10-CM | POA: Diagnosis not present

## 2022-09-20 DIAGNOSIS — M4126 Other idiopathic scoliosis, lumbar region: Secondary | ICD-10-CM | POA: Diagnosis not present

## 2022-09-20 DIAGNOSIS — Z6829 Body mass index (BMI) 29.0-29.9, adult: Secondary | ICD-10-CM | POA: Diagnosis not present

## 2022-10-16 DIAGNOSIS — D485 Neoplasm of uncertain behavior of skin: Secondary | ICD-10-CM | POA: Diagnosis not present

## 2022-10-16 DIAGNOSIS — Z5189 Encounter for other specified aftercare: Secondary | ICD-10-CM | POA: Diagnosis not present

## 2022-10-16 DIAGNOSIS — L570 Actinic keratosis: Secondary | ICD-10-CM | POA: Diagnosis not present

## 2022-10-16 DIAGNOSIS — L905 Scar conditions and fibrosis of skin: Secondary | ICD-10-CM | POA: Diagnosis not present

## 2022-10-17 DIAGNOSIS — M5416 Radiculopathy, lumbar region: Secondary | ICD-10-CM | POA: Diagnosis not present

## 2022-10-17 DIAGNOSIS — M5126 Other intervertebral disc displacement, lumbar region: Secondary | ICD-10-CM | POA: Diagnosis not present

## 2022-10-22 ENCOUNTER — Other Ambulatory Visit (HOSPITAL_COMMUNITY): Payer: Self-pay

## 2022-10-22 MED ORDER — OXYCODONE-ACETAMINOPHEN 10-325 MG PO TABS
1.0000 | ORAL_TABLET | Freq: Every day | ORAL | 0 refills | Status: AC | PRN
  Filled 2022-10-22: qty 150, 30d supply, fill #0

## 2022-10-23 DIAGNOSIS — M5416 Radiculopathy, lumbar region: Secondary | ICD-10-CM | POA: Diagnosis not present

## 2022-10-23 DIAGNOSIS — Z6829 Body mass index (BMI) 29.0-29.9, adult: Secondary | ICD-10-CM | POA: Diagnosis not present

## 2022-10-24 DIAGNOSIS — I10 Essential (primary) hypertension: Secondary | ICD-10-CM | POA: Diagnosis not present

## 2022-10-24 DIAGNOSIS — L299 Pruritus, unspecified: Secondary | ICD-10-CM | POA: Diagnosis not present

## 2022-10-24 DIAGNOSIS — E7800 Pure hypercholesterolemia, unspecified: Secondary | ICD-10-CM | POA: Diagnosis not present

## 2022-10-24 DIAGNOSIS — F411 Generalized anxiety disorder: Secondary | ICD-10-CM | POA: Diagnosis not present

## 2022-10-24 DIAGNOSIS — Z9989 Dependence on other enabling machines and devices: Secondary | ICD-10-CM | POA: Diagnosis not present

## 2022-10-24 DIAGNOSIS — Z6830 Body mass index (BMI) 30.0-30.9, adult: Secondary | ICD-10-CM | POA: Diagnosis not present

## 2022-11-04 DIAGNOSIS — M5416 Radiculopathy, lumbar region: Secondary | ICD-10-CM | POA: Diagnosis not present

## 2022-11-04 DIAGNOSIS — M5116 Intervertebral disc disorders with radiculopathy, lumbar region: Secondary | ICD-10-CM | POA: Diagnosis not present

## 2022-11-11 ENCOUNTER — Other Ambulatory Visit (HOSPITAL_COMMUNITY): Payer: Self-pay

## 2022-11-11 MED ORDER — PREGABALIN 50 MG PO CAPS
50.0000 mg | ORAL_CAPSULE | Freq: Two times a day (BID) | ORAL | 2 refills | Status: AC
  Filled 2022-11-11: qty 60, 30d supply, fill #0

## 2022-11-18 ENCOUNTER — Other Ambulatory Visit (HOSPITAL_COMMUNITY): Payer: Self-pay

## 2022-11-18 DIAGNOSIS — M4726 Other spondylosis with radiculopathy, lumbar region: Secondary | ICD-10-CM | POA: Diagnosis not present

## 2022-11-18 DIAGNOSIS — Z79891 Long term (current) use of opiate analgesic: Secondary | ICD-10-CM | POA: Diagnosis not present

## 2022-11-18 DIAGNOSIS — M47812 Spondylosis without myelopathy or radiculopathy, cervical region: Secondary | ICD-10-CM | POA: Diagnosis not present

## 2022-11-18 DIAGNOSIS — G894 Chronic pain syndrome: Secondary | ICD-10-CM | POA: Diagnosis not present

## 2022-11-19 ENCOUNTER — Other Ambulatory Visit (HOSPITAL_COMMUNITY): Payer: Self-pay

## 2022-11-21 ENCOUNTER — Other Ambulatory Visit (HOSPITAL_COMMUNITY): Payer: Self-pay

## 2022-11-22 ENCOUNTER — Other Ambulatory Visit (HOSPITAL_COMMUNITY): Payer: Self-pay

## 2022-11-23 ENCOUNTER — Other Ambulatory Visit (HOSPITAL_COMMUNITY): Payer: Self-pay

## 2022-11-25 DIAGNOSIS — L570 Actinic keratosis: Secondary | ICD-10-CM | POA: Diagnosis not present

## 2022-11-26 ENCOUNTER — Other Ambulatory Visit (HOSPITAL_COMMUNITY): Payer: Self-pay

## 2022-12-11 ENCOUNTER — Other Ambulatory Visit: Payer: Self-pay

## 2022-12-11 MED ORDER — POTASSIUM CHLORIDE ER 10 MEQ PO TBCR: 10.0000 meq | EXTENDED_RELEASE_TABLET | Freq: Two times a day (BID) | ORAL | 0 refills | Status: DC

## 2023-01-13 DIAGNOSIS — M4726 Other spondylosis with radiculopathy, lumbar region: Secondary | ICD-10-CM | POA: Diagnosis not present

## 2023-01-13 DIAGNOSIS — Z79891 Long term (current) use of opiate analgesic: Secondary | ICD-10-CM | POA: Diagnosis not present

## 2023-01-13 DIAGNOSIS — M47812 Spondylosis without myelopathy or radiculopathy, cervical region: Secondary | ICD-10-CM | POA: Diagnosis not present

## 2023-01-13 DIAGNOSIS — G894 Chronic pain syndrome: Secondary | ICD-10-CM | POA: Diagnosis not present

## 2023-02-06 ENCOUNTER — Other Ambulatory Visit: Payer: Self-pay | Admitting: Cardiovascular Disease

## 2023-02-13 DIAGNOSIS — M5116 Intervertebral disc disorders with radiculopathy, lumbar region: Secondary | ICD-10-CM | POA: Diagnosis not present

## 2023-02-13 DIAGNOSIS — M5416 Radiculopathy, lumbar region: Secondary | ICD-10-CM | POA: Diagnosis not present

## 2023-03-12 DIAGNOSIS — M47812 Spondylosis without myelopathy or radiculopathy, cervical region: Secondary | ICD-10-CM | POA: Diagnosis not present

## 2023-03-12 DIAGNOSIS — Z79891 Long term (current) use of opiate analgesic: Secondary | ICD-10-CM | POA: Diagnosis not present

## 2023-03-12 DIAGNOSIS — M4726 Other spondylosis with radiculopathy, lumbar region: Secondary | ICD-10-CM | POA: Diagnosis not present

## 2023-03-12 DIAGNOSIS — G894 Chronic pain syndrome: Secondary | ICD-10-CM | POA: Diagnosis not present

## 2023-03-20 ENCOUNTER — Other Ambulatory Visit: Payer: Self-pay

## 2023-03-24 DIAGNOSIS — I10 Essential (primary) hypertension: Secondary | ICD-10-CM | POA: Diagnosis not present

## 2023-06-03 DIAGNOSIS — D3131 Benign neoplasm of right choroid: Secondary | ICD-10-CM | POA: Diagnosis not present

## 2023-06-03 DIAGNOSIS — H04123 Dry eye syndrome of bilateral lacrimal glands: Secondary | ICD-10-CM | POA: Diagnosis not present

## 2023-06-03 DIAGNOSIS — H10023 Other mucopurulent conjunctivitis, bilateral: Secondary | ICD-10-CM | POA: Diagnosis not present

## 2023-06-03 DIAGNOSIS — H43811 Vitreous degeneration, right eye: Secondary | ICD-10-CM | POA: Diagnosis not present

## 2023-06-03 DIAGNOSIS — B0052 Herpesviral keratitis: Secondary | ICD-10-CM | POA: Diagnosis not present

## 2023-06-03 DIAGNOSIS — H35033 Hypertensive retinopathy, bilateral: Secondary | ICD-10-CM | POA: Diagnosis not present

## 2023-06-03 DIAGNOSIS — H1132 Conjunctival hemorrhage, left eye: Secondary | ICD-10-CM | POA: Diagnosis not present

## 2023-06-03 DIAGNOSIS — H16142 Punctate keratitis, left eye: Secondary | ICD-10-CM | POA: Diagnosis not present

## 2023-06-03 DIAGNOSIS — H0102A Squamous blepharitis right eye, upper and lower eyelids: Secondary | ICD-10-CM | POA: Diagnosis not present

## 2023-06-10 DIAGNOSIS — L309 Dermatitis, unspecified: Secondary | ICD-10-CM | POA: Diagnosis not present

## 2023-06-10 DIAGNOSIS — J019 Acute sinusitis, unspecified: Secondary | ICD-10-CM | POA: Diagnosis not present

## 2023-06-12 DIAGNOSIS — G894 Chronic pain syndrome: Secondary | ICD-10-CM | POA: Diagnosis not present

## 2023-06-12 DIAGNOSIS — Z79891 Long term (current) use of opiate analgesic: Secondary | ICD-10-CM | POA: Diagnosis not present

## 2023-06-12 DIAGNOSIS — M4726 Other spondylosis with radiculopathy, lumbar region: Secondary | ICD-10-CM | POA: Diagnosis not present

## 2023-06-12 DIAGNOSIS — M47812 Spondylosis without myelopathy or radiculopathy, cervical region: Secondary | ICD-10-CM | POA: Diagnosis not present

## 2023-06-17 DIAGNOSIS — D3131 Benign neoplasm of right choroid: Secondary | ICD-10-CM | POA: Diagnosis not present

## 2023-06-17 DIAGNOSIS — B0052 Herpesviral keratitis: Secondary | ICD-10-CM | POA: Diagnosis not present

## 2023-06-17 DIAGNOSIS — H04123 Dry eye syndrome of bilateral lacrimal glands: Secondary | ICD-10-CM | POA: Diagnosis not present

## 2023-06-17 DIAGNOSIS — H10023 Other mucopurulent conjunctivitis, bilateral: Secondary | ICD-10-CM | POA: Diagnosis not present

## 2023-06-17 DIAGNOSIS — H43811 Vitreous degeneration, right eye: Secondary | ICD-10-CM | POA: Diagnosis not present

## 2023-06-17 DIAGNOSIS — H35033 Hypertensive retinopathy, bilateral: Secondary | ICD-10-CM | POA: Diagnosis not present

## 2023-06-17 DIAGNOSIS — Z961 Presence of intraocular lens: Secondary | ICD-10-CM | POA: Diagnosis not present

## 2023-06-17 DIAGNOSIS — H16142 Punctate keratitis, left eye: Secondary | ICD-10-CM | POA: Diagnosis not present

## 2023-07-14 DIAGNOSIS — R059 Cough, unspecified: Secondary | ICD-10-CM | POA: Diagnosis not present

## 2023-07-14 DIAGNOSIS — J189 Pneumonia, unspecified organism: Secondary | ICD-10-CM | POA: Diagnosis not present

## 2023-07-21 DIAGNOSIS — J189 Pneumonia, unspecified organism: Secondary | ICD-10-CM | POA: Diagnosis not present

## 2023-07-28 DIAGNOSIS — R051 Acute cough: Secondary | ICD-10-CM | POA: Diagnosis not present

## 2023-07-28 DIAGNOSIS — J189 Pneumonia, unspecified organism: Secondary | ICD-10-CM | POA: Diagnosis not present

## 2023-07-28 DIAGNOSIS — J209 Acute bronchitis, unspecified: Secondary | ICD-10-CM | POA: Diagnosis not present

## 2023-08-07 DIAGNOSIS — Z79891 Long term (current) use of opiate analgesic: Secondary | ICD-10-CM | POA: Diagnosis not present

## 2023-08-07 DIAGNOSIS — M47812 Spondylosis without myelopathy or radiculopathy, cervical region: Secondary | ICD-10-CM | POA: Diagnosis not present

## 2023-08-07 DIAGNOSIS — M4726 Other spondylosis with radiculopathy, lumbar region: Secondary | ICD-10-CM | POA: Diagnosis not present

## 2023-08-07 DIAGNOSIS — G894 Chronic pain syndrome: Secondary | ICD-10-CM | POA: Diagnosis not present

## 2023-08-09 DIAGNOSIS — J411 Mucopurulent chronic bronchitis: Secondary | ICD-10-CM | POA: Diagnosis not present

## 2023-08-12 ENCOUNTER — Other Ambulatory Visit (HOSPITAL_COMMUNITY): Payer: Self-pay

## 2023-08-21 IMAGING — DX DG CHEST 2V
2 series · 2 of 2 positions shown · non-contrast
Comparison: 07/01/2020

CLINICAL DATA: Persistent cough, shortness of breath

EXAM:
CHEST - 2 VIEW

[dg chest 2 view (1 of 2)]
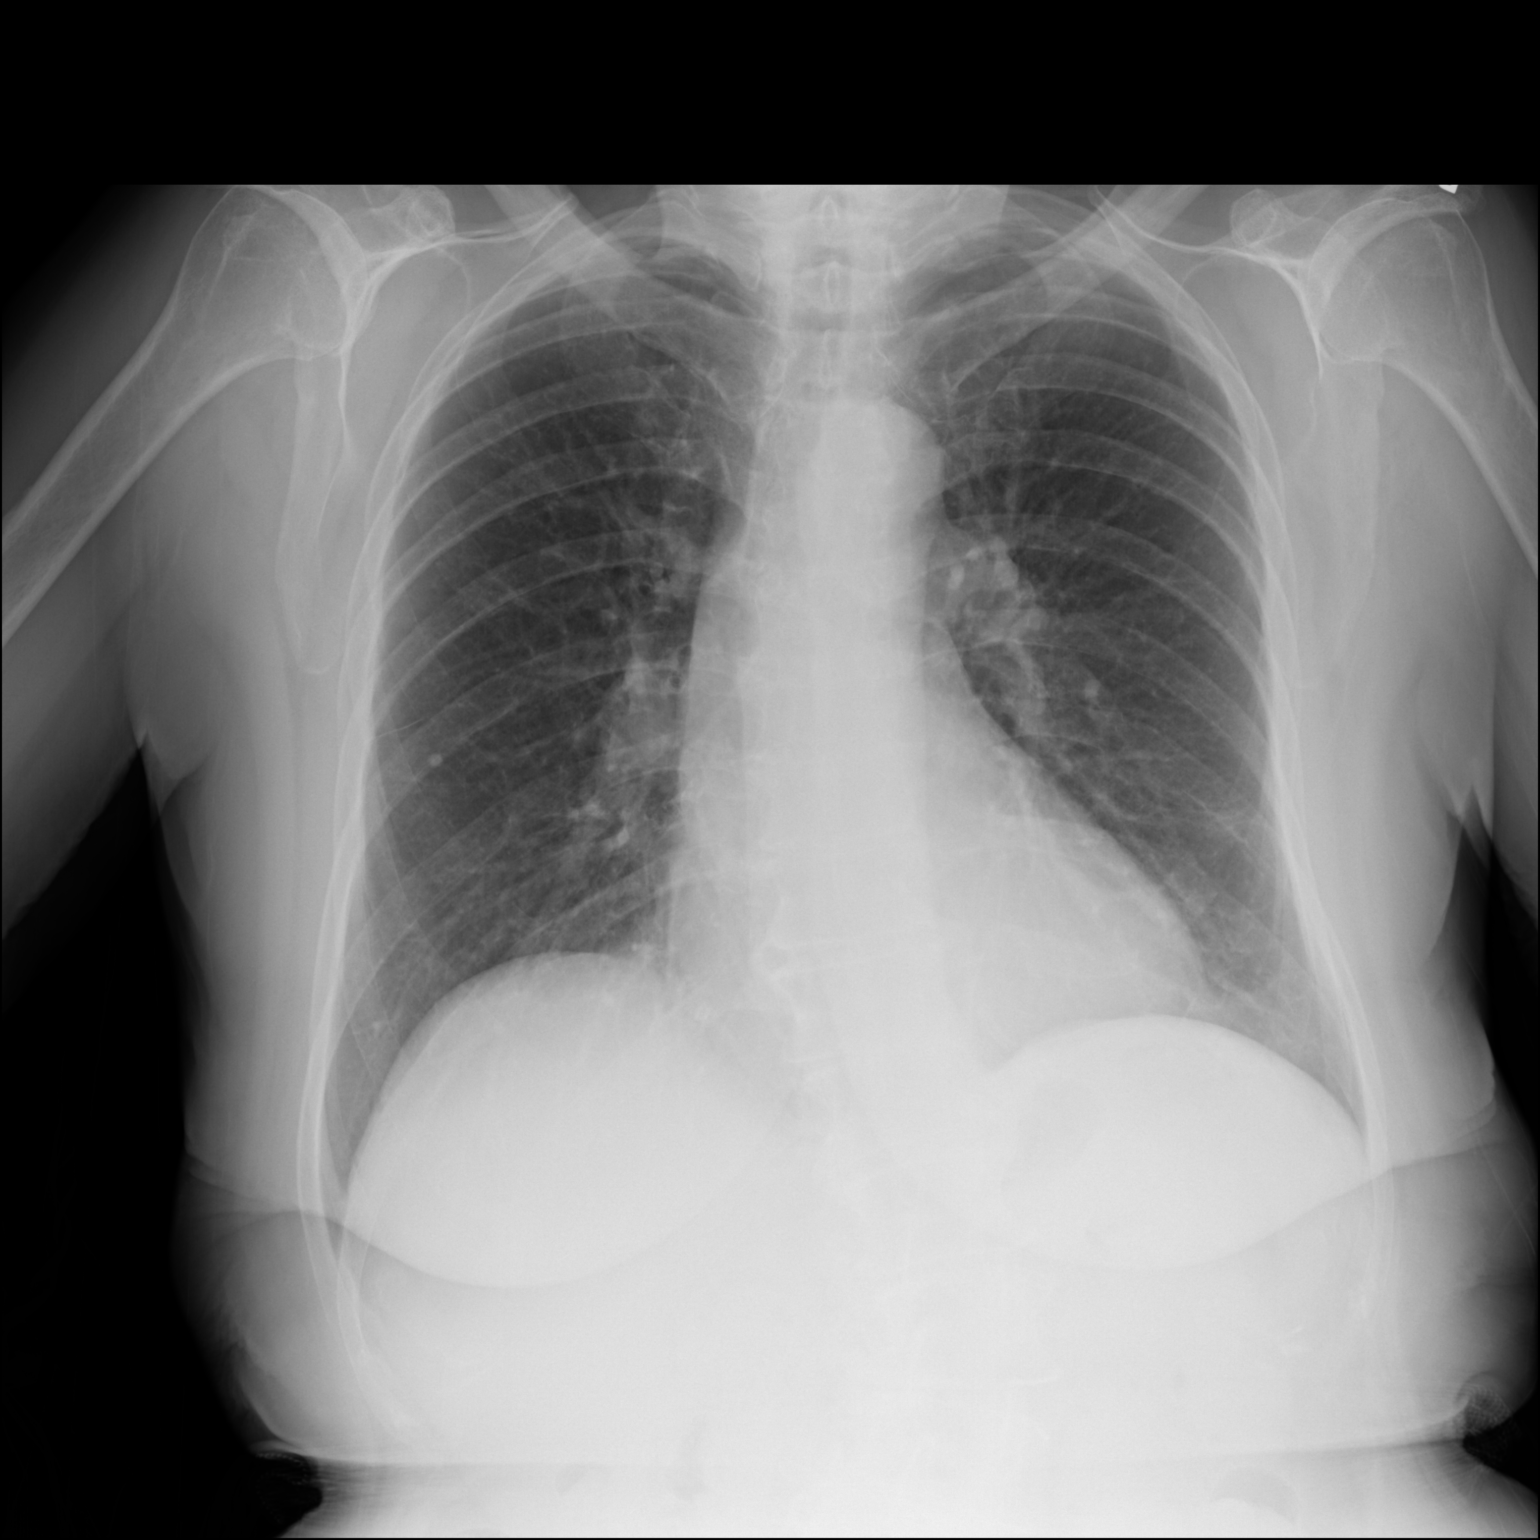

[dg chest 2 view (2 of 2)]
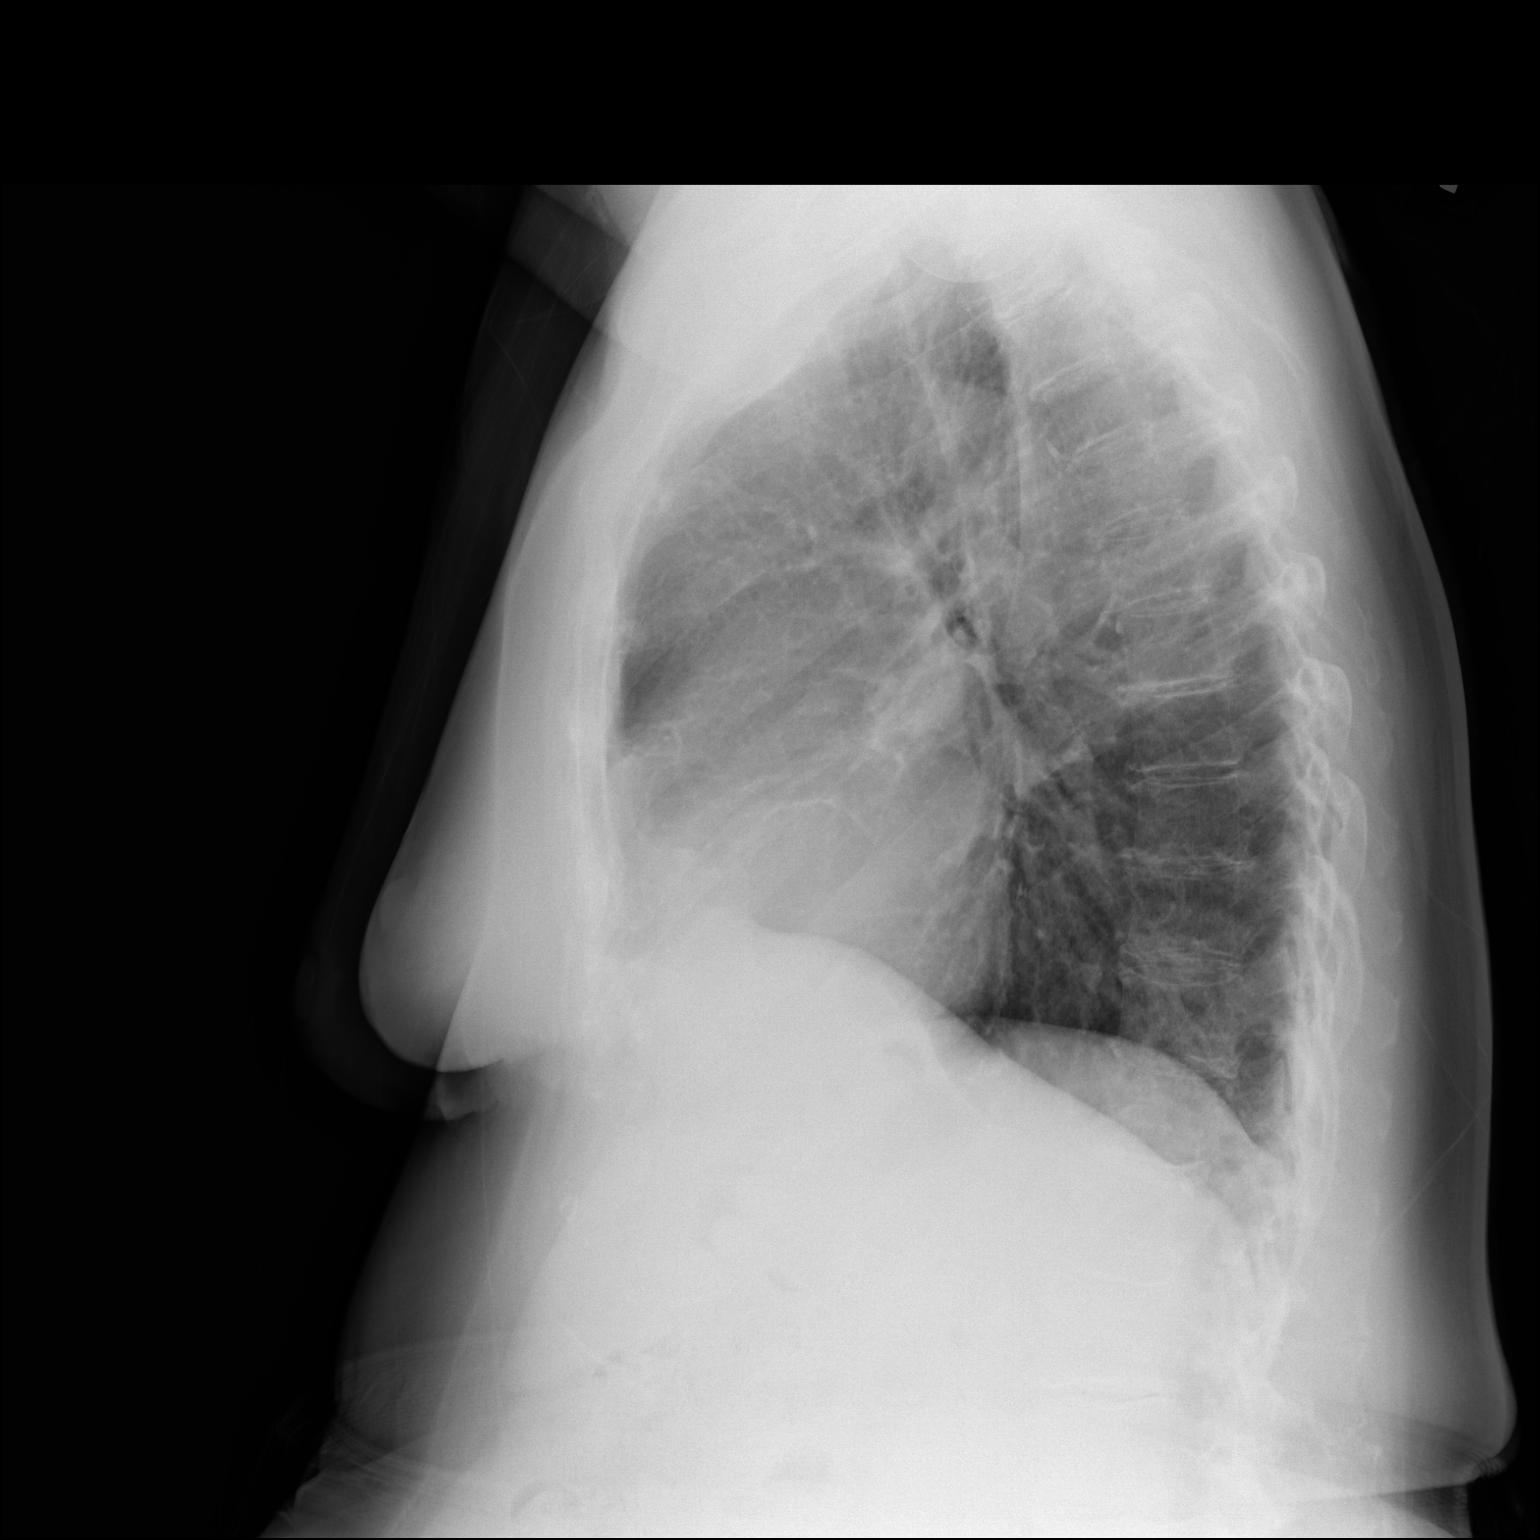

[2 of 2 positions shown; findings below may reference images not displayed]

FINDINGS: Cardiac and mediastinal contours are within normal limits. No focal
pulmonary opacity. No pleural effusion or pneumothorax. No acute
osseous abnormality.
IMPRESSION: No acute cardiopulmonary process.
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
## 2023-09-18 ENCOUNTER — Ambulatory Visit: Attending: Cardiovascular Disease | Admitting: Cardiovascular Disease

## 2023-09-18 VITALS — BP 150/65 | HR 67 | Ht 62.0 in | Wt 162.2 lb

## 2023-09-18 DIAGNOSIS — I10 Essential (primary) hypertension: Secondary | ICD-10-CM

## 2023-09-18 NOTE — Patient Instructions (Signed)
 Medication Instructions:   *If you need a refill on your cardiac medications before your next appointment, please call your pharmacy*  Lab Work:  If you have labs (blood work) drawn today and your tests are completely normal, you will receive your results only by: MyChart Message (if you have MyChart) OR A paper copy in the mail If you have any lab test that is abnormal or we need to change your treatment, we will call you to review the results.  Testing/Procedures:   Follow-Up: At [facility], you and your health needs are our priority.  As part of our continuing mission to provide you with exceptional heart care, our providers are all part of one team.  This team includes your primary Cardiologist (physician) and Advanced Practice Providers or APPs (Physician Assistants and Nurse Practitioners) who all work together to provide you with the care you need, when you need it.  Your next appointment: one year  We recommend signing up for the patient portal called "MyChart".  Sign up information is provided on this After Visit Summary.  MyChart is used to connect with patients for Virtual Visits (Telemedicine).  Patients are able to view lab/test results, encounter notes, upcoming appointments, etc.  Non-urgent messages can be sent to your provider as well.   To learn more about what you can do with MyChart, go to [email or web address].   Other Instructions       1st Floor: - Lobby - Registration  - Pharmacy  - Lab - Cafe  2nd Floor: - PV Lab - Diagnostic Testing (echo, CT, nuclear med)  3rd Floor: - Vacant  4th Floor: - TCTS (cardiothoracic surgery) - AFib Clinic - Structural Heart Clinic - Vascular Surgery  - Vascular Ultrasound  5th Floor: - HeartCare Cardiology (general and EP) - Clinical Pharmacy for coumadin, hypertension, lipid, weight-loss medications, and med management appointments    Valet parking services will be available as well.

## 2023-10-06 DIAGNOSIS — G894 Chronic pain syndrome: Secondary | ICD-10-CM | POA: Diagnosis not present

## 2023-10-06 DIAGNOSIS — M4726 Other spondylosis with radiculopathy, lumbar region: Secondary | ICD-10-CM | POA: Diagnosis not present

## 2023-10-06 DIAGNOSIS — M47812 Spondylosis without myelopathy or radiculopathy, cervical region: Secondary | ICD-10-CM | POA: Diagnosis not present

## 2023-10-06 DIAGNOSIS — Z79891 Long term (current) use of opiate analgesic: Secondary | ICD-10-CM | POA: Diagnosis not present

## 2023-10-28 DIAGNOSIS — L299 Pruritus, unspecified: Secondary | ICD-10-CM | POA: Diagnosis not present

## 2023-10-28 DIAGNOSIS — E7800 Pure hypercholesterolemia, unspecified: Secondary | ICD-10-CM | POA: Diagnosis not present

## 2023-10-28 DIAGNOSIS — F419 Anxiety disorder, unspecified: Secondary | ICD-10-CM | POA: Diagnosis not present

## 2023-10-28 DIAGNOSIS — I10 Essential (primary) hypertension: Secondary | ICD-10-CM | POA: Diagnosis not present

## 2023-10-28 DIAGNOSIS — Z6830 Body mass index (BMI) 30.0-30.9, adult: Secondary | ICD-10-CM | POA: Diagnosis not present

## 2023-10-28 DIAGNOSIS — H6120 Impacted cerumen, unspecified ear: Secondary | ICD-10-CM | POA: Diagnosis not present

## 2023-10-28 DIAGNOSIS — F329 Major depressive disorder, single episode, unspecified: Secondary | ICD-10-CM | POA: Diagnosis not present

## 2023-10-28 DIAGNOSIS — K219 Gastro-esophageal reflux disease without esophagitis: Secondary | ICD-10-CM | POA: Diagnosis not present

## 2023-10-28 DIAGNOSIS — R609 Edema, unspecified: Secondary | ICD-10-CM | POA: Diagnosis not present

## 2023-10-30 DIAGNOSIS — H6121 Impacted cerumen, right ear: Secondary | ICD-10-CM | POA: Diagnosis not present

## 2023-12-09 DIAGNOSIS — Z79891 Long term (current) use of opiate analgesic: Secondary | ICD-10-CM | POA: Diagnosis not present

## 2023-12-09 DIAGNOSIS — M4726 Other spondylosis with radiculopathy, lumbar region: Secondary | ICD-10-CM | POA: Diagnosis not present

## 2023-12-09 DIAGNOSIS — G894 Chronic pain syndrome: Secondary | ICD-10-CM | POA: Diagnosis not present

## 2023-12-09 DIAGNOSIS — M47812 Spondylosis without myelopathy or radiculopathy, cervical region: Secondary | ICD-10-CM | POA: Diagnosis not present

## 2024-02-09 DIAGNOSIS — G894 Chronic pain syndrome: Secondary | ICD-10-CM | POA: Diagnosis not present

## 2024-02-09 DIAGNOSIS — M4726 Other spondylosis with radiculopathy, lumbar region: Secondary | ICD-10-CM | POA: Diagnosis not present

## 2024-02-09 DIAGNOSIS — M47812 Spondylosis without myelopathy or radiculopathy, cervical region: Secondary | ICD-10-CM | POA: Diagnosis not present

## 2024-02-09 DIAGNOSIS — Z79891 Long term (current) use of opiate analgesic: Secondary | ICD-10-CM | POA: Diagnosis not present

## 2024-04-06 DIAGNOSIS — M47812 Spondylosis without myelopathy or radiculopathy, cervical region: Secondary | ICD-10-CM | POA: Diagnosis not present

## 2024-04-06 DIAGNOSIS — G894 Chronic pain syndrome: Secondary | ICD-10-CM | POA: Diagnosis not present

## 2024-04-06 DIAGNOSIS — Z79891 Long term (current) use of opiate analgesic: Secondary | ICD-10-CM | POA: Diagnosis not present

## 2024-04-06 DIAGNOSIS — M4726 Other spondylosis with radiculopathy, lumbar region: Secondary | ICD-10-CM | POA: Diagnosis not present
